# Patient Record
Sex: Female | Born: 1950 | Race: White | Hispanic: No | Marital: Married | Smoking: Never smoker
Health system: Southern US, Community
[De-identification: ages and names within clinical notes are randomized; demographics above are authoritative.]

## PROBLEM LIST (undated history)

## (undated) DIAGNOSIS — E785 Hyperlipidemia, unspecified: Secondary | ICD-10-CM

## (undated) DIAGNOSIS — E119 Type 2 diabetes mellitus without complications: Secondary | ICD-10-CM

## (undated) DIAGNOSIS — K635 Polyp of colon: Secondary | ICD-10-CM

## (undated) DIAGNOSIS — K219 Gastro-esophageal reflux disease without esophagitis: Secondary | ICD-10-CM

## (undated) DIAGNOSIS — L719 Rosacea, unspecified: Secondary | ICD-10-CM

## (undated) DIAGNOSIS — F329 Major depressive disorder, single episode, unspecified: Secondary | ICD-10-CM

## (undated) DIAGNOSIS — F419 Anxiety disorder, unspecified: Secondary | ICD-10-CM

## (undated) DIAGNOSIS — M719 Bursopathy, unspecified: Secondary | ICD-10-CM

## (undated) DIAGNOSIS — M1990 Unspecified osteoarthritis, unspecified site: Secondary | ICD-10-CM

## (undated) DIAGNOSIS — F32A Depression, unspecified: Secondary | ICD-10-CM

## (undated) HISTORY — PX: COLONOSCOPY: SHX174

## (undated) HISTORY — DX: Bursopathy, unspecified: M71.9

## (undated) HISTORY — DX: Type 2 diabetes mellitus without complications: E11.9

## (undated) HISTORY — DX: Anxiety disorder, unspecified: F41.9

## (undated) HISTORY — PX: ABDOMINAL HYSTERECTOMY: SHX81

## (undated) HISTORY — DX: Depression, unspecified: F32.A

## (undated) HISTORY — DX: Gastro-esophageal reflux disease without esophagitis: K21.9

## (undated) HISTORY — PX: BLADDER REPAIR: SHX76

## (undated) HISTORY — DX: Unspecified osteoarthritis, unspecified site: M19.90

## (undated) HISTORY — DX: Hyperlipidemia, unspecified: E78.5

## (undated) HISTORY — PX: BUNIONECTOMY: SHX129

## (undated) HISTORY — PX: BREAST BIOPSY: SHX20

## (undated) HISTORY — DX: Rosacea, unspecified: L71.9

## (undated) HISTORY — PX: DILATION AND CURETTAGE OF UTERUS: SHX78

---
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
## 2005-01-30 HISTORY — PX: COLON RESECTION: SHX5231

## 2016-01-31 DIAGNOSIS — K635 Polyp of colon: Secondary | ICD-10-CM

## 2016-01-31 HISTORY — DX: Polyp of colon: K63.5

## 2017-07-05 LAB — HEMOGLOBIN A1C: Hemoglobin A1C: 5.9

## 2017-07-05 LAB — HEPATIC FUNCTION PANEL
ALT: 26 (ref 7–35)
AST: 18 (ref 13–35)
Alkaline Phosphatase: 80 (ref 25–125)
Bilirubin, Total: 0.4

## 2017-07-05 LAB — LIPID PANEL
Cholesterol: 148 (ref 0–200)
HDL: 70 (ref 35–70)
LDL Cholesterol: 65
Triglycerides: 65 (ref 40–160)

## 2017-07-05 LAB — BASIC METABOLIC PANEL
BUN: 19 (ref 4–21)
Creatinine: 1 (ref <=1.1)
Glucose: 99
Potassium: 4.3 (ref 3.4–5.3)
Sodium: 142 (ref 137–147)

## 2017-07-05 LAB — CBC AND DIFFERENTIAL
HCT: 45 (ref 36–46)
Hemoglobin: 14.6 (ref 12.0–16.0)
Neutrophils Absolute: 4
Platelets: 239 (ref 150–399)
WBC: 6.6

## 2017-07-05 LAB — TSH: TSH: 2.56 (ref <=5.90)

## 2017-07-05 LAB — VITAMIN D 25 HYDROXY (VIT D DEFICIENCY, FRACTURES): Vit D, 25-Hydroxy: 30.7

## 2018-05-14 LAB — BASIC METABOLIC PANEL
BUN: 17 (ref 4–21)
Creatinine: 0.8 (ref <=1.1)
Glucose: 99
Potassium: 4.2 (ref 3.4–5.3)
Sodium: 141 (ref 137–147)

## 2018-05-14 LAB — CBC AND DIFFERENTIAL
HCT: 46 (ref 36–46)
Hemoglobin: 15.2 (ref 12.0–16.0)
Platelets: 219 (ref 150–399)
WBC: 5.4

## 2018-05-14 LAB — LIPID PANEL
Cholesterol: 143 (ref 0–200)
HDL: 78 — AB (ref 35–70)
LDL Cholesterol: 53
Triglycerides: 62 (ref 40–160)

## 2018-05-14 LAB — TSH: TSH: 2.13 (ref <=5.90)

## 2018-05-14 LAB — HEMOGLOBIN A1C: Hemoglobin A1C: 5.8

## 2018-05-14 LAB — HEPATIC FUNCTION PANEL
ALT: 17 (ref 7–35)
AST: 13 (ref 13–35)
Alkaline Phosphatase: 78 (ref 25–125)
Bilirubin, Total: 0.4

## 2018-05-14 LAB — VITAMIN D 25 HYDROXY (VIT D DEFICIENCY, FRACTURES): Vit D, 25-Hydroxy: 45.6

## 2018-06-10 ENCOUNTER — Encounter: Payer: Self-pay | Admitting: Family Medicine

## 2018-06-10 LAB — HM MAMMOGRAPHY

## 2018-08-28 ENCOUNTER — Other Ambulatory Visit: Payer: Self-pay

## 2018-08-28 ENCOUNTER — Encounter: Payer: Self-pay | Admitting: Family Medicine

## 2018-08-28 ENCOUNTER — Ambulatory Visit (INDEPENDENT_AMBULATORY_CARE_PROVIDER_SITE_OTHER): Payer: Medicare Other | Admitting: Family Medicine

## 2018-08-28 DIAGNOSIS — F419 Anxiety disorder, unspecified: Secondary | ICD-10-CM

## 2018-08-28 DIAGNOSIS — Z85828 Personal history of other malignant neoplasm of skin: Secondary | ICD-10-CM | POA: Diagnosis not present

## 2018-08-28 DIAGNOSIS — E118 Type 2 diabetes mellitus with unspecified complications: Secondary | ICD-10-CM

## 2018-08-28 DIAGNOSIS — M2550 Pain in unspecified joint: Secondary | ICD-10-CM

## 2018-08-28 DIAGNOSIS — K219 Gastro-esophageal reflux disease without esophagitis: Secondary | ICD-10-CM

## 2018-08-28 DIAGNOSIS — G4700 Insomnia, unspecified: Secondary | ICD-10-CM

## 2018-08-28 DIAGNOSIS — E785 Hyperlipidemia, unspecified: Secondary | ICD-10-CM

## 2018-08-28 DIAGNOSIS — E1169 Type 2 diabetes mellitus with other specified complication: Secondary | ICD-10-CM

## 2018-08-28 NOTE — Progress Notes (Signed)
Virtual Visit via Video Note  I connected with Renee Bauer   on 08/28/18 at  1:30 PM EDT by a video enabled telemedicine application and verified that I am speaking with the correct person using two identifiers.  Location patient: home Location provider:work office Persons participating in the virtual visit: patient, provider  I discussed the limitations of evaluation and management by telemedicine and the availability of in person appointments. The patient expressed understanding and agreed to proceed.  Video feed not working; visit completed by telephone  Torianne Bauer DOB: Jan 10, 1951 Encounter date: 08/28/2018  This is a 68 y.o. female who presents to establish care. Chief Complaint  Patient presents with  . Establish Care    History of present illness: From [place]. Just moved down here a month ago. Living with daughter (house build is behind schedule due to COVID). Dropped off record release today.   Dealing with a lot of anxiety over current move. Had lived up [place] "forever", lived close to sister (next door), left mom in nursing home, also just retired. Has dealt with depression/anxiety all of life. Has been on things on and off all life, but no meds in last few years. For years husband and her have wanted to move down by daughter to spend time with her and watch grandchild (who is autistic) grow up. Tired of snow.   Didn't even get to say goodbye to mother due to [place]. Now here, no friends.   Not supposed to move in to end of August. Still a lot of anxiety dealing with finalizing house building.   Only way she can sleep at night is if she takes alprazolam. Never been good sleeper. Lexapro, wellbutrin, prozac. Had really bad dry mouth on lexapro. Usually will sleep if she takes one of these. Feels guilty taking something for sleep or mood because she is christian and feels she should be able to work through things with prayer. Really did have issues with a lot of the  medications that she tried.   Last week got to point where she was just worn down with everything. Feels heaviness over her. Felt like she didn't want to live anymore. Tried to commit suicide at age 82. After that everything was strict. Then saw some psychologists which helped, but medications were difficult to tolerate.   Stopped medications when she had surgery a few years ago. About a year ago was going to [employer] which was helpful.   Some nights can sleep through with ambien. Feels like she is lucky if she gets 6 hours sleep.   Other thing she is anxious about is needing to get other medications refilled.   Also needs podiatrist. Goes to them to get toenails clipped; needs new orthotics.   Sees ortho - left knee injections q 3 months.   Follows with derm yearly for skin cancer checks. Has had 2 cancers on face. Not sure what type.   Also follows with rheumatologist. Has osteoarthritis. Takes meloxicam for pain below hip. Started seeing rheumatology due to dry mouth. Thought maybe lupus; but continued seeing rheumatology after this. Was getting injections that they told her she needed yearly. Not sure what this injection was.   DMII: has lost 30lbs in last couple of years. On steglatro which is newer med for her. Last bloodwork was done in April. A1C in April she is not sure about but was told pretty good.   HL: crestor 10mg . Does well with this.   Just had  mammogram.  Had DEXA not too long ago.  (above should be with records once we get these; she has signed release today)    Past Medical History:  Diagnosis Date  . Anxiety   . Benign colon polyp 2018  . Depression   . Diabetes mellitus without complication ([place])   . GERD (gastroesophageal reflux disease)   . Hyperlipidemia   . Rosacea    Past Surgical History:  Procedure Laterality Date  . ABDOMINAL HYSTERECTOMY     menorrhagia  . BLADDER REPAIR    . BUNIONECTOMY Right   . COLON RESECTION  2007   Not on  File Current Meds  Medication Sig  . CALCIUM CITRATE PO Take 600 mg by mouth daily.  . Cholecalciferol (VITAMIN D3 PO) Take 2,000 Units by mouth daily.  . Glucosamine-Chondroitin (GLUCOSAMINE CHONDR COMPLEX PO) Take by mouth.  . TURMERIC CURCUMIN PO Take 500 mg by mouth daily.   Social History   Tobacco Use  . Smoking status: Never Smoker  . Smokeless tobacco: Never Used  Substance Use Topics  . Alcohol use: Yes    Comment: occasional wine   Family History  Problem Relation Age of Onset  . Diabetes Mother   . Arrhythmia Mother   . High blood pressure Mother   . Mesothelioma Father        asbestos exposure  . Healthy Sister   . Brain cancer Maternal Aunt   . Post-traumatic stress disorder Son   . Scoliosis Son      Review of Systems  Constitutional: Negative for chills, fatigue and fever.  Respiratory: Negative for cough, chest tightness, shortness of breath and wheezing.   Cardiovascular: Negative for chest pain, palpitations and leg swelling.  Psychiatric/Behavioral: Positive for decreased concentration and sleep disturbance. Negative for suicidal ideas. The patient is nervous/anxious.     Objective:  There were no vitals taken for this visit.      BP Readings from Last 3 Encounters:  No data found for BP   Wt Readings from Last 3 Encounters:  No data found for Wt    EXAM:  GENERAL: alert, oriented, sounds well and in no acute distress  Lungs: no difficulty with breathing noted during conversation.  PSYCH/NEURO: pleasant and cooperative. She is occasionally tearful on the phone when discussing not being able to say by to her mom, in addition to other stressors going on.  Would not hurt self, but feels tired of all the stress/anxiety.   Assessment/Plan  1. Arthralgia, unspecified joint She has been following with orthopedics and rheumatology in [place].  She is not certain what the diagnosis is that she is following with rheumatology for, but she has  signed record release today so we will try to obtain previous records and can forward these to specialists once received. - Ambulatory referral to Orthopedics - Ambulatory referral to Rheumatology - meloxicam (MOBIC) 7.5 MG tablet; Take 1 tablet (7.5 mg total) by mouth daily as needed for pain.  Dispense: 90 tablet; Refill: 1  2. Controlled type 2 diabetes mellitus with complication, without long-term current use of insulin ([place]) States she has done well on Steglatro.  We will see how recent blood work looks once we receive records. - Ambulatory referral to Podiatry - STEGLATRO 15 MG TABS; Take 15 mg by mouth daily.  Dispense: 90 tablet; Refill: 1  3. History of skin cancer - Ambulatory referral to Dermatology  4. Hyperlipidemia associated with type 2 diabetes mellitus ([place]) - rosuvastatin (CRESTOR)  10 MG tablet; Take 1 tablet (10 mg total) by mouth at bedtime.  Dispense: 90 tablet; Refill: 1  5. Gastroesophageal reflux disease, esophagitis presence not specified - esomeprazole (NEXIUM) 40 MG capsule; Take 1 capsule (40 mg total) by mouth daily.  Dispense: 90 capsule; Refill: 3  6. Anxiety Current anxiety and stress levels are quite high.  She has had to make a lot of adjustments in the last couple months with retiring, moving, living with her daughter, and saying goodbye to mom after having to put her in a nursing home.  She is interested in doing therapy right now, which I think is a good start since she has had some difficulty tolerating medications in the past for mood.  We will start there, but I encouraged her to let me know if any worsening of mood.  We will plan a short-term follow-up to touch base.  She does limit alprazolam use.  She uses only intermittently at bedtime.  We discussed risks of this medication.  I do not wish to prescribe this long-term, but given her current status I am okay with a refill until we touch base. - ALPRAZolam (XANAX) 1 MG tablet; Take 0.5-1 tablets (0.5-1  mg total) by mouth at bedtime as needed for anxiety.  Dispense: 30 tablet; Refill: 0  7. Insomnia, unspecified type Discussed risk of tiredness Ambien that she was taking.  We will send in lower dose for safety reasons.  Cautioned that any dose of Ambien does come with risks.  Since she is used to this medication and does get some sleep on it we will keep it on board for now, but we discussed considering alternatives in the future especially as she ages that would be safer. - zolpidem (AMBIEN CR) 6.25 MG CR tablet; Take 1 tablet (6.25 mg total) by mouth at bedtime as needed for sleep.  Dispense: 90 tablet; Refill: 0    Return in about 2 months (around 10/29/2018) for Chronic condition visit. I will call her once I get previous records.   I discussed the assessment and treatment plan with the patient. The patient was provided an opportunity to ask questions and all were answered. The patient agreed with the plan and demonstrated an understanding of the instructions.   The patient was advised to call back or seek an in-person evaluation if the symptoms worsen or if the condition fails to improve as anticipated.  I provided 35 minutes of non-face-to-face time during this encounter.   Micheline Rough, MD

## 2018-08-29 ENCOUNTER — Telehealth: Payer: Self-pay | Admitting: *Deleted

## 2018-08-29 MED ORDER — ALPRAZOLAM 1 MG PO TABS: 0.5000 mg | ORAL_TABLET | Freq: Every evening | ORAL | 0 refills | Status: DC | PRN

## 2018-08-29 MED ORDER — STEGLATRO 15 MG PO TABS: 15.0000 mg | ORAL_TABLET | Freq: Every day | ORAL | 1 refills | Status: DC

## 2018-08-29 MED ORDER — ROSUVASTATIN CALCIUM 10 MG PO TABS: 10.0000 mg | ORAL_TABLET | Freq: Every day | ORAL | 1 refills | Status: DC

## 2018-08-29 MED ORDER — MELOXICAM 7.5 MG PO TABS: 7.5000 mg | ORAL_TABLET | Freq: Every day | ORAL | 1 refills | Status: DC | PRN

## 2018-08-29 MED ORDER — ESOMEPRAZOLE MAGNESIUM 40 MG PO CPDR: 40.0000 mg | DELAYED_RELEASE_CAPSULE | Freq: Every day | ORAL | 3 refills | Status: DC

## 2018-08-29 MED ORDER — ZOLPIDEM TARTRATE ER 6.25 MG PO TBCR: 6.2500 mg | EXTENDED_RELEASE_TABLET | Freq: Every evening | ORAL | 0 refills | Status: DC | PRN

## 2018-08-29 NOTE — Telephone Encounter (Signed)
I called the pt and offered to give the phone number for [facility].  Patient stated she is at a pool and cannot write the number down, I advised she google the office and she agreed.  Patient stated she will call back for a follow up visit.  PHQ9 and GAD7 entered into the chart.  Message sent to Dr Ethlyn Gallery.

## 2018-08-29 NOTE — Telephone Encounter (Signed)
-----   Message from Caren Macadam, MD sent at 08/29/2018  7:36 AM EDT ----- Please give her therapy numbers we have: she has [facility] medicare but also supplement if that makes difference. I also told her she could call card.#I think 2 month [facility]CV follow up is reasonable to touch base with concerns discussed today#if you have time and could do PHQ9/gad7 send results back to me. If not then we can accomplish this at her follow up visit.

## 2018-08-30 NOTE — Telephone Encounter (Signed)
Noted  

## 2018-09-09 NOTE — Telephone Encounter (Signed)
Copied from [place] [id number]. Topic: Referral - Request for Referral >> Sep 09, 2018 10:26 AM Parke Poisson wrote: Has patient seen PCP for this complaint Yes Referral for which specialty:Ortho Preferred provider/office: No preferance Reason for referral: left knee

## 2018-09-12 ENCOUNTER — Encounter: Payer: Self-pay | Admitting: Rheumatology

## 2018-09-23 ENCOUNTER — Encounter: Payer: Self-pay | Admitting: Family Medicine

## 2018-09-25 ENCOUNTER — Encounter: Payer: Self-pay | Admitting: Family Medicine

## 2018-09-25 NOTE — Telephone Encounter (Signed)
Easiest option:  meloxicam is not expensive and so she could fill locally not on insurance- I can print rx- - runs about $2.50-5 depending on pharmacy ($3 at [facility] for 30 days or $6 for 90 day). So if she is wanting larger supply, this might be easier way to do it.   Doesn't make sense to me to complete prior auth paperwork with above cost of medication being available.

## 2018-09-27 ENCOUNTER — Other Ambulatory Visit: Payer: Self-pay | Admitting: Family Medicine

## 2018-09-27 DIAGNOSIS — M2550 Pain in unspecified joint: Secondary | ICD-10-CM

## 2018-09-27 MED ORDER — MELOXICAM 7.5 MG PO TABS: 7.5000 mg | ORAL_TABLET | Freq: Two times a day (BID) | ORAL | 1 refills | Status: DC | PRN

## 2018-09-30 ENCOUNTER — Encounter: Payer: Self-pay | Admitting: Family Medicine

## 2018-09-30 DIAGNOSIS — M8580 Other specified disorders of bone density and structure, unspecified site: Secondary | ICD-10-CM | POA: Insufficient documentation

## 2018-10-08 ENCOUNTER — Other Ambulatory Visit: Payer: Self-pay

## 2018-10-08 ENCOUNTER — Ambulatory Visit (INDEPENDENT_AMBULATORY_CARE_PROVIDER_SITE_OTHER): Payer: Medicare Other | Admitting: Orthopaedic Surgery

## 2018-10-08 ENCOUNTER — Telehealth: Payer: Self-pay | Admitting: Orthopaedic Surgery

## 2018-10-08 ENCOUNTER — Ambulatory Visit: Payer: Self-pay

## 2018-10-08 ENCOUNTER — Encounter: Payer: Self-pay | Admitting: Orthopaedic Surgery

## 2018-10-08 VITALS — BP 128/87 | HR 74 | Ht 61.0 in | Wt 155.0 lb

## 2018-10-08 DIAGNOSIS — M1712 Unilateral primary osteoarthritis, left knee: Secondary | ICD-10-CM | POA: Diagnosis not present

## 2018-10-08 MED ORDER — BUPIVACAINE HCL 0.25 % IJ SOLN
2.0000 mL | INTRAMUSCULAR | Status: AC | PRN
  Administered 2018-10-08: 2 mL via INTRA_ARTICULAR

## 2018-10-08 MED ORDER — LIDOCAINE HCL 1 % IJ SOLN
2.0000 mL | INTRAMUSCULAR | Status: AC | PRN
  Administered 2018-10-08: 15:00:00 2 mL

## 2018-10-08 MED ORDER — METHYLPREDNISOLONE ACETATE 40 MG/ML IJ SUSP
80.0000 mg | INTRAMUSCULAR | Status: AC | PRN
  Administered 2018-10-08: 80 mg via INTRA_ARTICULAR

## 2018-10-08 NOTE — Telephone Encounter (Signed)
Please precert for left knee visco injections. This is Dr.Whitfield's patient.

## 2018-10-08 NOTE — Progress Notes (Signed)
Office Visit Note   Patient: Renee Bauer           Date of Birth: 04-Nov-1950           MRN: [id number] Visit Date: 10/08/2018              Requested by: Caren Macadam, MD [place],  [place] [id number] PCP: Caren Macadam, MD   Assessment & Plan: Visit Diagnoses:  1. Unilateral primary osteoarthritis, left knee     Plan:  #1: Corticosteroid injection was given without difficulty.  Tolerated procedure well. #2: Instructed her about the use of Voltaren gel. #3: We will have her precertified for Visco supplementation.   #4: Follow back up after Visco precertification.  Follow-Up Instructions: Return if symptoms worsen or fail to improve.   Orders:  Orders Placed This Encounter  Procedures  . XR KNEE 3 VIEW LEFT   No orders of the defined types were placed in this encounter.     Procedures: Large Joint Inj: L knee on 10/08/2018 2:38 PM Indications: pain and diagnostic evaluation Details: 25 G 1.5 in needle, anteromedial approach  Arthrogram: No  Medications: 2 mL lidocaine 1 %; 80 mg methylPREDNISolone acetate 40 MG/ML; 2 mL bupivacaine 0.25 % Outcome: tolerated well, no immediate complications Procedure, treatment alternatives, risks and benefits explained, specific risks discussed. Consent was given by the patient. Immediately prior to procedure a time out was called to verify the correct patient, procedure, equipment, support staff and site/side marked as required. Patient was prepped and draped in the usual sterile fashion.       Clinical Data: No additional findings.   Subjective: Chief Complaint  Patient presents with  . Left Knee - Pain   HPI  Patient presents today for chronic left knee pain. She just moved to the area from [place] and usually gets cortisone injections  every 3 months. She said that her knee hurts all throughout. Her pain is constant, but gets worse with certain movements. She takes Meloxicam for arthritis  pain. Her last cortisone injection was June 2nd of this year.    Review of Systems  Constitutional: Negative for fatigue.  HENT: Negative for ear pain.   Respiratory: Negative for shortness of breath.   Cardiovascular: Negative for leg swelling.  Gastrointestinal: Negative for constipation and diarrhea.  Endocrine: Negative for cold intolerance and heat intolerance.  Genitourinary: Negative for difficulty urinating.  Musculoskeletal: Negative for joint swelling.  Skin: Negative for rash.  Allergic/Immunologic: Negative for food allergies.  Neurological: Negative for weakness.  Hematological: Does not bruise/bleed easily.  Psychiatric/Behavioral: Positive for sleep disturbance.     Objective: Vital Signs: BP 128/87   Pulse 74   Ht 5\' 1"  (1.549 m)   Wt 155 lb (70.3 kg)   BMI 29.29 kg/m   Physical Exam Constitutional:      Appearance: She is well-developed.  Eyes:     Pupils: Pupils are equal, round, and reactive to light.  Pulmonary:     Effort: Pulmonary effort is normal.  Skin:    General: Skin is warm and dry.  Neurological:     Mental Status: She is alert and oriented to person, place, and time.  Psychiatric:        Behavior: Behavior normal.     Ortho Exam  Ortho exam today reveals minimal effusion.  No warmth or erythema.  Patellofemoral crepitance with range of motion.  Some pseudolaxity with valgus stressing but has a good endpoint.  Range of motion from near full extension to 100 degrees of flexion.    Specialty Comments:  No specialty comments available.  Imaging: Xr Knee 3 View Left  Result Date: 10/08/2018 Three-view x-ray of the left knee reveals marked joint space narrowing of the medial compartment with sclerosing of both the tibia and femur.  She also has significant patellofemoral degenerative changes with joint space narrowing also.  Slight translation of the distal femur on the tibia.    PMFS History: Current Outpatient Medications   Medication Sig Dispense Refill  . ALPRAZolam (XANAX) 1 MG tablet Take 0.5-1 tablets (0.5-1 mg total) by mouth at bedtime as needed for anxiety. 30 tablet 0  . CALCIUM CITRATE PO Take 600 mg by mouth daily.    . Cholecalciferol (VITAMIN D3 PO) Take 2,000 Units by mouth daily.    Marland Kitchen esomeprazole (NEXIUM) 40 MG capsule Take 1 capsule (40 mg total) by mouth daily. 90 capsule 3  . fluticasone (FLONASE) 50 MCG/ACT nasal spray     . Glucosamine-Chondroitin (GLUCOSAMINE CHONDR COMPLEX PO) Take by mouth.    . lidocaine-prilocaine (EMLA) cream     . meloxicam (MOBIC) 7.5 MG tablet Take 1 tablet (7.5 mg total) by mouth 2 (two) times daily as needed for pain. 180 tablet 1  . METRONIDAZOLE, TOPICAL, 0.75 % LOTN     . rosuvastatin (CRESTOR) 10 MG tablet Take 1 tablet (10 mg total) by mouth at bedtime. 90 tablet 1  . STEGLATRO 15 MG TABS Take 15 mg by mouth daily. 90 tablet 1  . TURMERIC CURCUMIN PO Take 500 mg by mouth daily.    Marland Kitchen zolpidem (AMBIEN CR) 6.25 MG CR tablet Take 1 tablet (6.25 mg total) by mouth at bedtime as needed for sleep. 90 tablet 0   No current facility-administered medications for this visit.     Patient Active Problem List   Diagnosis Date Noted  . Osteopenia 09/30/2018   Past Medical History:  Diagnosis Date  . Anxiety   . Benign colon polyp 2018  . Depression   . Diabetes mellitus without complication ([place])   . GERD (gastroesophageal reflux disease)   . Hyperlipidemia   . Rosacea     Family History  Problem Relation Age of Onset  . Diabetes Mother   . Arrhythmia Mother   . High blood pressure Mother   . Mesothelioma Father        asbestos exposure  . Healthy Sister   . Brain cancer Maternal Aunt   . Post-traumatic stress disorder Son   . Scoliosis Son     Past Surgical History:  Procedure Laterality Date  . ABDOMINAL HYSTERECTOMY     menorrhagia  . BLADDER REPAIR    . BUNIONECTOMY Right   . COLON RESECTION  2007  . DILATION AND CURETTAGE OF UTERUS      Social History   Occupational History  . Not on file  Tobacco Use  . Smoking status: Never Smoker  . Smokeless tobacco: Never Used  Substance and Sexual Activity  . Alcohol use: Yes    Comment: occasional wine  . Drug use: Never  . Sexual activity: Not on file    Dorsal displacement of the dorsal fragment

## 2018-10-11 NOTE — Telephone Encounter (Signed)
Noted  

## 2018-10-15 ENCOUNTER — Encounter: Payer: Self-pay | Admitting: Family Medicine

## 2018-10-15 ENCOUNTER — Telehealth: Payer: Self-pay

## 2018-10-15 NOTE — Telephone Encounter (Signed)
Submitted VOB for Synvisc series, left knee. 

## 2018-10-22 ENCOUNTER — Other Ambulatory Visit: Payer: Self-pay

## 2018-10-22 ENCOUNTER — Encounter: Payer: Self-pay | Admitting: Podiatry

## 2018-10-22 ENCOUNTER — Other Ambulatory Visit: Payer: Self-pay | Admitting: Podiatry

## 2018-10-22 ENCOUNTER — Ambulatory Visit (INDEPENDENT_AMBULATORY_CARE_PROVIDER_SITE_OTHER): Payer: Medicare Other | Admitting: Podiatry

## 2018-10-22 ENCOUNTER — Ambulatory Visit (INDEPENDENT_AMBULATORY_CARE_PROVIDER_SITE_OTHER): Payer: Medicare Other

## 2018-10-22 VITALS — BP 123/87 | HR 81

## 2018-10-22 DIAGNOSIS — M79672 Pain in left foot: Secondary | ICD-10-CM

## 2018-10-22 DIAGNOSIS — M19071 Primary osteoarthritis, right ankle and foot: Secondary | ICD-10-CM

## 2018-10-22 DIAGNOSIS — R200 Anesthesia of skin: Secondary | ICD-10-CM

## 2018-10-22 DIAGNOSIS — G5752 Tarsal tunnel syndrome, left lower limb: Secondary | ICD-10-CM | POA: Diagnosis not present

## 2018-10-22 DIAGNOSIS — M7752 Other enthesopathy of left foot: Secondary | ICD-10-CM | POA: Diagnosis not present

## 2018-10-22 DIAGNOSIS — M79671 Pain in right foot: Secondary | ICD-10-CM

## 2018-10-22 DIAGNOSIS — M7750 Other enthesopathy of unspecified foot: Secondary | ICD-10-CM

## 2018-10-22 MED ORDER — GABAPENTIN 100 MG PO CAPS: 100.0000 mg | ORAL_CAPSULE | Freq: Three times a day (TID) | ORAL | 1 refills | Status: DC

## 2018-10-23 NOTE — Progress Notes (Signed)
Subjective:  Patient ID: Renee Bauer, female    DOB: 1950-02-29,  MRN: [id number]  Chief Complaint  Patient presents with  . Foot Problem    Right digits 1-3 going numb, 3 months duration. Pt states may be due to injury on right 2nd toe after she fell and bent a pin she had from a surgery.  . Foot Problem    Left medial foot constant tingling, worse when flexed lateral    68 y.o. female presents with the above complaint. Patient with last A1c of 5.8 presents with left tingling/burning sensation to the left foot and numbness to the right digits 1-3 doing numb. She also has secondary complaints of elongated toenails. Denies any history of trauma to both feet. She does have history of surgery to the right first and second digit (likely bunion/hammertoe)   Review of Systems: Negative except as noted in the HPI. Denies N/V/F/Ch.  Past Medical History:  Diagnosis Date  . Anxiety   . Benign colon polyp 2018  . Depression   . Diabetes mellitus without complication ([place])   . GERD (gastroesophageal reflux disease)   . Hyperlipidemia   . Rosacea     Current Outpatient Medications:  .  ALPRAZolam (XANAX) 1 MG tablet, Take 0.5-1 tablets (0.5-1 mg total) by mouth at bedtime as needed for anxiety., Disp: 30 tablet, Rfl: 0 .  CALCIUM CITRATE PO, Take 600 mg by mouth daily., Disp: , Rfl:  .  Cholecalciferol (VITAMIN D3 PO), Take 2,000 Units by mouth daily., Disp: , Rfl:  .  esomeprazole (NEXIUM) 40 MG capsule, Take 1 capsule (40 mg total) by mouth daily., Disp: 90 capsule, Rfl: 3 .  fluticasone (FLONASE) 50 MCG/ACT nasal spray, , Disp: , Rfl:  .  Glucosamine-Chondroitin (GLUCOSAMINE CHONDR COMPLEX PO), Take by mouth., Disp: , Rfl:  .  lidocaine-prilocaine (EMLA) cream, , Disp: , Rfl:  .  meloxicam (MOBIC) 7.5 MG tablet, Take 1 tablet (7.5 mg total) by mouth 2 (two) times daily as needed for pain., Disp: 180 tablet, Rfl: 1 .  METRONIDAZOLE, TOPICAL, 0.75 % LOTN, , Disp: , Rfl:  .  rosuvastatin  (CRESTOR) 10 MG tablet, Take 1 tablet (10 mg total) by mouth at bedtime., Disp: 90 tablet, Rfl: 1 .  STEGLATRO 15 MG TABS, Take 15 mg by mouth daily., Disp: 90 tablet, Rfl: 1 .  TURMERIC CURCUMIN PO, Take 500 mg by mouth daily., Disp: , Rfl:  .  zolpidem (AMBIEN CR) 6.25 MG CR tablet, Take 1 tablet (6.25 mg total) by mouth at bedtime as needed for sleep., Disp: 90 tablet, Rfl: 0 .  gabapentin (NEURONTIN) 100 MG capsule, Take 1 capsule (100 mg total) by mouth 3 (three) times daily., Disp: 90 capsule, Rfl: 1  Social History   Tobacco Use  Smoking Status Never Smoker  Smokeless Tobacco Never Used    Allergies  Allergen Reactions  . Clindamycin Hcl Rash   Objective:   Vitals:   10/22/18 1637  BP: 123/87  Pulse: 81   There is no height or weight on file to calculate BMI. Constitutional Well developed. Well nourished.  Vascular Dorsalis pedis pulses palpable bilaterally. Posterior tibial pulses palpable bilaterally. Capillary refill normal to all digits.  No cyanosis or clubbing noted. Pedal hair growth normal.  Neurologic Normal speech. Oriented to person, place, and time. Right numbness noted to the plantar aspect of the first and second digit. Negative Tinel signs B/L feet   Dermatologic Nails elongated and slightly thickened x 10 No open  wounds. No skin lesions.  Orthopedic: Pain on palpation to the tarsal tunnel. Negative Tinel signs. No pain along the PT tendon or with flexion of the FDL/FHL.    Radiographs: There is no definitive evidence of acute fracture or stress fracture identified at this time. Joint space is maintained. Bone stock within normal limits on the left foot.  Right foot shows previous correction of bunion deformity and hammertoe with hardware intact. 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  Assessment:   1. Pain in left foot   2. Pain in right foot   3. Tarsal tunnel syndrome, left   4. Numbness in feet    Plan:  Patient was evaluated  and treated and all questions answered.   -Radiographs were reviewed. B/L Xray.  See above. -Bilateral toenail trims were performed x 10.  -After obtaining consent, injection of 1.5 cc of 0.5% marcaine and 1.5 cc 1% lidocaine given by Felipa Furnace to the tarsal tunnel of the left foot.  -Pt was instructed to follow up in 3 weeks and to report if the injection gave any relief. If so, will consider getting MRI on the next visit of the left foot. -Ankle brace (Tri-lok) was dispensed to the left foot for tarsal tunnel and ankle stabilization gabapentin 100mg  was prescribed to help alleviate the neuropathic pain.   Return in about 3 weeks (around 11/12/2018).

## 2018-10-24 ENCOUNTER — Encounter: Payer: Self-pay | Admitting: Orthopaedic Surgery

## 2018-10-24 ENCOUNTER — Telehealth: Payer: Self-pay

## 2018-10-24 NOTE — Telephone Encounter (Signed)
Please schedule patient an appointment with Dr. Durward Fortes for gel injection.  Thank you.  Approved for Synvisc series, left knee. Buy & Bill Covered at 100% of the allowed amount No Co-pay No PA required

## 2018-10-29 NOTE — Telephone Encounter (Signed)
LMOM for patient to call office to schedule appt 

## 2018-10-30 ENCOUNTER — Other Ambulatory Visit: Payer: Self-pay

## 2018-10-30 ENCOUNTER — Telehealth (INDEPENDENT_AMBULATORY_CARE_PROVIDER_SITE_OTHER): Payer: Medicare Other | Admitting: Family Medicine

## 2018-10-30 DIAGNOSIS — E785 Hyperlipidemia, unspecified: Secondary | ICD-10-CM

## 2018-10-30 DIAGNOSIS — E1142 Type 2 diabetes mellitus with diabetic polyneuropathy: Secondary | ICD-10-CM | POA: Diagnosis not present

## 2018-10-30 DIAGNOSIS — E1169 Type 2 diabetes mellitus with other specified complication: Secondary | ICD-10-CM

## 2018-10-30 DIAGNOSIS — M1990 Unspecified osteoarthritis, unspecified site: Secondary | ICD-10-CM

## 2018-10-30 DIAGNOSIS — M8580 Other specified disorders of bone density and structure, unspecified site: Secondary | ICD-10-CM

## 2018-10-30 DIAGNOSIS — Z85828 Personal history of other malignant neoplasm of skin: Secondary | ICD-10-CM | POA: Insufficient documentation

## 2018-10-30 DIAGNOSIS — R350 Frequency of micturition: Secondary | ICD-10-CM

## 2018-10-30 DIAGNOSIS — G5793 Unspecified mononeuropathy of bilateral lower limbs: Secondary | ICD-10-CM

## 2018-10-30 DIAGNOSIS — G4700 Insomnia, unspecified: Secondary | ICD-10-CM

## 2018-10-30 DIAGNOSIS — F419 Anxiety disorder, unspecified: Secondary | ICD-10-CM

## 2018-10-30 DIAGNOSIS — E1149 Type 2 diabetes mellitus with other diabetic neurological complication: Secondary | ICD-10-CM | POA: Insufficient documentation

## 2018-10-30 DIAGNOSIS — E538 Deficiency of other specified B group vitamins: Secondary | ICD-10-CM

## 2018-10-30 DIAGNOSIS — Z1159 Encounter for screening for other viral diseases: Secondary | ICD-10-CM

## 2018-10-30 DIAGNOSIS — E559 Vitamin D deficiency, unspecified: Secondary | ICD-10-CM

## 2018-10-30 MED ORDER — ZOLPIDEM TARTRATE ER 6.25 MG PO TBCR: 6.2500 mg | EXTENDED_RELEASE_TABLET | Freq: Every evening | ORAL | 1 refills | Status: DC | PRN

## 2018-10-30 NOTE — Progress Notes (Deleted)
Virtual Visit via Telephone Note  I connected with@ on 10/30/18 at 10:00 AM EDT by telephone and verified that I am speaking with the correct person using two identifiers.   I discussed the limitations, risks, security and privacy concerns of performing an evaluation and management service by telephone and the availability of in person appointments. I also discussed with the patient that there may be a patient responsible charge related to this service. The patient expressed understanding and agreed to proceed.  Location patient: home Location provider: work office Participants present for the call: patient, provider Patient did not have a visit in the prior 7 days to address this/these issue(s).  Scheduled concerns:  Hepatitis C Screening Urine Microalbumin Dexa Scan Pna Vac Low Risk Adult Influenza Vaccine  History of Present Illness: Last visit was virtual establish care 7/29. At that time discussed increased stress with recent move from [place].   We referred to orthopedics as well as rheumatology since she had been following with them regularly for various joint pains.  She has seen Ortho and has had a left knee steroid injection.  She is getting recertified for Visco supplementation.  We referred to podiatry due to diabetes and needing help with nail trimming.  She has seen Dr. Posey Pronto.  She was started on gabapentin to help with neuropathic pain.   We referred to dermatology for history of skin cancer.  We discussed considering therapy to help with ongoing anxiety.  We discussed using lower dose of Ambien to help with sleep since this would be safer due to age.  I did refill her Xanax as well, since she uses this for sleep when anxiety is significant. Observations/Objective: Patient sounds cheerful and well on the phone. I do not appreciate any SOB. Speech and thought processing are grossly intact. Patient reported vitals:  Assessment and Plan:   Follow Up  Instructions:  @   [id number] 5-10 [id number] 11-20 [id number] 21-30 I did not refer this patient for an OV in the next 24 hours for this/these issue(s).  I discussed the assessment and treatment plan with the patient. The patient was provided an opportunity to ask questions and all were answered. The patient agreed with the plan and demonstrated an understanding of the instructions.   The patient was advised to call back or seek an in-person evaluation if the symptoms worsen or if the condition fails to improve as anticipated.  I provided *** minutes of non-face-to-face time during this encounter.   Micheline Rough, MD

## 2018-10-31 ENCOUNTER — Encounter: Payer: Self-pay | Admitting: Orthopaedic Surgery

## 2018-10-31 ENCOUNTER — Ambulatory Visit (INDEPENDENT_AMBULATORY_CARE_PROVIDER_SITE_OTHER): Payer: Medicare Other | Admitting: Orthopaedic Surgery

## 2018-10-31 ENCOUNTER — Other Ambulatory Visit: Payer: Self-pay

## 2018-10-31 ENCOUNTER — Telehealth: Payer: Self-pay | Admitting: *Deleted

## 2018-10-31 DIAGNOSIS — M1712 Unilateral primary osteoarthritis, left knee: Secondary | ICD-10-CM

## 2018-10-31 DIAGNOSIS — M8580 Other specified disorders of bone density and structure, unspecified site: Secondary | ICD-10-CM

## 2018-10-31 MED ORDER — HYLAN G-F 20 16 MG/2ML IX SOSY
16.0000 mg | PREFILLED_SYRINGE | INTRA_ARTICULAR | Status: AC | PRN
  Administered 2018-10-31: 16 mg via INTRA_ARTICULAR

## 2018-10-31 NOTE — Telephone Encounter (Signed)
-----   Message from Caren Macadam, MD sent at 10/30/2018  5:14 PM EDT ----- Please schedule pneumococcal 23 along with upcoming flu shot and bloodwork. She states she had tetanus vaccine (not sure type) in June 2018 if you can put in HM. Please ask her if she would like to change specialty referral for bone density. Rheum here doesn't treat bone density? Can switch to endocrinology if desired? Didn't look like rheum did other things for her besides this.

## 2018-10-31 NOTE — Progress Notes (Signed)
Office Visit Note   Patient: Renee Bauer           Date of Birth: September 30, 1950           MRN: [id number] Visit Date: 10/31/2018              Requested by: Caren Macadam, MD [place],  [place] [id number] PCP: Caren Macadam, MD   Assessment & Plan: Visit Diagnoses:  1. Unilateral primary osteoarthritis, left knee     Plan: First Synvisc injection left knee.  Return weekly for the next 2 weeks to complete the series  Follow-Up Instructions: Return in about 1 week (around 11/07/2018).   Orders:  Orders Placed This Encounter  Procedures  . Large Joint Inj: L knee   No orders of the defined types were placed in this encounter.     Procedures: Large Joint Inj: L knee on 10/31/2018 3:37 PM Indications: pain and joint swelling Details: 25 G 1.5 in needle, anteromedial approach  Arthrogram: No  Medications: 16 mg Hylan 16 MG/2ML Outcome: tolerated well, no immediate complications Procedure, treatment alternatives, risks and benefits explained, specific risks discussed. Consent was given by the patient. Immediately prior to procedure a time out was called to verify the correct patient, procedure, equipment, support staff and site/side marked as required. Patient was prepped and draped in the usual sterile fashion.       Clinical Data: No additional findings.   Subjective: Chief Complaint  Patient presents with  . Left Knee - Follow-up    Synvisc started 10/31/2018  Patient presents today for the first Synvisc injection in her left knee.  No change in symptoms.  HPI  Review of Systems   Objective: Vital Signs: BP 117/82   Pulse 74   Ht 5\' 1"  (1.549 m)   Wt 155 lb (70.3 kg)   BMI 29.29 kg/m   Physical Exam  Ortho Exam left knee was not hot, warm or red.  No effusion.  Predominately medial joint pain.  Prior films demonstrated marked medial joint space narrowing as well as degenerative changes at the patellofemoral joint and the  lateral compartment  Specialty Comments:  No specialty comments available.  Imaging: No results found.   PMFS History: Patient Active Problem List   Diagnosis Date Noted  . Unilateral primary osteoarthritis, left knee 10/31/2018  . Arthritis 10/30/2018  . DM (diabetes mellitus) type II controlled, neurological manifestation ([place]) 10/30/2018  . Neuropathy of both feet 10/30/2018  . Anxiety 10/30/2018  . Insomnia 10/30/2018  . History of skin cancer 10/30/2018  . Osteopenia 09/30/2018   Past Medical History:  Diagnosis Date  . Anxiety   . Benign colon polyp 2018  . Depression   . Diabetes mellitus without complication ([place])   . GERD (gastroesophageal reflux disease)   . Hyperlipidemia   . Rosacea     Family History  Problem Relation Age of Onset  . Diabetes Mother   . Arrhythmia Mother   . High blood pressure Mother   . Mesothelioma Father        asbestos exposure  . Healthy Sister   . Brain cancer Maternal Aunt   . Post-traumatic stress disorder Son   . Scoliosis Son     Past Surgical History:  Procedure Laterality Date  . ABDOMINAL HYSTERECTOMY     menorrhagia  . BLADDER REPAIR    . BUNIONECTOMY Right   . COLON RESECTION  2007  . DILATION AND CURETTAGE OF UTERUS  Social History   Occupational History  . Not on file  Tobacco Use  . Smoking status: Never Smoker  . Smokeless tobacco: Never Used  Substance and Sexual Activity  . Alcohol use: Yes    Comment: occasional wine  . Drug use: Never  . Sexual activity: Not on file

## 2018-10-31 NOTE — Telephone Encounter (Signed)
I called the pt and she stated she already spoke with someone and scheduled the appts.  She stated the tetanus was done on 6/202018, not sure of specific type and this was entered under Td in her chart.  She stated her last bone density was in 2019 and she would like to see an endocrinologist and the message was forwarded to Dr Ethlyn Gallery.

## 2018-10-31 NOTE — Progress Notes (Signed)
Virtual Visit via Telephone Note  I connected with Renee Bauer  on 10/30/18 at 10:00 AM EDT by telephone and verified that I am speaking with the correct person using two identifiers.   I discussed the limitations, risks, security and privacy concerns of performing an evaluation and management service by telephone and the availability of in person appointments. I also discussed with the patient that there may be a patient responsible charge related to this service. The patient expressed understanding and agreed to proceed.  Location patient: home Location provider: work office Participants present for the call: patient, provider Patient did not have a visit in the prior 7 days to address this/these issue(s).  Scheduled concerns:  Hepatitis C Screening Urine Microalbumin Dexa Scan Pna Vac Low Risk Adult Influenza Vaccine  History of Present Illness:  Last visit was virtual establish care 7/29. At that time discussed increased stress with recent move from [place]. Still not in new house. Hoping to get in there in October. Does feel like she will feel better once she is in her own space and is done with decisions and stress of building.   We referred to orthopedics as well as rheumatology since she had been following with them regularly for various joint pains and then seeing rheumatology for reclast injections/bone density monitoring (getting reclast every 2 years per last note). .  She has seen Ortho and has had a left knee steroid injection.  She is getting recertified for Visco supplementation. Knee has been doing pretty well since shot. Got call yesterday that insurance approved gel shot.   We referred to podiatry due to diabetes and needing help with nail trimming.  She has seen Dr. Posey Pronto.  She was started on gabapentin to help with neuropathic pain. States that a few weeks ago was having a lot of foot pain. Right foot going numb. Left foot below ankle tingling. A lot of tingling all  the time. Tries to go to sleep and having numbness/pain. Read side effect of steglatro and worried about amputation, so stopped taking this. Not been checking blood sugars. Was told in past she was  More borderline. Just started the gabapentin.    We referred to dermatology for history of skin cancer. Has appointment in October.   We discussed considering therapy to help with ongoing anxiety.  We discussed using lower dose of Ambien to help with sleep since this would be safer due to age.  I did refill her Xanax as well, since she uses this for sleep when anxiety is significant, but she stopped taking this after our last discussion. Was difficult for her to stop, but sleeping 6 hours usually. Has decreased ambien to just once a week.   Energy level has been good. Joined a gym and working out 3 days/week. Trying to walk an hour/day.   Last colonoscopy 05/2016: found a few diverticula and had polyp biopsy. Was supposed to repeat in 5 years. Couple of weeks ago was constipated, then had bad episode diarrhea. Then happened again.   Observations/Objective: Patient sounds cheerful and well on the phone. I do not appreciate any SOB. Speech and thought processing are grossly intact. Patient reported vitals:  Assessment and Plan:  1. Arthritis Following with ortho; has upcoming appointment with rheumatology as well.   2. Controlled type 2 diabetes mellitus with diabetic polyneuropathy, without long-term current use of insulin ([place]) Will start with bloodwork and then determine if she needs to restart any medications.  - Hemoglobin A1c; Future -  Comprehensive metabolic panel; Future - Microalbumin / creatinine urine ratio; Future  3. Neuropathy of both feet Taking gabapentin currently - CBC with Differential/Platelet; Future - Magnesium, RBC; Future  4. Anxiety Stable; has taken self off xanax which is great.  5. Insomnia, unspecified type Sleeping better. Intermittent ambien use still, but  much les frequent.  6. History of skin cancer Seeing dermatology.  7. Urinary frequency Will check urine with upcoming bloodwork. - Urinalysis; Future  8. Hyperlipidemia associated with type 2 diabetes mellitus ([place]) - Lipid panel; Future - TSH; Future  9. B12 deficiency - Vitamin B12; Future  10. Vitamin D deficiency - VITAMIN D 25 Hydroxy (Vit-D Deficiency, Fractures); Future  11. Encounter for hepatitis C screening test for low risk patient - Hep C Antibody; Future  12. Osteopenia: has been getting reclast injections every other year. Followed by rheumatology in [place]. We will see if she would like to change specialists to be seeing someone specifically for bone density. Last DEXA was 02/17/17: L1-L4 T score -0.6; L fem neck -1.3, R fem neck -1.3. hx of taking boniva. We will see if she wants to see another specialist for monitoring since after calling our rheumatologists here do not regularly treat bone density issues.   Follow Up Instructions: Return for pending bloodwork results.  I did not refer this patient for an OV in the next 24 hours for this/these issue(s).  I discussed the assessment and treatment plan with the patient. The patient was provided an opportunity to ask questions and all were answered. The patient agreed with the plan and demonstrated an understanding of the instructions.   The patient was advised to call back or seek an in-person evaluation if the symptoms worsen or if the condition fails to improve as anticipated.  I provided 35 minutes of non-face-to-face time during this encounter.   Micheline Rough, MD

## 2018-11-01 NOTE — Telephone Encounter (Signed)
Referral placed.

## 2018-11-01 NOTE — Telephone Encounter (Signed)
Please put in endo referral. thanks

## 2018-11-04 ENCOUNTER — Other Ambulatory Visit (INDEPENDENT_AMBULATORY_CARE_PROVIDER_SITE_OTHER): Payer: Medicare Other

## 2018-11-04 ENCOUNTER — Other Ambulatory Visit: Payer: Self-pay

## 2018-11-04 DIAGNOSIS — E559 Vitamin D deficiency, unspecified: Secondary | ICD-10-CM | POA: Diagnosis not present

## 2018-11-04 DIAGNOSIS — E1169 Type 2 diabetes mellitus with other specified complication: Secondary | ICD-10-CM | POA: Diagnosis not present

## 2018-11-04 DIAGNOSIS — R350 Frequency of micturition: Secondary | ICD-10-CM

## 2018-11-04 DIAGNOSIS — E785 Hyperlipidemia, unspecified: Secondary | ICD-10-CM

## 2018-11-04 DIAGNOSIS — E538 Deficiency of other specified B group vitamins: Secondary | ICD-10-CM | POA: Diagnosis not present

## 2018-11-04 DIAGNOSIS — E1142 Type 2 diabetes mellitus with diabetic polyneuropathy: Secondary | ICD-10-CM

## 2018-11-04 DIAGNOSIS — G5793 Unspecified mononeuropathy of bilateral lower limbs: Secondary | ICD-10-CM | POA: Diagnosis not present

## 2018-11-04 DIAGNOSIS — Z1159 Encounter for screening for other viral diseases: Secondary | ICD-10-CM

## 2018-11-04 DIAGNOSIS — Z23 Encounter for immunization: Secondary | ICD-10-CM

## 2018-11-04 LAB — URINALYSIS
Bilirubin Urine: NEGATIVE
Hgb urine dipstick: NEGATIVE
Ketones, ur: NEGATIVE
Leukocytes,Ua: NEGATIVE
Nitrite: NEGATIVE
Specific Gravity, Urine: 1.02 (ref 1.000–1.030)
Total Protein, Urine: NEGATIVE
Urine Glucose: NEGATIVE
Urobilinogen, UA: 0.2 (ref 0.0–1.0)
pH: 6 (ref 5.0–8.0)

## 2018-11-04 LAB — COMPREHENSIVE METABOLIC PANEL
ALT: 9 U/L (ref 0–35)
AST: 12 U/L (ref 0–37)
Albumin: 4.2 g/dL (ref 3.5–5.2)
Alkaline Phosphatase: 67 U/L (ref 39–117)
BUN: 12 mg/dL (ref 6–23)
CO2: 30 mEq/L (ref 19–32)
Calcium: 9.5 mg/dL (ref 8.4–10.5)
Chloride: 104 mEq/L (ref 96–112)
Creatinine, Ser: 0.84 mg/dL (ref 0.40–1.20)
GFR: 67.8 mL/min (ref >60.00)
Glucose, Bld: 86 mg/dL (ref 70–99)
Potassium: 3.8 mEq/L (ref 3.5–5.1)
Sodium: 140 mEq/L (ref 135–145)
Total Bilirubin: 0.6 mg/dL (ref 0.2–1.2)
Total Protein: 6.4 g/dL (ref 6.0–8.3)

## 2018-11-04 LAB — MICROALBUMIN / CREATININE URINE RATIO
Creatinine,U: 203.8 mg/dL
Microalb Creat Ratio: 0.8 mg/g (ref 0.0–30.0)
Microalb, Ur: 1.5 mg/dL (ref 0.0–1.9)

## 2018-11-04 LAB — VITAMIN B12: Vitamin B-12: 143 pg/mL — ABNORMAL LOW (ref 211–911)

## 2018-11-04 LAB — CBC WITH DIFFERENTIAL/PLATELET
Basophils Absolute: 0 10*3/uL (ref 0.0–0.1)
Basophils Relative: 0.8 % (ref 0.0–3.0)
Eosinophils Absolute: 0.1 10*3/uL (ref 0.0–0.7)
Eosinophils Relative: 2.4 % (ref 0.0–5.0)
HCT: 43.8 % (ref 36.0–46.0)
Hemoglobin: 14.5 g/dL (ref 12.0–15.0)
Lymphocytes Relative: 27.4 % (ref 12.0–46.0)
Lymphs Abs: 1.6 10*3/uL (ref 0.7–4.0)
MCHC: 33.1 g/dL (ref 30.0–36.0)
MCV: 89.5 fl (ref 78.0–100.0)
Monocytes Absolute: 0.4 10*3/uL (ref 0.1–1.0)
Monocytes Relative: 6.3 % (ref 3.0–12.0)
Neutro Abs: 3.6 10*3/uL (ref 1.4–7.7)
Neutrophils Relative %: 63.1 % (ref 43.0–77.0)
Platelets: 206 10*3/uL (ref 150.0–400.0)
RBC: 4.89 Mil/uL (ref 3.87–5.11)
RDW: 13.2 % (ref 11.5–15.5)
WBC: 5.8 10*3/uL (ref 4.0–10.5)

## 2018-11-04 LAB — LIPID PANEL
Cholesterol: 139 mg/dL (ref 0–200)
HDL: 62.8 mg/dL (ref >39.00)
LDL Cholesterol: 62 mg/dL (ref 0–99)
NonHDL: 75.92
Total CHOL/HDL Ratio: 2
Triglycerides: 68 mg/dL (ref 0.0–149.0)
VLDL: 13.6 mg/dL (ref 0.0–40.0)

## 2018-11-04 LAB — TSH: TSH: 1.84 u[IU]/mL (ref 0.35–4.50)

## 2018-11-04 LAB — HEMOGLOBIN A1C: Hgb A1c MFr Bld: 6.1 % (ref 4.6–6.5)

## 2018-11-04 LAB — VITAMIN D 25 HYDROXY (VIT D DEFICIENCY, FRACTURES): VITD: 65.81 ng/mL (ref 30.00–100.00)

## 2018-11-05 ENCOUNTER — Encounter: Payer: Self-pay | Admitting: Family Medicine

## 2018-11-05 NOTE — Addendum Note (Signed)
Addended by: Agnes Lawrence on: 11/05/2018 03:39 PM   Modules accepted: Orders

## 2018-11-07 ENCOUNTER — Other Ambulatory Visit: Payer: Self-pay

## 2018-11-07 ENCOUNTER — Encounter: Payer: Self-pay | Admitting: Family Medicine

## 2018-11-07 ENCOUNTER — Ambulatory Visit (INDEPENDENT_AMBULATORY_CARE_PROVIDER_SITE_OTHER): Payer: Medicare Other | Admitting: Orthopaedic Surgery

## 2018-11-07 ENCOUNTER — Encounter: Payer: Self-pay | Admitting: Orthopaedic Surgery

## 2018-11-07 ENCOUNTER — Ambulatory Visit: Payer: Medicare Other | Admitting: Orthopaedic Surgery

## 2018-11-07 VITALS — BP 134/86 | HR 64 | Ht 61.0 in | Wt 155.0 lb

## 2018-11-07 DIAGNOSIS — M1712 Unilateral primary osteoarthritis, left knee: Secondary | ICD-10-CM

## 2018-11-07 LAB — HEPATITIS C ANTIBODY
Hepatitis C Ab: NONREACTIVE
SIGNAL TO CUT-OFF: 0.01 (ref <1.00)

## 2018-11-07 LAB — MAGNESIUM, RBC: Magnesium RBC: 5 mg/dL (ref 4.0–6.4)

## 2018-11-07 MED ORDER — HYLAN G-F 20 16 MG/2ML IX SOSY
16.0000 mg | PREFILLED_SYRINGE | INTRA_ARTICULAR | Status: AC | PRN
  Administered 2018-11-07: 16 mg via INTRA_ARTICULAR

## 2018-11-07 NOTE — Progress Notes (Signed)
Office Visit Note   Patient: Renee Bauer           Date of Birth: 1950/09/28           MRN: [id number] Visit Date: 11/07/2018              Requested by: Caren Macadam, MD [place],  [place] [id number] PCP: Caren Macadam, MD   Assessment & Plan: Visit Diagnoses:  1. Unilateral primary osteoarthritis, left knee     Plan: Second Synvisc injection left knee.  No problems with the first.  Return in 1 week to complete the series of 3  Follow-Up Instructions: Return in about 1 week (around 11/14/2018).   Orders:  Orders Placed This Encounter  Procedures  . Large Joint Inj: L knee   No orders of the defined types were placed in this encounter.     Procedures: Large Joint Inj: L knee on 11/07/2018 10:07 AM Indications: pain and joint swelling Details: 25 G 1.5 in needle, anteromedial approach  Arthrogram: No  Medications: 16 mg Hylan 16 MG/2ML Outcome: tolerated well, no immediate complications Procedure, treatment alternatives, risks and benefits explained, specific risks discussed. Consent was given by the patient. Immediately prior to procedure a time out was called to verify the correct patient, procedure, equipment, support staff and site/side marked as required. Patient was prepped and draped in the usual sterile fashion.       Clinical Data: No additional findings.   Subjective: Chief Complaint  Patient presents with  . Left Knee - Follow-up    Synvisc started 10/31/2018  Patient presents today for the second synvisc injection in the left knee. She started the injections on 10/31/2018. Patient states that she is doing well.   HPI  Review of Systems  Constitutional: Negative for fatigue.  HENT: Negative for ear pain.   Eyes: Negative for pain.  Respiratory: Negative for shortness of breath.   Cardiovascular: Negative for leg swelling.  Gastrointestinal: Negative for constipation and diarrhea.  Endocrine: Negative for cold  intolerance and heat intolerance.  Genitourinary: Negative for difficulty urinating.  Musculoskeletal: Negative for joint swelling.  Skin: Negative for rash.  Allergic/Immunologic: Negative for food allergies.  Neurological: Negative for weakness.  Hematological: Does not bruise/bleed easily.  Psychiatric/Behavioral: Negative for sleep disturbance.     Objective: Vital Signs: BP 134/86   Pulse 64   Ht 5\' 1"  (1.549 m)   Wt 155 lb (70.3 kg)   BMI 29.29 kg/m   Physical Exam  Ortho Exam left knee was not hot red warm or swollen.  Full range of motion.  Walks without a limp  Specialty Comments:  No specialty comments available.  Imaging: No results found.   PMFS History: Patient Active Problem List   Diagnosis Date Noted  . Unilateral primary osteoarthritis, left knee 10/31/2018  . Arthritis 10/30/2018  . DM (diabetes mellitus) type II controlled, neurological manifestation ([place]) 10/30/2018  . Neuropathy of both feet 10/30/2018  . Anxiety 10/30/2018  . Insomnia 10/30/2018  . History of skin cancer 10/30/2018  . Osteopenia 09/30/2018   Past Medical History:  Diagnosis Date  . Anxiety   . Benign colon polyp 2018  . Depression   . Diabetes mellitus without complication ([place])   . GERD (gastroesophageal reflux disease)   . Hyperlipidemia   . Rosacea     Family History  Problem Relation Age of Onset  . Diabetes Mother   . Arrhythmia Mother   . High blood  pressure Mother   . Mesothelioma Father        asbestos exposure  . Healthy Sister   . Brain cancer Maternal Aunt   . Post-traumatic stress disorder Son   . Scoliosis Son     Past Surgical History:  Procedure Laterality Date  . ABDOMINAL HYSTERECTOMY     menorrhagia  . BLADDER REPAIR    . BUNIONECTOMY Right   . COLON RESECTION  2007  . DILATION AND CURETTAGE OF UTERUS     Social History   Occupational History  . Not on file  Tobacco Use  . Smoking status: Never Smoker  . Smokeless tobacco: Never  Used  Substance and Sexual Activity  . Alcohol use: Yes    Comment: occasional wine  . Drug use: Never  . Sexual activity: Not on file

## 2018-11-12 ENCOUNTER — Other Ambulatory Visit: Payer: Self-pay

## 2018-11-12 ENCOUNTER — Ambulatory Visit (INDEPENDENT_AMBULATORY_CARE_PROVIDER_SITE_OTHER): Payer: Medicare Other | Admitting: Podiatry

## 2018-11-12 DIAGNOSIS — G5752 Tarsal tunnel syndrome, left lower limb: Secondary | ICD-10-CM | POA: Diagnosis not present

## 2018-11-12 DIAGNOSIS — R200 Anesthesia of skin: Secondary | ICD-10-CM | POA: Diagnosis not present

## 2018-11-12 DIAGNOSIS — M79672 Pain in left foot: Secondary | ICD-10-CM | POA: Diagnosis not present

## 2018-11-13 ENCOUNTER — Encounter: Payer: Self-pay | Admitting: Podiatry

## 2018-11-13 ENCOUNTER — Telehealth: Payer: Self-pay | Admitting: *Deleted

## 2018-11-13 DIAGNOSIS — G5752 Tarsal tunnel syndrome, left lower limb: Secondary | ICD-10-CM

## 2018-11-13 DIAGNOSIS — M79672 Pain in left foot: Secondary | ICD-10-CM

## 2018-11-13 DIAGNOSIS — R200 Anesthesia of skin: Secondary | ICD-10-CM

## 2018-11-13 NOTE — Progress Notes (Signed)
Subjective:  Patient ID: Renee Bauer, female    DOB: [birth year],  MRN: [id number]  Chief Complaint  Patient presents with  . Foot Pain    pt is here for a f/u of bil foot pain, pt states that she is doing a lot better, with minimum tingling, which is mostly present at night time    68 y.o. female presents with the above complaint.  Patient states that her right foot pain has resolved.  However her left foot pain with the injection given to her last time has helped considerably in terms of decreasing the pain.  I am still concerned that she has tarsal tunnel syndrome and given that the injection actually helped considerably is likely her diagnosis.  She states that the ankle brace has been helping.  And the gabapentin has also been helping her considerably.   Review of Systems: Negative except as noted in the HPI. Denies N/V/F/Ch.  Past Medical History:  Diagnosis Date  . Anxiety   . Benign colon polyp 2018  . Depression   . Diabetes mellitus without complication ([place])   . GERD (gastroesophageal reflux disease)   . Hyperlipidemia   . Rosacea     Current Outpatient Medications:  .  CALCIUM CITRATE PO, Take 600 mg by mouth daily., Disp: , Rfl:  .  Cholecalciferol (VITAMIN D3 PO), Take 2,000 Units by mouth daily., Disp: , Rfl:  .  esomeprazole (NEXIUM) 40 MG capsule, Take 1 capsule (40 mg total) by mouth daily., Disp: 90 capsule, Rfl: 3 .  fluticasone (FLONASE) 50 MCG/ACT nasal spray, , Disp: , Rfl:  .  gabapentin (NEURONTIN) 100 MG capsule, Take 1 capsule (100 mg total) by mouth 3 (three) times daily., Disp: 90 capsule, Rfl: 1 .  Glucosamine-Chondroitin (GLUCOSAMINE CHONDR COMPLEX PO), Take by mouth., Disp: , Rfl:  .  lidocaine-prilocaine (EMLA) cream, , Disp: , Rfl:  .  meloxicam (MOBIC) 7.5 MG tablet, Take 1 tablet (7.5 mg total) by mouth 2 (two) times daily as needed for pain., Disp: 180 tablet, Rfl: 1 .  METRONIDAZOLE, TOPICAL, 0.75 % LOTN, , Disp: , Rfl:  .  rosuvastatin  (CRESTOR) 10 MG tablet, Take 1 tablet (10 mg total) by mouth at bedtime., Disp: 90 tablet, Rfl: 1 .  TURMERIC CURCUMIN PO, Take 500 mg by mouth daily., Disp: , Rfl:  .  zolpidem (AMBIEN CR) 6.25 MG CR tablet, Take 1 tablet (6.25 mg total) by mouth at bedtime as needed for sleep., Disp: 90 tablet, Rfl: 1  Social History   Tobacco Use  Smoking Status Never Smoker  Smokeless Tobacco Never Used    Allergies  Allergen Reactions  . Clindamycin Hcl Rash   Objective:  There were no vitals filed for this visit. There is no height or weight on file to calculate BMI. Constitutional Well developed. Well nourished.  Vascular Dorsalis pedis pulses palpable bilaterally. Posterior tibial pulses palpable bilaterally. Capillary refill normal to all digits.  No cyanosis or clubbing noted. Pedal hair growth normal.  Neurologic Normal speech. Oriented to person, place, and time. Positive Tinel's sign with shooting pain down to her digits.  Increased pain in the tarsal tunnel region on forced eversion of her foot.  Dermatologic Nails well groomed and normal in appearance. No open wounds. No skin lesions.  Orthopedic:  Pain on palpation to the left tarsal tunnel region.   Radiographs: None Assessment:  No diagnosis found. Plan:  Patient was evaluated and treated and all questions answered.  Left foot tarsal tunnel  syndrome -I explained and educated the patient on the etiology of the tarsal tunnel syndrome and all the signs that are associated with it.  Given that the injection helped mitigate her symptoms her diagnosis is likely in the impingement in the tarsal tunnel region. -At this point I recommended that she obtain a left foot ankle MRI to look for impingement or any other soft tissue growth that is compressing on the nerve. -She will continue to wear ankle braces. Mateo Flow reach out to the patient to schedule a left foot MRI.  No follow-ups on file.

## 2018-11-13 NOTE — Telephone Encounter (Signed)
-----   Message from Felipa Furnace, DPM sent at 11/12/2018  3:37 PM EDT ----- Reina Fuse,   Can you please order a left foot/ankle MRI to evaluate and rule out tarsal tunnel syndrome or impingement  Thanks Lennette Bihari

## 2018-11-13 NOTE — Telephone Encounter (Signed)
Orders faxed to [facility]. 

## 2018-11-14 ENCOUNTER — Ambulatory Visit (INDEPENDENT_AMBULATORY_CARE_PROVIDER_SITE_OTHER): Payer: Medicare Other | Admitting: Orthopaedic Surgery

## 2018-11-14 ENCOUNTER — Ambulatory Visit: Payer: Medicare Other

## 2018-11-14 ENCOUNTER — Other Ambulatory Visit: Payer: Self-pay

## 2018-11-14 ENCOUNTER — Encounter: Payer: Self-pay | Admitting: Orthopaedic Surgery

## 2018-11-14 VITALS — BP 110/69 | HR 67 | Ht 61.0 in | Wt 155.0 lb

## 2018-11-14 DIAGNOSIS — M1712 Unilateral primary osteoarthritis, left knee: Secondary | ICD-10-CM | POA: Diagnosis not present

## 2018-11-14 MED ORDER — HYLAN G-F 20 16 MG/2ML IX SOSY
16.0000 mg | PREFILLED_SYRINGE | INTRA_ARTICULAR | Status: AC | PRN
  Administered 2018-11-14: 16 mg via INTRA_ARTICULAR

## 2018-11-14 NOTE — Progress Notes (Signed)
Office Visit Note   Patient: Renee Bauer           Date of Birth: 06-23-1950           MRN: [id number] Visit Date: 11/14/2018              Requested by: Caren Macadam, MD [place],  [place] [id number] PCP: Caren Macadam, MD   Assessment & Plan: Visit Diagnoses:  1. Unilateral primary osteoarthritis, left knee     Plan: Third Synvisc injection left knee.  Doing well  Follow-Up Instructions: Return if symptoms worsen or fail to improve.   Orders:  Orders Placed This Encounter  Procedures  . Large Joint Inj: L knee   No orders of the defined types were placed in this encounter.     Procedures: Large Joint Inj: L knee on 11/14/2018 1:46 PM Indications: pain and joint swelling Details: 25 G 1.5 in needle, anteromedial approach  Arthrogram: No  Medications: 16 mg Hylan 16 MG/2ML Outcome: tolerated well, no immediate complications Procedure, treatment alternatives, risks and benefits explained, specific risks discussed. Consent was given by the patient. Immediately prior to procedure a time out was called to verify the correct patient, procedure, equipment, support staff and site/side marked as required. Patient was prepped and draped in the usual sterile fashion.       Clinical Data: No additional findings.   Subjective: Chief Complaint  Patient presents with  . Left Knee - Follow-up    Synvisc started 10/31/2018  Patient presents today for the third synvisc injection in her left knee. She started the series on 10/31/2018. Patient states that she has already noticed improvement in her left knee.   HPI  Review of Systems  Constitutional: Negative for fatigue.  HENT: Negative for ear pain.   Eyes: Negative for pain.  Respiratory: Negative for shortness of breath.   Cardiovascular: Negative for leg swelling.  Gastrointestinal: Negative for constipation and diarrhea.  Endocrine: Negative for cold intolerance and heat intolerance.   Genitourinary: Negative for difficulty urinating.  Musculoskeletal: Negative for joint swelling.  Skin: Negative for rash.  Allergic/Immunologic: Negative for food allergies.  Neurological: Negative for weakness.  Hematological: Does not bruise/bleed easily.  Psychiatric/Behavioral: Positive for sleep disturbance.     Objective: Vital Signs: BP 110/69   Pulse 67   Ht 5\' 1"  (1.549 m)   Wt 155 lb (70.3 kg)   BMI 29.29 kg/m   Physical Exam  Ortho Exam left knee was not hot red warm or swollen.  Walks without a limp  Specialty Comments:  No specialty comments available.  Imaging: No results found.   PMFS History: Patient Active Problem List   Diagnosis Date Noted  . Unilateral primary osteoarthritis, left knee 10/31/2018  . Arthritis 10/30/2018  . DM (diabetes mellitus) type II controlled, neurological manifestation ([place]) 10/30/2018  . Neuropathy of both feet 10/30/2018  . Anxiety 10/30/2018  . Insomnia 10/30/2018  . History of skin cancer 10/30/2018  . Osteopenia 09/30/2018   Past Medical History:  Diagnosis Date  . Anxiety   . Benign colon polyp 2018  . Depression   . Diabetes mellitus without complication ([place])   . GERD (gastroesophageal reflux disease)   . Hyperlipidemia   . Rosacea     Family History  Problem Relation Age of Onset  . Diabetes Mother   . Arrhythmia Mother   . High blood pressure Mother   . Mesothelioma Father  asbestos exposure  . Healthy Sister   . Brain cancer Maternal Aunt   . Post-traumatic stress disorder Son   . Scoliosis Son     Past Surgical History:  Procedure Laterality Date  . ABDOMINAL HYSTERECTOMY     menorrhagia  . BLADDER REPAIR    . BUNIONECTOMY Right   . COLON RESECTION  2007  . DILATION AND CURETTAGE OF UTERUS     Social History   Occupational History  . Not on file  Tobacco Use  . Smoking status: Never Smoker  . Smokeless tobacco: Never Used  Substance and Sexual Activity  . Alcohol use: Yes     Comment: occasional wine  . Drug use: Never  . Sexual activity: Not on file

## 2018-11-29 ENCOUNTER — Encounter: Payer: Self-pay | Admitting: Podiatry

## 2018-12-03 ENCOUNTER — Other Ambulatory Visit: Payer: Self-pay | Admitting: Podiatry

## 2018-12-10 ENCOUNTER — Other Ambulatory Visit: Payer: Self-pay

## 2018-12-10 ENCOUNTER — Ambulatory Visit
Admission: RE | Admit: 2018-12-10 | Discharge: 2018-12-10 | Disposition: A | Discharge status: Home / Self Care | Payer: Medicare Other | Source: Ambulatory Visit | Attending: Podiatry | Admitting: Podiatry

## 2018-12-11 ENCOUNTER — Encounter: Payer: Self-pay | Admitting: Podiatry

## 2018-12-11 ENCOUNTER — Encounter: Payer: Self-pay | Admitting: Family Medicine

## 2018-12-11 ENCOUNTER — Ambulatory Visit (INDEPENDENT_AMBULATORY_CARE_PROVIDER_SITE_OTHER): Payer: Medicare Other | Admitting: Podiatry

## 2018-12-11 ENCOUNTER — Other Ambulatory Visit: Payer: Self-pay

## 2018-12-11 DIAGNOSIS — M79672 Pain in left foot: Secondary | ICD-10-CM

## 2018-12-11 DIAGNOSIS — R200 Anesthesia of skin: Secondary | ICD-10-CM

## 2018-12-11 DIAGNOSIS — G5752 Tarsal tunnel syndrome, left lower limb: Secondary | ICD-10-CM

## 2018-12-11 MED ORDER — GABAPENTIN 100 MG PO CAPS: 100.0000 mg | ORAL_CAPSULE | Freq: Three times a day (TID) | ORAL | 11 refills | Status: DC

## 2018-12-11 NOTE — Progress Notes (Signed)
Subjective:  Patient ID: Renee Bauer, female    DOB: Mar 27, 1950,  MRN: [id number]  Chief Complaint  Patient presents with  . Foot Problem    pt is here for a f/u of foot pain left, pt states that she is doing alot better since the last time she was here, pt also got an MRI since the last visit    68 y.o. female presents with the above complaint.  Patient is here for follow-up after 4 weeks for left tarsal tunnel syndrome.  Patient states the injection and the gabapentin has helped considerably.  She states that she is also had an MRI done of the left foot and for which she is here to get a reviewed.  She also states that the ankle brace has also been helping.  She also has custom-made orthotics from her previous podiatrist which are in good shape and form.  I explained to her that if she needs to get a refurbished or redone she can come back and see me in the near future.  She denies any other acute complaints.   Review of Systems: Negative except as noted in the HPI. Denies N/V/F/Ch.  Past Medical History:  Diagnosis Date  . Anxiety   . Benign colon polyp 2018  . Depression   . Diabetes mellitus without complication ([place])   . GERD (gastroesophageal reflux disease)   . Hyperlipidemia   . Rosacea     Current Outpatient Medications:  .  CALCIUM CITRATE PO, Take 600 mg by mouth daily., Disp: , Rfl:  .  Cholecalciferol (VITAMIN D3 PO), Take 2,000 Units by mouth daily., Disp: , Rfl:  .  esomeprazole (NEXIUM) 40 MG capsule, Take 1 capsule (40 mg total) by mouth daily., Disp: 90 capsule, Rfl: 3 .  fluticasone (FLONASE) 50 MCG/ACT nasal spray, , Disp: , Rfl:  .  gabapentin (NEURONTIN) 100 MG capsule, Take 1 capsule (100 mg total) by mouth 3 (three) times daily., Disp: 90 capsule, Rfl: 11 .  Glucosamine-Chondroitin (GLUCOSAMINE CHONDR COMPLEX PO), Take by mouth., Disp: , Rfl:  .  lidocaine-prilocaine (EMLA) cream, , Disp: , Rfl:  .  meloxicam (MOBIC) 7.5 MG tablet, Take 1 tablet (7.5 mg  total) by mouth 2 (two) times daily as needed for pain., Disp: 180 tablet, Rfl: 1 .  METRONIDAZOLE, TOPICAL, 0.75 % LOTN, , Disp: , Rfl:  .  rosuvastatin (CRESTOR) 10 MG tablet, Take 1 tablet (10 mg total) by mouth at bedtime., Disp: 90 tablet, Rfl: 1 .  TURMERIC CURCUMIN PO, Take 500 mg by mouth daily., Disp: , Rfl:  .  zolpidem (AMBIEN CR) 6.25 MG CR tablet, Take 1 tablet (6.25 mg total) by mouth at bedtime as needed for sleep., Disp: 90 tablet, Rfl: 1  Social History   Tobacco Use  Smoking Status Never Smoker  Smokeless Tobacco Never Used    Allergies  Allergen Reactions  . Clindamycin Hcl Rash   Objective:  There were no vitals filed for this visit. There is no height or weight on file to calculate BMI. Constitutional Well developed. Well nourished.  Vascular Dorsalis pedis pulses palpable bilaterally. Posterior tibial pulses palpable bilaterally. Capillary refill normal to all digits.  No cyanosis or clubbing noted. Pedal hair growth normal.  Neurologic Normal speech. Oriented to person, place, and time. Epicritic sensation to light touch grossly present bilaterally. Negative Tinel's sign availing sign.  No pain on palpation to the left lateral tarsal tunnel region.  No heel pain no Achilles tendinitis ATFL or posterior  tibial pain.  Dermatologic Nails well groomed and normal in appearance. No open wounds. No skin lesions.  Orthopedic:  No pain on palpation to anywhere on the left foot.  Manual muscle testing 5 out of 5.  Mild hammertoe contractures noted bilaterally 2 through 5   Radiographs: None Assessment:   1. Tarsal tunnel syndrome, left   2. Pain in left foot   3. Numbness in feet    Plan:  Patient was evaluated and treated and all questions answered.  Left foot tarsal tunnel syndrome -Resolved after undergoing injection and gabapentin therapy.  I instructed her that she can start gradually weaning herself off of gabapentin. -I also educated her on the  management of orthotics to address her pes planus deformity.  She currently has orthotics which are in good shape and form and I instructed her that if she needs a new pair she can come see me in the near future. -I have instructed her that she can continue taking gabapentin as needed and start weaning herself off of it. -MRI was reviewed with the patient and the findings were discussed.  At this point the findings of fibroma is not correlating with the pain that she is experiencing and therefore I discussed with the patient that this may be an incidental finding and we can hold off of any surgical intervention as the risk outweighs the benefit of undergoing a surgical procedure.  -I will continue seeing the patient for routine 56-month follow-up for diabetic foot care.  No follow-ups on file.

## 2018-12-13 NOTE — Telephone Encounter (Signed)
(  Her last bone density test was 01/2017; but I only have written results embedded in office notes, I don't have that study result. It also was documented that she was getting reclast from specialist as more of preventative in the last note?)  Can you check with office and see if they need a new DEXA since last was 01/2017? I typically don't repeat these before 2 years, but if they need a baseline I am fine with ordering this.   If they do not require a new one, we might need to send scanned media notes from specialist that was previously giving reclast.   Let Mishel know above please. I wasn't aware they needed study before seeing her.

## 2018-12-19 ENCOUNTER — Other Ambulatory Visit: Payer: Self-pay

## 2018-12-20 ENCOUNTER — Encounter: Payer: Self-pay | Admitting: Family Medicine

## 2018-12-20 ENCOUNTER — Ambulatory Visit (INDEPENDENT_AMBULATORY_CARE_PROVIDER_SITE_OTHER): Payer: Medicare Other | Admitting: Family Medicine

## 2018-12-20 VITALS — BP 108/78 | HR 89 | Temp 97.2°F | Ht 62.0 in | Wt 155.6 lb

## 2018-12-20 DIAGNOSIS — E1142 Type 2 diabetes mellitus with diabetic polyneuropathy: Secondary | ICD-10-CM

## 2018-12-20 DIAGNOSIS — Z0000 Encounter for general adult medical examination without abnormal findings: Secondary | ICD-10-CM | POA: Diagnosis not present

## 2018-12-20 DIAGNOSIS — M8580 Other specified disorders of bone density and structure, unspecified site: Secondary | ICD-10-CM

## 2018-12-20 DIAGNOSIS — E538 Deficiency of other specified B group vitamins: Secondary | ICD-10-CM | POA: Diagnosis not present

## 2018-12-20 NOTE — Patient Instructions (Signed)
Zoster Vaccine, Recombinant injection What is this medicine? ZOSTER VACCINE (ZOS ter vak SEEN) is used to prevent shingles in adults 66 years old and over. This vaccine is not used to treat shingles or nerve pain from shingles. This medicine may be used for other purposes; ask your health care provider or pharmacist if you have questions. COMMON BRAND NAME(S): SHINGRIX What should I tell my health care provider before I take this medicine? They need to know if you have any of these conditions:  blood disorders or disease  cancer like leukemia or lymphoma  immune system problems or therapy  an unusual or allergic reaction to vaccines, other medications, foods, dyes, or preservatives  pregnant or trying to get pregnant  breast-feeding How should I use this medicine? This vaccine is for injection in a muscle. It is given by a health care professional. Talk to your pediatrician regarding the use of this medicine in children. This medicine is not approved for use in children. Overdosage: If you think you have taken too much of this medicine contact a poison control center or emergency room at once. NOTE: This medicine is only for you. Do not share this medicine with others. What if I miss a dose? Keep appointments for follow-up (booster) doses as directed. It is important not to miss your dose. Call your doctor or health care professional if you are unable to keep an appointment. What may interact with this medicine?  medicines that suppress your immune system  medicines to treat cancer  steroid medicines like prednisone or cortisone This list may not describe all possible interactions. Give your health care provider a list of all the medicines, herbs, non-prescription drugs, or dietary supplements you use. Also tell them if you smoke, drink alcohol, or use illegal drugs. Some items may interact with your medicine. What should I watch for while using this medicine? Visit your doctor for  regular check ups. This vaccine, like all vaccines, may not fully protect everyone. What side effects may I notice from receiving this medicine? Side effects that you should report to your doctor or health care professional as soon as possible:  allergic reactions like skin rash, itching or hives, swelling of the face, lips, or tongue  breathing problems Side effects that usually do not require medical attention (report these to your doctor or health care professional if they continue or are bothersome):  chills  headache  fever  nausea, vomiting  redness, warmth, pain, swelling or itching at site where injected  tiredness This list may not describe all possible side effects. Call your doctor for medical advice about side effects. You may report side effects to FDA at 1-800-FDA-1088. Where should I keep my medicine? This vaccine is only given in a clinic, pharmacy, doctor's office, or other health care setting and will not be stored at home. NOTE: This sheet is a summary. It may not cover all possible information. If you have questions about this medicine, talk to your doctor, pharmacist, or health care provider.  2020 Elsevier/Gold Standard (2016-08-28 13:20:30)  

## 2018-12-20 NOTE — Progress Notes (Signed)
Renee Bauer DOB: [birth year] Encounter date: 12/20/2018  This is a 68 y.o. female who presents for complete physical   History of present illness/Additional concerns: Still not in house. This is frustration for her. Still a couple of things she is waiting for.   Mom is 48 in nursing home in [place] state. She just got dx with COVID. Very frustrating, can't even see her. Difficult to talk with her.   Son with PTSD in [place]. Grandson senior - can't get wrestling scholarship. 42 yr old grandson hard time here with virtual learning.   Husband prepping for double knee surgery which she is worried about.   Going to gym three times/week. Walking daily if not raining.   Not sleeping well. Goes to bed 10-11pm; falls asleep 1-2am. Will take xanax or zolpidem if hasn't slept well in a few nights.   Gets such bad dry mouth with anti-depressant. Last one was wellbutrin. Has done prozac, zoloft. All dry her out. Feels better now that she is off the stiglata (stopped this 10/1)  Has been taking the B12 without difficulty. Was deficient on last check.   Not checking blood sugars at all at home. Was told borderline by previous provider.   Had mammogram done 06/10/18 at [facility] care [facility] in [place]. Cysts, but benign.   Would like ears checked; tends to get wax build up. Uses drops 4 nights/week and flushes.    Past Medical History:  Diagnosis Date  . Anxiety   . Benign colon polyp 2018  . Depression   . Diabetes mellitus without complication ([place])   . GERD (gastroesophageal reflux disease)   . Hyperlipidemia   . Rosacea    Past Surgical History:  Procedure Laterality Date  . ABDOMINAL HYSTERECTOMY     menorrhagia  . BLADDER REPAIR    . BUNIONECTOMY Right   . COLON RESECTION  2007  . DILATION AND CURETTAGE OF UTERUS     Allergies  Allergen Reactions  . Clindamycin Hcl Rash   Current Meds  Medication Sig  . ALPRAZolam (XANAX) 1 MG tablet Take 1 mg by mouth as needed for anxiety.  Marland Kitchen CALCIUM  CITRATE PO Take 600 mg by mouth daily.  . Cholecalciferol (VITAMIN D3 PO) Take 2,000 Units by mouth daily.  Marland Kitchen esomeprazole (NEXIUM) 40 MG capsule Take 1 capsule (40 mg total) by mouth daily.  . fluticasone (FLONASE) 50 MCG/ACT nasal spray   . gabapentin (NEURONTIN) 100 MG capsule Take 1 capsule (100 mg total) by mouth 3 (three) times daily. (Patient taking differently: Take 100 mg by mouth 2 (two) times daily. )  . Glucosamine-Chondroitin (GLUCOSAMINE CHONDR COMPLEX PO) Take by mouth.  . lidocaine-prilocaine (EMLA) cream   . meloxicam (MOBIC) 7.5 MG tablet Take 1 tablet (7.5 mg total) by mouth 2 (two) times daily as needed for pain.  Marland Kitchen METRONIDAZOLE, TOPICAL, 0.75 % LOTN   . rosuvastatin (CRESTOR) 10 MG tablet Take 1 tablet (10 mg total) by mouth at bedtime.  . TURMERIC CURCUMIN PO Take 500 mg by mouth daily.  Marland Kitchen zolpidem (AMBIEN CR) 6.25 MG CR tablet Take 1 tablet (6.25 mg total) by mouth at bedtime as needed for sleep.   Social History   Tobacco Use  . Smoking status: Never Smoker  . Smokeless tobacco: Never Used  Substance Use Topics  . Alcohol use: Yes    Comment: occasional wine   Family History  Problem Relation Age of Onset  . Diabetes Mother   . Arrhythmia Mother   .  High blood pressure Mother   . Mesothelioma Father        asbestos exposure  . Healthy Sister   . Brain cancer Maternal Aunt   . Post-traumatic stress disorder Son   . Scoliosis Son      Review of Systems  Constitutional: Negative for activity change, appetite change, chills, fatigue, fever and unexpected weight change.  HENT: Negative for congestion, ear pain, hearing loss, sinus pressure, sinus pain, sore throat and trouble swallowing.   Eyes: Negative for pain and visual disturbance.  Respiratory: Negative for cough, chest tightness, shortness of breath and wheezing.   Cardiovascular: Negative for chest pain, palpitations and leg swelling.  Gastrointestinal: Negative for abdominal pain, blood in stool,  constipation, diarrhea, nausea and vomiting.  Genitourinary: Negative for difficulty urinating and menstrual problem.  Musculoskeletal: Negative for arthralgias and back pain.  Skin: Negative for rash.  Neurological: Negative for dizziness, weakness, numbness and headaches.  Hematological: Negative for adenopathy. Does not bruise/bleed easily.  Psychiatric/Behavioral: Negative for sleep disturbance and suicidal ideas. The patient is not nervous/anxious.     CBC:  Lab Results  Component Value Date   WBC 5.8 11/04/2018   HGB 14.5 11/04/2018   HCT 43.8 11/04/2018   MCHC 33.1 11/04/2018   RDW 13.2 11/04/2018   PLT 206.0 11/04/2018   CMP: Lab Results  Component Value Date   NA 140 11/04/2018   NA 141 05/14/2018   K 3.8 11/04/2018   CL 104 11/04/2018   CO2 30 11/04/2018   GLUCOSE 86 11/04/2018   BUN 12 11/04/2018   BUN 17 05/14/2018   CREATININE 0.84 11/04/2018   CALCIUM 9.5 11/04/2018   PROT 6.4 11/04/2018   BILITOT 0.6 11/04/2018   ALKPHOS 67 11/04/2018   ALT 9 11/04/2018   AST 12 11/04/2018   LIPID: Lab Results  Component Value Date   CHOL 139 11/04/2018   TRIG 68.0 11/04/2018   HDL 62.80 11/04/2018   LDLCALC 62 11/04/2018    Objective:  BP 108/78 (BP Location: Left Arm, Patient Position: Sitting, Cuff Size: Normal)   Pulse 89   Temp (!) 97.2 F (36.2 C) (Temporal)   Ht 5\' 2"  (1.575 m)   Wt 155 lb 9.6 oz (70.6 kg)   SpO2 98%   BMI 28.46 kg/m   Weight: 155 lb 9.6 oz (70.6 kg)   BP Readings from Last 3 Encounters:  12/20/18 108/78  11/14/18 110/69  11/07/18 134/86   Wt Readings from Last 3 Encounters:  12/20/18 155 lb 9.6 oz (70.6 kg)  11/14/18 155 lb (70.3 kg)  11/07/18 155 lb (70.3 kg)    Physical Exam Constitutional:      General: She is not in acute distress.    Appearance: She is well-developed.  HENT:     Head: Normocephalic and atraumatic.     Right Ear: External ear normal.     Left Ear: External ear normal.     Mouth/Throat:      Pharynx: No oropharyngeal exudate.  Eyes:     Conjunctiva/sclera: Conjunctivae normal.     Pupils: Pupils are equal, round, and reactive to light.  Neck:     Musculoskeletal: Normal range of motion and neck supple.     Thyroid: No thyromegaly.  Cardiovascular:     Rate and Rhythm: Normal rate and regular rhythm.     Heart sounds: Normal heart sounds. No murmur. No friction rub. No gallop.   Pulmonary:     Effort: Pulmonary effort is normal.  Breath sounds: Normal breath sounds.  Abdominal:     General: Bowel sounds are normal. There is no distension.     Palpations: Abdomen is soft. There is no mass.     Tenderness: There is no abdominal tenderness. There is no guarding.     Hernia: No hernia is present.  Musculoskeletal: Normal range of motion.        General: No tenderness or deformity.  Lymphadenopathy:     Cervical: No cervical adenopathy.  Skin:    General: Skin is warm and dry.     Findings: No rash.  Neurological:     Mental Status: She is alert and oriented to person, place, and time.     Deep Tendon Reflexes: Reflexes normal.     Reflex Scores:      Tricep reflexes are 2+ on the right side and 2+ on the left side.      Bicep reflexes are 2+ on the right side and 2+ on the left side.      Brachioradialis reflexes are 2+ on the right side and 2+ on the left side.      Patellar reflexes are 2+ on the right side and 2+ on the left side. Psychiatric:        Speech: Speech normal.        Behavior: Behavior normal.        Thought Content: Thought content normal.     Assessment/Plan: Health Maintenance Due  Topic Date Due  . FOOT EXAM  09/08/1962  . DEXA SCAN  09/07/2017   Health Maintenance reviewed.  1. Preventative health care After repeat review of records, I do not see where she had a shingles injection in the past, but she did have one documented in her own notes from 2018.  We will have her sign a release to see if we can get more records from her primary  care including previous mammogram results.  2. Controlled type 2 diabetes mellitus with diabetic polyneuropathy, without long-term current use of insulin (HCC) Previous A1c's have actually not been in the diabetic range, but she was previously on medication.  Will recheck A1c now that she is not medication in January and see how her numbers look. - Hemoglobin A1c; Future  3. B12 deficiency - Vitamin B12; Future - Folate; Future  4. Osteopenia, unspecified location Previous Reclast treatment; she will sign release to see if a full bone density report since this can be scanned into the system.  After review, it seems that rheumatology will see her for follow-up on this.  Return for bloodwork in January.  Micheline Rough, MD

## 2019-02-04 ENCOUNTER — Ambulatory Visit: Payer: Medicare Other | Admitting: Rheumatology

## 2019-02-11 ENCOUNTER — Other Ambulatory Visit: Payer: Self-pay | Admitting: *Deleted

## 2019-02-11 DIAGNOSIS — M8580 Other specified disorders of bone density and structure, unspecified site: Secondary | ICD-10-CM

## 2019-02-26 ENCOUNTER — Encounter: Payer: Self-pay | Admitting: Family Medicine

## 2019-02-26 LAB — HM DIABETES EYE EXAM

## 2019-02-27 ENCOUNTER — Encounter: Payer: Self-pay | Admitting: Family Medicine

## 2019-03-04 ENCOUNTER — Ambulatory Visit: Payer: Medicare Other | Admitting: Rheumatology

## 2019-03-11 ENCOUNTER — Telehealth: Payer: Self-pay | Admitting: Orthopaedic Surgery

## 2019-03-11 NOTE — Telephone Encounter (Signed)
Called patient left message on voicemail to return call to schedule an appointment with Dr Durward Fortes for cortisone injection in left knee

## 2019-03-12 ENCOUNTER — Ambulatory Visit: Payer: Medicare Other | Admitting: Podiatry

## 2019-03-17 ENCOUNTER — Other Ambulatory Visit: Payer: Self-pay

## 2019-03-17 ENCOUNTER — Ambulatory Visit (INDEPENDENT_AMBULATORY_CARE_PROVIDER_SITE_OTHER): Payer: Medicare Other | Admitting: Podiatry

## 2019-03-17 DIAGNOSIS — G5752 Tarsal tunnel syndrome, left lower limb: Secondary | ICD-10-CM

## 2019-03-17 DIAGNOSIS — M79675 Pain in left toe(s): Secondary | ICD-10-CM

## 2019-03-17 DIAGNOSIS — B351 Tinea unguium: Secondary | ICD-10-CM | POA: Diagnosis not present

## 2019-03-17 DIAGNOSIS — E1142 Type 2 diabetes mellitus with diabetic polyneuropathy: Secondary | ICD-10-CM

## 2019-03-17 DIAGNOSIS — M79674 Pain in right toe(s): Secondary | ICD-10-CM | POA: Diagnosis not present

## 2019-03-17 MED ORDER — GABAPENTIN 100 MG PO CAPS: 100.0000 mg | ORAL_CAPSULE | Freq: Three times a day (TID) | ORAL | 3 refills | Status: DC

## 2019-03-18 ENCOUNTER — Encounter: Payer: Self-pay | Admitting: Orthopaedic Surgery

## 2019-03-18 ENCOUNTER — Ambulatory Visit (INDEPENDENT_AMBULATORY_CARE_PROVIDER_SITE_OTHER): Payer: Medicare Other | Admitting: Orthopaedic Surgery

## 2019-03-18 ENCOUNTER — Encounter: Payer: Self-pay | Admitting: Podiatry

## 2019-03-18 ENCOUNTER — Encounter: Payer: Self-pay | Admitting: Family Medicine

## 2019-03-18 VITALS — Ht 62.0 in | Wt 155.0 lb

## 2019-03-18 DIAGNOSIS — M1712 Unilateral primary osteoarthritis, left knee: Secondary | ICD-10-CM | POA: Diagnosis not present

## 2019-03-18 LAB — HEPATIC FUNCTION PANEL
AG Ratio: 1.7 (calc) (ref 1.0–2.5)
ALT: 12 U/L (ref 6–29)
AST: 14 U/L (ref 10–35)
Albumin: 4.3 g/dL (ref 3.6–5.1)
Alkaline phosphatase (APISO): 75 U/L (ref 37–153)
Bilirubin, Direct: 0.1 mg/dL (ref 0.0–0.2)
Globulin: 2.5 g/dL (calc) (ref 1.9–3.7)
Indirect Bilirubin: 0.3 mg/dL (calc) (ref 0.2–1.2)
Total Bilirubin: 0.4 mg/dL (ref 0.2–1.2)
Total Protein: 6.8 g/dL (ref 6.1–8.1)

## 2019-03-18 MED ORDER — METHYLPREDNISOLONE ACETATE 40 MG/ML IJ SUSP
80.0000 mg | INTRAMUSCULAR | Status: AC | PRN
  Administered 2019-03-18: 13:00:00 80 mg via INTRA_ARTICULAR

## 2019-03-18 MED ORDER — TERBINAFINE HCL 250 MG PO TABS: 250.0000 mg | ORAL_TABLET | Freq: Every day | ORAL | 0 refills | Status: DC

## 2019-03-18 MED ORDER — LIDOCAINE HCL 1 % IJ SOLN
2.0000 mL | INTRAMUSCULAR | Status: AC | PRN
  Administered 2019-03-18: 2 mL

## 2019-03-18 MED ORDER — BUPIVACAINE HCL 0.5 % IJ SOLN
2.0000 mL | INTRAMUSCULAR | Status: AC | PRN
  Administered 2019-03-18: 2 mL via INTRA_ARTICULAR

## 2019-03-18 NOTE — Progress Notes (Signed)
Office Visit Note   Patient: Renee Bauer           Date of Birth: [birth year]           MRN: [id number] Visit Date: 03/18/2019              Requested by: Caren Macadam, MD [place],  [place] [id number] PCP: Caren Macadam, MD   Assessment & Plan: Visit Diagnoses:  1. Unilateral primary osteoarthritis, left knee     Plan: Finished a course of Synvisc in October and having recurrent symptoms of osteoarthritis in the left knee predominately medially.  Has to wait several months to repeat the Synvisc which she like to do.  We will need to wait before we precertified.  Will inject her knee with cortisone today.  She is aware that it may elevate her blood sugars  Follow-Up Instructions: Return if symptoms worsen or fail to improve.   Orders:  Orders Placed This Encounter  Procedures  . Large Joint Inj: L knee   No orders of the defined types were placed in this encounter.     Procedures: Large Joint Inj: L knee on 03/18/2019 1:08 PM Indications: pain and diagnostic evaluation Details: 25 G 1.5 in needle, anteromedial approach  Arthrogram: No  Medications: 2 mL lidocaine 1 %; 2 mL bupivacaine 0.5 %; 80 mg methylPREDNISolone acetate 40 MG/ML Procedure, treatment alternatives, risks and benefits explained, specific risks discussed. Consent was given by the patient. Patient was prepped and draped in the usual sterile fashion.       Clinical Data: No additional findings.   Subjective: Chief Complaint  Patient presents with  . Left Knee - Pain  Patient presents today for recurrent left knee pain. She was last here on 11/14/2018 and was finishing up her series of Synvisc on her left knee. Patient states that knee just started hurting again in the last month. She takes Meloxicam for her arthritis. She is wanting to get a cortisone injection today.   HPI  Review of Systems   Objective: Vital Signs: Ht 5\' 2"  (1.575 m)   Wt 155 lb (70.3 kg)    BMI 28.35 kg/m   Physical Exam Constitutional:      Appearance: She is well-developed.  Eyes:     Pupils: Pupils are equal, round, and reactive to light.  Pulmonary:     Effort: Pulmonary effort is normal.  Skin:    General: Skin is warm and dry.  Neurological:     Mental Status: She is alert and oriented to person, place, and time.  Psychiatric:        Behavior: Behavior normal.     Ortho Exam awake alert and oriented x3.  Comfortable sitting.  Left knee was not hot red warm or swollen.  No effusion.  Does have some mild medial joint pain.  Mild patellar crepitation.  No instability.  No calf pain.  No popliteal mass or pain.  Straight leg raise negative.  No pain with range of motion of either hip  Specialty Comments:  No specialty comments available.  Imaging: No results found.   PMFS History: Patient Active Problem List   Diagnosis Date Noted  . Unilateral primary osteoarthritis, left knee 10/31/2018  . Arthritis 10/30/2018  . DM (diabetes mellitus) type II controlled, neurological manifestation ([facility] [place]) 10/30/2018  . Neuropathy of both feet 10/30/2018  . Anxiety 10/30/2018  . Insomnia 10/30/2018  . History of skin cancer 10/30/2018  . Osteopenia 09/30/2018  Past Medical History:  Diagnosis Date  . Anxiety   . Benign colon polyp 2018  . Depression   . Diabetes mellitus without complication ([place])   . GERD (gastroesophageal reflux disease)   . Hyperlipidemia   . Rosacea     Family History  Problem Relation Age of Onset  . Diabetes Mother   . Arrhythmia Mother   . High blood pressure Mother   . Mesothelioma Father        asbestos exposure  . Healthy Sister   . Brain cancer Maternal Aunt   . Post-traumatic stress disorder Son   . Scoliosis Son     Past Surgical History:  Procedure Laterality Date  . ABDOMINAL HYSTERECTOMY     menorrhagia  . BLADDER REPAIR    . BUNIONECTOMY Right   . COLON RESECTION  2007  . DILATION AND CURETTAGE OF UTERUS      Social History   Occupational History  . Not on file  Tobacco Use  . Smoking status: Never Smoker  . Smokeless tobacco: Never Used  Substance and Sexual Activity  . Alcohol use: Yes    Comment: occasional wine  . Drug use: Never  . Sexual activity: Not on file

## 2019-03-18 NOTE — Progress Notes (Signed)
Subjective:  Patient ID: Renee Bauer, female    DOB: 30-May-1950,  MRN: [id number]  Chief Complaint  Patient presents with  . Nail Problem    pt is here for routine foot care, pt also states that she is looking to get back on gabapentin, due to her feeling numbness and tingling, especially at night time.    69 y.o. female presents with the above complaint.  Patient presents with a follow-up of evaluation and management of type 2 diabetes with thickened elongated discolored toenails.  Patient is looking to get gabapentin as well as that has been helping manage her diabetic neuropathy related pains worked very well.  She states that she has numbness and tingling in both feet especially at nighttime he tends to get worse.  She denies any other acute complaints.  She would also like to treat onychomycosis of his digits with medications and would like to discuss further about that.  She denies any other acute complaints   Review of Systems: Negative except as noted in the HPI. Denies N/V/F/Ch.  Past Medical History:  Diagnosis Date  . Anxiety   . Benign colon polyp 2018  . Depression   . Diabetes mellitus without complication ([place])   . GERD (gastroesophageal reflux disease)   . Hyperlipidemia   . Rosacea     Current Outpatient Medications:  .  ALPRAZolam (XANAX) 1 MG tablet, Take 1 mg by mouth as needed for anxiety., Disp: , Rfl:  .  CALCIUM CITRATE PO, Take 600 mg by mouth daily., Disp: , Rfl:  .  Cholecalciferol (VITAMIN D3 PO), Take 2,000 Units by mouth daily., Disp: , Rfl:  .  esomeprazole (NEXIUM) 40 MG capsule, Take 1 capsule (40 mg total) by mouth daily., Disp: 90 capsule, Rfl: 3 .  fluticasone (FLONASE) 50 MCG/ACT nasal spray, , Disp: , Rfl:  .  gabapentin (NEURONTIN) 100 MG capsule, Take 1 capsule (100 mg total) by mouth 3 (three) times daily. (Patient taking differently: Take 100 mg by mouth 2 (two) times daily. ), Disp: 90 capsule, Rfl: 11 .  Glucosamine-Chondroitin  (GLUCOSAMINE CHONDR COMPLEX PO), Take by mouth., Disp: , Rfl:  .  lidocaine-prilocaine (EMLA) cream, , Disp: , Rfl:  .  meloxicam (MOBIC) 7.5 MG tablet, Take 1 tablet (7.5 mg total) by mouth 2 (two) times daily as needed for pain., Disp: 180 tablet, Rfl: 1 .  METRONIDAZOLE, TOPICAL, 0.75 % LOTN, , Disp: , Rfl:  .  rosuvastatin (CRESTOR) 10 MG tablet, Take 1 tablet (10 mg total) by mouth at bedtime., Disp: 90 tablet, Rfl: 1 .  TURMERIC CURCUMIN PO, Take 500 mg by mouth daily., Disp: , Rfl:  .  zolpidem (AMBIEN CR) 6.25 MG CR tablet, Take 1 tablet (6.25 mg total) by mouth at bedtime as needed for sleep., Disp: 90 tablet, Rfl: 1 .  gabapentin (NEURONTIN) 100 MG capsule, Take 1 capsule (100 mg total) by mouth 3 (three) times daily., Disp: 90 capsule, Rfl: 3 .  terbinafine (LAMISIL) 250 MG tablet, Take 1 tablet (250 mg total) by mouth daily., Disp: 90 tablet, Rfl: 0  Social History   Tobacco Use  Smoking Status Never Smoker  Smokeless Tobacco Never Used    Allergies  Allergen Reactions  . Clindamycin Hcl Rash   Objective:  There were no vitals filed for this visit. There is no height or weight on file to calculate BMI. Constitutional Well developed. Well nourished.  Vascular Dorsalis pedis pulses palpable bilaterally. Posterior tibial pulses palpable bilaterally. Capillary refill  normal to all digits.  No cyanosis or clubbing noted. Pedal hair growth normal.  Neurologic Normal speech. Oriented to person, place, and time. Epicritic sensation to light touch grossly present bilaterally. Negative Tinel's sign availing sign.  No pain on palpation to the left lateral tarsal tunnel region.  No heel pain no Achilles tendinitis ATFL or posterior tibial pain. Nail Exam: Pt has thick disfigured discolored nails with subungual debris noted bilateral entire nail hallux through fifth toenails  Dermatologic Nails well groomed and normal in appearance. No open wounds. No skin lesions.  Orthopedic:   No pain on palpation to anywhere on the left foot.  Manual muscle testing 5 out of 5.  Mild hammertoe contractures noted bilaterally 2 through 5   Radiographs: None Assessment:   1. Tarsal tunnel syndrome, left   2. Pain due to onychomycosis of toenails of both feet   3. Onychomycosis   4. Controlled type 2 diabetes mellitus with diabetic polyneuropathy, without long-term current use of insulin ([place])    Plan:  Patient was evaluated and treated and all questions answered.  Left foot tarsal tunnel syndrome -Resolved after undergoing gabapentin management and injection.  Onychomycosis with pain with left second digit being worse -Nails palliatively debrided as below. -Educated on self-care -Educated the patient on the etiology of onychomycosis and various treatment options associated with improving the fungal load.  I explained to the patient that there is 3 treatment options available to treat the onychomycosis including topical, p.o., laser treatment.  Patient elected to undergo p.o. options with Lamisil/terbinafine therapy.  In order for me to start the medication therapy, I explained to the patient the importance of evaluating the liver and obtaining the liver function test.  Once the liver function test comes back normal I will start him on 12-month course of Lamisil therapy.  Patient understood all risk and would like to proceed with Lamisil therapy.  I have asked the patient to immediately stop the Lamisil therapy if she has any reactions to it and call the office or go to the emergency room right away.  Patient states understanding   Procedure: Nail Debridement Rationale: pain  Type of Debridement: manual, sharp debridement. Instrumentation: Nail nipper, rotary burr. Number of Nails: 10  Procedures and Treatment: Consent by patient was obtained for treatment procedures. The patient understood the discussion of treatment and procedures well. All questions were answered thoroughly  reviewed. Debridement of mycotic and hypertrophic toenails, 1 through 5 bilateral and clearing of subungual debris. No ulceration, no infection noted.  Return Visit-Office Procedure: Patient instructed to return to the office for a follow up visit 3 months for continued evaluation and treatment.  Boneta Lucks, DPM    No follow-ups on file.

## 2019-03-19 NOTE — Telephone Encounter (Signed)
Labs were already ordered by me so she can set up lab visit to have these rechecked. I will call/set up followup after I see results.   I do recommend the COVID vaccine.

## 2019-03-21 ENCOUNTER — Other Ambulatory Visit: Payer: Self-pay

## 2019-03-24 ENCOUNTER — Other Ambulatory Visit: Payer: Self-pay

## 2019-03-24 ENCOUNTER — Other Ambulatory Visit (INDEPENDENT_AMBULATORY_CARE_PROVIDER_SITE_OTHER): Payer: Medicare Other

## 2019-03-24 DIAGNOSIS — E538 Deficiency of other specified B group vitamins: Secondary | ICD-10-CM | POA: Diagnosis not present

## 2019-03-24 DIAGNOSIS — E1142 Type 2 diabetes mellitus with diabetic polyneuropathy: Secondary | ICD-10-CM | POA: Diagnosis not present

## 2019-03-24 LAB — FOLATE: Folate: 14 ng/mL (ref >5.9)

## 2019-03-24 LAB — HEMOGLOBIN A1C: Hgb A1c MFr Bld: 6 % (ref 4.6–6.5)

## 2019-03-24 LAB — VITAMIN B12: Vitamin B-12: 298 pg/mL (ref 211–911)

## 2019-03-27 NOTE — Progress Notes (Signed)
Office Visit Note  Patient: Renee Bauer             Date of Birth: Jul 16, 1950           MRN: [id number]             PCP: Caren Macadam, MD Referring: Caren Macadam, MD Visit Date: 04/01/2019 Occupation: @GUAROCC @  Subjective:  Pain in multiple joints..   History of Present Illness: Alexandera Bauer is a 69 y.o. female seen in consultation per request of her PCP.  According to patient she moved here last year from [place] state.  She states about 10 years ago she developed dry mouth symptoms for which she was referred to a rheumatologist while she was living in [place].  She was told that the dry mouth was related to her antidepressants and the symptoms improved after the medications were discontinued.  She also has been having some joint pain for the last few years.  And has been seeing rheumatologist on yearly basis.  She states she has arthritis in her left hand and carpal tunnel syndrome symptoms.  She has been dealing with bilateral trochanteric bursitis and both knees have been painful.  She has had several cortisone injections in her left knee joint almost every 3 months and the last injection was last week.  She also has discomfort in her bilateral ankles and feet.  She had right bunionectomy and continues to have discomfort in her right foot.  She denies any joint swelling.  She states she has been getting bone densities there as well.  She recalls having 1 Reclast infusion probably 2 years ago.  Activities of Daily Living:  Patient reports morning stiffness for 15 minutes.   Patient Reports nocturnal pain.  Difficulty dressing/grooming: Denies Difficulty climbing stairs: Denies Difficulty getting out of chair: Denies Difficulty using hands for taps, buttons, cutlery, and/or writing: Reports  Review of Systems  Constitutional: Negative for fatigue, night sweats, weight gain and weight loss.  HENT: Negative for mouth sores, trouble swallowing, trouble swallowing, mouth  dryness and nose dryness.   Eyes: Negative for pain, redness, visual disturbance and dryness.  Respiratory: Negative for cough, shortness of breath and difficulty breathing.   Cardiovascular: Negative for chest pain, palpitations, hypertension, irregular heartbeat and swelling in legs/feet.  Gastrointestinal: Negative for blood in stool, constipation and diarrhea.  Endocrine: Positive for heat intolerance. Negative for increased urination.  Genitourinary: Negative for difficulty urinating and vaginal dryness.  Musculoskeletal: Positive for arthralgias, joint pain and morning stiffness. Negative for joint swelling, myalgias, muscle weakness, muscle tenderness and myalgias.  Skin: Negative for color change, rash, hair loss, redness, skin tightness, ulcers and sensitivity to sunlight.  Allergic/Immunologic: Negative for susceptible to infections.  Neurological: Negative for dizziness, numbness, memory loss, night sweats and weakness.  Hematological: Negative for bruising/bleeding tendency and swollen glands.  Psychiatric/Behavioral: Positive for depressed mood and sleep disturbance. The patient is nervous/anxious.     PMFS History:  Patient Active Problem List   Diagnosis Date Noted  . Unilateral primary osteoarthritis, left knee 10/31/2018  . Arthritis 10/30/2018  . DM (diabetes mellitus) type II controlled, neurological manifestation ([place]) 10/30/2018  . Neuropathy of both feet 10/30/2018  . Anxiety 10/30/2018  . Insomnia 10/30/2018  . History of skin cancer 10/30/2018  . Osteopenia 09/30/2018    Past Medical History:  Diagnosis Date  . Anxiety   . Arthritis   . Benign colon polyp 2018  . Bursitis   . Depression   .  Diabetes mellitus without complication ([place])   . GERD (gastroesophageal reflux disease)   . Hyperlipidemia   . Rosacea     Family History  Problem Relation Age of Onset  . Diabetes Mother   . Arrhythmia Mother   . High blood pressure Mother   . Mesothelioma  Father        asbestos exposure  . Healthy Sister   . Brain cancer Maternal Aunt   . Post-traumatic stress disorder Son   . Scoliosis Son    Past Surgical History:  Procedure Laterality Date  . ABDOMINAL HYSTERECTOMY     menorrhagia  . BLADDER REPAIR    . BUNIONECTOMY Right   . COLON RESECTION  2007  . DILATION AND CURETTAGE OF UTERUS     Social History   Social History Narrative  . Not on file   Immunization History  Administered Date(s) Administered  . Fluad Quad(high Dose 65+) 11/04/2018  . Pneumococcal Conjugate-13 11/04/2018  . Td 07/19/2016     Objective: Vital Signs: BP 127/81 (BP Location: Right Arm, Patient Position: Sitting, Cuff Size: Normal)   Pulse 71   Resp 16   Ht 5' 1.5" (1.562 m)   Wt 158 lb 3.2 oz (71.8 kg)   BMI 29.41 kg/m    Physical Exam Vitals and nursing note reviewed.  Constitutional:      Appearance: She is well-developed.  HENT:     Head: Normocephalic and atraumatic.  Eyes:     Conjunctiva/sclera: Conjunctivae normal.  Cardiovascular:     Rate and Rhythm: Normal rate and regular rhythm.     Heart sounds: Normal heart sounds.  Pulmonary:     Effort: Pulmonary effort is normal.     Breath sounds: Normal breath sounds.  Abdominal:     General: Bowel sounds are normal.     Palpations: Abdomen is soft.  Musculoskeletal:     Cervical back: Normal range of motion.  Lymphadenopathy:     Cervical: No cervical adenopathy.  Skin:    General: Skin is warm and dry.     Capillary Refill: Capillary refill takes less than 2 seconds.  Neurological:     Mental Status: She is alert and oriented to person, place, and time.  Psychiatric:        Behavior: Behavior normal.      Musculoskeletal Exam: C-spine, thoracic and lumbar spine were in good range of motion. She had no SI joint tenderness. Shoulder joints, elbow joints, wrist joints with good range of motion. She has bilateral CMC PIP and DIP thickening but no synovitis. She had tenderness  over bilateral trochanteric bursa. She has discomfort with range of motion of bilateral knee joints without any warmth swelling or effusion. She had post bunionectomy change in her right foot. She has PIP and DIP thickening in her feet consistent with osteoarthritis. No synovitis was noted.  CDAI Exam: CDAI Score: -- Patient Global: --; Provider Global: -- Swollen: --; Tender: -- Joint Exam 04/01/2019   No joint exam has been documented for this visit   There is currently no information documented on the homunculus. Go to the Rheumatology activity and complete the homunculus joint exam.  Investigation: No additional findings.  Imaging: No results found.  Recent Labs: Lab Results  Component Value Date   WBC 5.8 11/04/2018   HGB 14.5 11/04/2018   PLT 206.0 11/04/2018   NA 140 11/04/2018   K 3.8 11/04/2018   CL 104 11/04/2018   CO2 30 11/04/2018   GLUCOSE  86 11/04/2018   BUN 12 11/04/2018   CREATININE 0.84 11/04/2018   BILITOT 0.4 03/17/2019   ALKPHOS 67 11/04/2018   AST 14 03/17/2019   ALT 12 03/17/2019   PROT 6.8 03/17/2019   ALBUMIN 4.2 11/04/2018   CALCIUM 9.5 11/04/2018    Speciality Comments: No specialty comments available.  Procedures:  No procedures performed Allergies: Clindamycin hcl   Assessment / Plan:     Visit Diagnoses: Pain in both hands -patient has been complaining about pain in bilateral hands.  No synovitis was noted.  DIP and PIP thickening was noted.  Plan: XR Hand 2 View Right, XR Hand 2 View Left.  X-rays were consistent with osteoarthritis involving bilateral hands and CMC joints.  Trochanteric bursitis of both hips-she has been experiencing increased trochanteric bursa discomfort.  She has had recurrent trochanteric bursitis for many years.  A handout on IT band exercises was given.  Chronic pain of both knees -planes of pain and discomfort in her bilateral knee joints for few years.  She has had cortisone injections in her left knee joint  every 3 months.  Plan: XR KNEE 3 VIEW RIGHT, XR KNEE 3 VIEW LEFT.  The x-ray showed right knee joint moderate osteoarthritis and moderate chondromalacia patella in the left knee joint severe osteoarthritis and moderate chondromalacia patella.  Patient states that she has been seeing Dr. Durward Fortes now.  He has been doing cortisone injections and Visco supplement injections.  Pain in both feet -she complains of pain and discomfort in her bilateral feet.  Plan: XR Foot 2 Views Right, XR Foot 2 Views Left.  The right foot showed post bunionectomy changes.  Bilateral feet show osteoarthritic changes.  Neuropathy of both feet-she complains of neuropathy in her feet for many years.  Osteopenia, unspecified location-patient states that she had Reclast infusion in [place] either 1 or 2 years ago.  She had recent bone density ordered by her PCP.  I have advised her to bring the results at next visit.  Can plan to schedule Reclast infusions based on the bone density results.  Controlled type 2 diabetes mellitus with diabetic polyneuropathy, without long-term current use of insulin ([facility])  History of skin cancer  Anxiety and depression  Other insomnia  History of hyperlipidemia  History of gastroesophageal reflux (GERD)  Hx of colonic polyps  Orders: Orders Placed This Encounter  Procedures  . XR Hand 2 View Right  . XR Hand 2 View Left  . XR KNEE 3 VIEW RIGHT  . XR KNEE 3 VIEW LEFT  . XR Foot 2 Views Right  . XR Foot 2 Views Left   No orders of the defined types were placed in this encounter.   Face-to-face time spent with patient was 50 minutes. Greater than 50% of time was spent in counseling and coordination of care.  Follow-Up Instructions: Return for Osteoarthritis.   Bo Merino, MD  Note - This record has been created using Editor, commissioning.  Chart creation errors have been sought, but may not always  have been located. Such creation errors do not reflect on  the standard  of medical care.

## 2019-04-01 ENCOUNTER — Ambulatory Visit: Payer: Self-pay

## 2019-04-01 ENCOUNTER — Ambulatory Visit (INDEPENDENT_AMBULATORY_CARE_PROVIDER_SITE_OTHER): Payer: Medicare Other | Admitting: Rheumatology

## 2019-04-01 ENCOUNTER — Other Ambulatory Visit: Payer: Self-pay

## 2019-04-01 ENCOUNTER — Encounter: Payer: Self-pay | Admitting: Rheumatology

## 2019-04-01 VITALS — BP 127/81 | HR 71 | Resp 16 | Ht 61.5 in | Wt 158.2 lb

## 2019-04-01 DIAGNOSIS — M79641 Pain in right hand: Secondary | ICD-10-CM | POA: Diagnosis not present

## 2019-04-01 DIAGNOSIS — M79672 Pain in left foot: Secondary | ICD-10-CM

## 2019-04-01 DIAGNOSIS — Z860100 Personal history of colon polyps, unspecified: Secondary | ICD-10-CM

## 2019-04-01 DIAGNOSIS — F329 Major depressive disorder, single episode, unspecified: Secondary | ICD-10-CM

## 2019-04-01 DIAGNOSIS — G8929 Other chronic pain: Secondary | ICD-10-CM

## 2019-04-01 DIAGNOSIS — M79642 Pain in left hand: Secondary | ICD-10-CM | POA: Diagnosis not present

## 2019-04-01 DIAGNOSIS — M7062 Trochanteric bursitis, left hip: Secondary | ICD-10-CM

## 2019-04-01 DIAGNOSIS — Z85828 Personal history of other malignant neoplasm of skin: Secondary | ICD-10-CM

## 2019-04-01 DIAGNOSIS — Z8719 Personal history of other diseases of the digestive system: Secondary | ICD-10-CM

## 2019-04-01 DIAGNOSIS — F32A Depression, unspecified: Secondary | ICD-10-CM

## 2019-04-01 DIAGNOSIS — M79671 Pain in right foot: Secondary | ICD-10-CM

## 2019-04-01 DIAGNOSIS — M25561 Pain in right knee: Secondary | ICD-10-CM | POA: Diagnosis not present

## 2019-04-01 DIAGNOSIS — Z8639 Personal history of other endocrine, nutritional and metabolic disease: Secondary | ICD-10-CM

## 2019-04-01 DIAGNOSIS — M7061 Trochanteric bursitis, right hip: Secondary | ICD-10-CM | POA: Diagnosis not present

## 2019-04-01 DIAGNOSIS — M25562 Pain in left knee: Secondary | ICD-10-CM

## 2019-04-01 DIAGNOSIS — G5793 Unspecified mononeuropathy of bilateral lower limbs: Secondary | ICD-10-CM

## 2019-04-01 DIAGNOSIS — G4709 Other insomnia: Secondary | ICD-10-CM

## 2019-04-01 DIAGNOSIS — E1142 Type 2 diabetes mellitus with diabetic polyneuropathy: Secondary | ICD-10-CM

## 2019-04-01 DIAGNOSIS — F419 Anxiety disorder, unspecified: Secondary | ICD-10-CM

## 2019-04-01 DIAGNOSIS — M8580 Other specified disorders of bone density and structure, unspecified site: Secondary | ICD-10-CM

## 2019-04-01 NOTE — Patient Instructions (Signed)
Journal for Nurse Practitioners, 15(4), 263-267. Retrieved November 05, 2017 from [email or web address].[email or web address]">  Knee Exercises Ask your health care provider which exercises are safe for you. Do exercises exactly as told by your health care provider and adjust them as directed. It is normal to feel mild stretching, pulling, tightness, or discomfort as you do these exercises. Stop right away if you feel sudden pain or your pain gets worse. Do not begin these exercises until told by your health care provider. Stretching and range-of-motion exercises These exercises warm up your muscles and joints and improve the movement and flexibility of your knee. These exercises also help to relieve pain and swelling. Knee extension, prone 1. Lie on your abdomen (prone position) on a bed. 2. Place your left / right knee just beyond the edge of the surface so your knee is not on the bed. You can put a towel under your left / right thigh just above your kneecap for comfort. 3. Relax your leg muscles and allow gravity to straighten your knee (extension). You should feel a stretch behind your left / right knee. 4. Hold this position for __________ seconds. 5. Scoot up so your knee is supported between repetitions. Repeat __________ times. Complete this exercise __________ times a day. Knee flexion, active  1. Lie on your back with both legs straight. If this causes back discomfort, bend your left / right knee so your foot is flat on the floor. 2. Slowly slide your left / right heel back toward your buttocks. Stop when you feel a gentle stretch in the front of your knee or thigh (flexion). 3. Hold this position for __________ seconds. 4. Slowly slide your left / right heel back to the starting position. Repeat __________ times. Complete this exercise __________ times a day. Quadriceps stretch, prone  1. Lie on your abdomen on a firm surface, such as a bed or padded floor. 2. Bend your left / right knee and hold  your ankle. If you cannot reach your ankle or pant leg, loop a belt around your foot and grab the belt instead. 3. Gently pull your heel toward your buttocks. Your knee should not slide out to the side. You should feel a stretch in the front of your thigh and knee (quadriceps). 4. Hold this position for __________ seconds. Repeat __________ times. Complete this exercise __________ times a day. Hamstring, supine 1. Lie on your back (supine position). 2. Loop a belt or towel over the ball of your left / right foot. The ball of your foot is on the walking surface, right under your toes. 3. Straighten your left / right knee and slowly pull on the belt to raise your leg until you feel a gentle stretch behind your knee (hamstring). ? Do not let your knee bend while you do this. ? Keep your other leg flat on the floor. 4. Hold this position for __________ seconds. Repeat __________ times. Complete this exercise __________ times a day. Strengthening exercises These exercises build strength and endurance in your knee. Endurance is the ability to use your muscles for a long time, even after they get tired. Quadriceps, isometric This exercise stretches the muscles in front of your thigh (quadriceps) without moving your knee joint (isometric). 1. Lie on your back with your left / right leg extended and your other knee bent. Put a rolled towel or small pillow under your knee if told by your health care provider. 2. Slowly tense the muscles in the front of your left /   right thigh. You should see your kneecap slide up toward your hip or see increased dimpling just above the knee. This motion will push the back of the knee toward the floor. 3. For __________ seconds, hold the muscle as tight as you can without increasing your pain. 4. Relax the muscles slowly and completely. Repeat __________ times. Complete this exercise __________ times a day. Straight leg raises This exercise stretches the muscles in front  of your thigh (quadriceps) and the muscles that move your hips (hip flexors). 1. Lie on your back with your left / right leg extended and your other knee bent. 2. Tense the muscles in the front of your left / right thigh. You should see your kneecap slide up or see increased dimpling just above the knee. Your thigh may even shake a bit. 3. Keep these muscles tight as you raise your leg 4-6 inches (10-15 cm) off the floor. Do not let your knee bend. 4. Hold this position for __________ seconds. 5. Keep these muscles tense as you lower your leg. 6. Relax your muscles slowly and completely after each repetition. Repeat __________ times. Complete this exercise __________ times a day. Hamstring, isometric 1. Lie on your back on a firm surface. 2. Bend your left / right knee about __________ degrees. 3. Dig your left / right heel into the surface as if you are trying to pull it toward your buttocks. Tighten the muscles in the back of your thighs (hamstring) to "dig" as hard as you can without increasing any pain. 4. Hold this position for __________ seconds. 5. Release the tension gradually and allow your muscles to relax completely for __________ seconds after each repetition. Repeat __________ times. Complete this exercise __________ times a day. Hamstring curls If told by your health care provider, do this exercise while wearing ankle weights. Begin with __________ lb weights. Then increase the weight by 1 lb (0.5 kg) increments. Do not wear ankle weights that are more than __________ lb. 1. Lie on your abdomen with your legs straight. 2. Bend your left / right knee as far as you can without feeling pain. Keep your hips flat against the floor. 3. Hold this position for __________ seconds. 4. Slowly lower your leg to the starting position. Repeat __________ times. Complete this exercise __________ times a day. Squats This exercise strengthens the muscles in front of your thigh and knee  (quadriceps). 1. Stand in front of a table, with your feet and knees pointing straight ahead. You may rest your hands on the table for balance but not for support. 2. Slowly bend your knees and lower your hips like you are going to sit in a chair. ? Keep your weight over your heels, not over your toes. ? Keep your lower legs upright so they are parallel with the table legs. ? Do not let your hips go lower than your knees. ? Do not bend lower than told by your health care provider. ? If your knee pain increases, do not bend as low. 3. Hold the squat position for __________ seconds. 4. Slowly push with your legs to return to standing. Do not use your hands to pull yourself to standing. Repeat __________ times. Complete this exercise __________ times a day. Wall slides This exercise strengthens the muscles in front of your thigh and knee (quadriceps). 1. Lean your back against a smooth wall or door, and walk your feet out 18-24 inches (46-61 cm) from it. 2. Place your feet hip-width apart. 3.   Slowly slide down the wall or door until your knees bend __________ degrees. Keep your knees over your heels, not over your toes. Keep your knees in line with your hips. 4. Hold this position for __________ seconds. Repeat __________ times. Complete this exercise __________ times a day. Straight leg raises This exercise strengthens the muscles that rotate the leg at the hip and move it away from your body (hip abductors). 1. Lie on your side with your left / right leg in the top position. Lie so your head, shoulder, knee, and hip line up. You may bend your bottom knee to help you keep your balance. 2. Roll your hips slightly forward so your hips are stacked directly over each other and your left / right knee is facing forward. 3. Leading with your heel, lift your top leg 4-6 inches (10-15 cm). You should feel the muscles in your outer hip lifting. ? Do not let your foot drift forward. ? Do not let your knee  roll toward the ceiling. 4. Hold this position for __________ seconds. 5. Slowly return your leg to the starting position. 6. Let your muscles relax completely after each repetition. Repeat __________ times. Complete this exercise __________ times a day. Straight leg raises This exercise stretches the muscles that move your hips away from the front of the pelvis (hip extensors). 1. Lie on your abdomen on a firm surface. You can put a pillow under your hips if that is more comfortable. 2. Tense the muscles in your buttocks and lift your left / right leg about 4-6 inches (10-15 cm). Keep your knee straight as you lift your leg. 3. Hold this position for __________ seconds. 4. Slowly lower your leg to the starting position. 5. Let your leg relax completely after each repetition. Repeat __________ times. Complete this exercise __________ times a day. This information is not intended to replace advice given to you by your health care provider. Make sure you discuss any questions you have with your health care provider. Document Revised: 11/06/2017 Document Reviewed: 11/06/2017 Elsevier Patient Education  [place] [facility] Ask your health care provider which exercises are safe for you. Do exercises exactly as told by your health care provider and adjust them as directed. It is normal to feel mild stretching, pulling, tightness, or discomfort as you do these exercises. Stop right away if you feel sudden pain or your pain gets significantly worse. Do not begin these exercises until told by your health care provider. Stretching and range-of-motion exercises These exercises warm up your muscles and joints and improve the movement and flexibility of your hip and pelvis. Quadriceps stretch, prone  6. Lie on your abdomen on a firm surface, such as a bed or padded floor (prone position). 7. Bend your left / right knee and reach back to hold your ankle or pant leg. If you  cannot reach your ankle or pant leg, loop a belt around your foot and grab the belt instead. 8. Gently pull your heel toward your buttocks. Your knee should not slide out to the side. You should feel a stretch in the front of your thigh and knee (quadriceps). 9. Hold this position for __________ seconds. Repeat __________ times. Complete this exercise __________ times a day. Iliotibial band stretch An iliotibial band is a strong band of muscle tissue that runs from the outer side of your hip to the outer side of your thigh and knee. 5. Lie on your side with your left /  right leg in the top position. 6. Bend both of your knees and grab your left / right ankle. Stretch out your bottom arm to help you balance. 7. Slowly bring your top knee back so your thigh goes behind your trunk. 8. Slowly lower your top leg toward the floor until you feel a gentle stretch on the outside of your left / right hip and thigh. If you do not feel a stretch and your knee will not fall farther, place the heel of your other foot on top of your knee and pull your knee down toward the floor with your foot. 9. Hold this position for __________ seconds. Repeat __________ times. Complete this exercise __________ times a day. Strengthening exercises These exercises build strength and endurance in your hip and pelvis. Endurance is the ability to use your muscles for a long time, even after they get tired. Straight leg raises, side-lying This exercise strengthens the muscles that rotate the leg at the hip and move it away from your body (hip abductors). 5. Lie on your side with your left / right leg in the top position. Lie so your head, shoulder, hip, and knee line up. You may bend your bottom knee to help you balance. 6. Roll your hips slightly forward so your hips are stacked directly over each other and your left / right knee is facing forward. 7. Tense the muscles in your outer thigh and lift your top leg 4-6 inches (10-15  cm). 8. Hold this position for __________ seconds. 9. Slowly return to the starting position. Let your muscles relax completely before doing another repetition. Repeat __________ times. Complete this exercise __________ times a day. Leg raises, prone This exercise strengthens the muscles that move the hips (hip extensors). 5. Lie on your abdomen on your bed or a firm surface. You can put a pillow under your hips if that is more comfortable for your lower back. 6. Bend your left / right knee so your foot is straight up in the air. 7. Squeeze your buttocks muscles and lift your left / right thigh off the bed. Do not let your back arch. 8. Tense your thigh muscle as hard as you can without increasing any knee pain. 9. Hold this position for __________ seconds. 10. Slowly lower your leg to the starting position and allow it to relax completely. Repeat __________ times. Complete this exercise __________ times a day. Hip hike 5. Stand sideways on a bottom step. Stand on your left / right leg with your other foot unsupported next to the step. You can hold on to the railing or wall for balance if needed. 6. Keep your knees straight and your torso square. Then lift your left / right hip up toward the ceiling. 7. Slowly let your left / right hip lower toward the floor, past the starting position. Your foot should get closer to the floor. Do not lean or bend your knees. Repeat __________ times. Complete this exercise __________ times a day. This information is not intended to replace advice given to you by your health care provider. Make sure you discuss any questions you have with your health care provider. Document Revised: 05/09/2018 Document Reviewed: 11/07/2017 Elsevier Patient Education  [place].

## 2019-04-04 ENCOUNTER — Encounter: Payer: Self-pay | Admitting: Podiatry

## 2019-04-07 NOTE — Telephone Encounter (Signed)
Called and spoke with patient regarding rescheduling her appt to May from June. Pt was unable to r/s at the moment but stated she would call back later.

## 2019-04-28 ENCOUNTER — Other Ambulatory Visit: Payer: Self-pay | Admitting: Family Medicine

## 2019-04-28 ENCOUNTER — Encounter: Payer: Self-pay | Admitting: Family Medicine

## 2019-04-28 DIAGNOSIS — G4700 Insomnia, unspecified: Secondary | ICD-10-CM

## 2019-04-28 NOTE — Telephone Encounter (Signed)
Last Rx given on 9/30 for #90 with 1 ref

## 2019-04-29 ENCOUNTER — Ambulatory Visit: Payer: Medicare Other | Admitting: Rheumatology

## 2019-05-03 ENCOUNTER — Other Ambulatory Visit: Payer: Self-pay | Admitting: Family Medicine

## 2019-05-03 DIAGNOSIS — E785 Hyperlipidemia, unspecified: Secondary | ICD-10-CM

## 2019-05-03 DIAGNOSIS — E1169 Type 2 diabetes mellitus with other specified complication: Secondary | ICD-10-CM

## 2019-05-08 ENCOUNTER — Telehealth: Payer: Self-pay

## 2019-05-08 ENCOUNTER — Telehealth: Payer: Self-pay | Admitting: Orthopaedic Surgery

## 2019-05-08 NOTE — Telephone Encounter (Signed)
Last injection was 11/14/18. 6 months- 05/15/19.   Submitted for VOB for Synvisc-Left knee

## 2019-05-08 NOTE — Telephone Encounter (Signed)
Please get precert for left knee synvisc injections. This is Dr.Whitfield's patient.

## 2019-05-08 NOTE — Telephone Encounter (Signed)
Patient call she would like a gel injection in her knee. Her call back number is [phone]

## 2019-05-08 NOTE — Telephone Encounter (Signed)
Patient notified that we are sending off for precert of Synvisc for her left knee.

## 2019-05-08 NOTE — Telephone Encounter (Signed)
Yes if it has been 6 months

## 2019-05-08 NOTE — Telephone Encounter (Signed)
Are you okay with getting precert for Synvisc again?

## 2019-05-12 ENCOUNTER — Telehealth: Payer: Self-pay

## 2019-05-12 NOTE — Telephone Encounter (Signed)
Approved for Synvisc-Left knee Dr. Vicenta Dunning and Bill Covered @ 100% No prior auth required   Last injection was 11/14/18. 34months=05/15/19. Needs appointment after 05/15/19

## 2019-05-12 NOTE — Telephone Encounter (Signed)
Pt informed and stated understanding  She was driving and unable to schedule. She stated she will Call back. She was informed that she will need schedule appointment for after 05/15/19.

## 2019-05-20 ENCOUNTER — Encounter: Payer: Self-pay | Admitting: Family Medicine

## 2019-05-20 MED ORDER — ALPRAZOLAM 1 MG PO TABS: 1.0000 mg | ORAL_TABLET | Freq: Every day | ORAL | 1 refills | Status: DC | PRN

## 2019-05-21 ENCOUNTER — Encounter: Payer: Self-pay | Admitting: Family Medicine

## 2019-05-21 ENCOUNTER — Ambulatory Visit
Admission: RE | Admit: 2019-05-21 | Discharge: 2019-05-21 | Disposition: A | Discharge status: Home / Self Care | Payer: Medicare Other | Source: Ambulatory Visit | Attending: Family Medicine | Admitting: Family Medicine

## 2019-05-21 ENCOUNTER — Other Ambulatory Visit: Payer: Self-pay

## 2019-05-21 ENCOUNTER — Ambulatory Visit (INDEPENDENT_AMBULATORY_CARE_PROVIDER_SITE_OTHER): Payer: Medicare Other | Admitting: Family Medicine

## 2019-05-21 VITALS — BP 110/80 | HR 64 | Temp 97.9°F

## 2019-05-21 DIAGNOSIS — F322 Major depressive disorder, single episode, severe without psychotic features: Secondary | ICD-10-CM | POA: Diagnosis not present

## 2019-05-21 DIAGNOSIS — F419 Anxiety disorder, unspecified: Secondary | ICD-10-CM

## 2019-05-21 DIAGNOSIS — M8580 Other specified disorders of bone density and structure, unspecified site: Secondary | ICD-10-CM

## 2019-05-21 MED ORDER — ESCITALOPRAM OXALATE 10 MG PO TABS: 10.0000 mg | ORAL_TABLET | Freq: Every day | ORAL | 2 refills | Status: DC

## 2019-05-21 MED ORDER — BUPROPION HCL ER (XL) 150 MG PO TB24: 150.0000 mg | ORAL_TABLET | Freq: Every day | ORAL | 2 refills | Status: DC

## 2019-05-21 NOTE — Progress Notes (Signed)
[place] DOB: 1951-01-28 Encounter date: 05/21/2019  This is a 69 y.o. female who presents with Chief Complaint  Patient presents with  . Depression    History of present illness: She has been really with mood lately.  [place] very [place] from her as well as loss of her mother.  She misses living by her sister.  She misses having her friends nearby.  Most recently, her husband has had a double knee replacement, which did not go smoothly as they hoped to.Marland Kitchen  His recovery has been slowed by delay with physical therapy and pain.  She is having to do everything around the house and has been a lot of increased stress for her.  In past was on lexapro 40mg  in past. Dry mouth was biggest issue with medication.   Has tried wellbutrin, prozac. Has had dry mouth with pretty much all medications, but did get some relief with depression/anxiety with medications in the past.   Only way to sleep is with zolpidem or alprazolam but tries to limit these.  Mind is racing.   Is walking regularly and exercising at gym. She is praying and reading the bible. Hard to focus.    Allergies  Allergen Reactions  . Clindamycin Hcl Rash   Current Meds  Medication Sig  . ALPRAZolam (XANAX) 1 MG tablet Take 1 tablet (1 mg total) by mouth daily as needed for anxiety.  Marland Kitchen CALCIUM CITRATE PO Take 600 mg by mouth daily.  . Cholecalciferol (VITAMIN D3 PO) Take 2,000 Units by mouth daily.  Marland Kitchen esomeprazole (NEXIUM) 40 MG capsule Take 1 capsule (40 mg total) by mouth daily.  . fluticasone (FLONASE) 50 MCG/ACT nasal spray   . gabapentin (NEURONTIN) 100 MG capsule Take 1 capsule (100 mg total) by mouth 3 (three) times daily.  . Glucosamine-Chondroitin (GLUCOSAMINE CHONDR COMPLEX PO) Take by mouth.  . meloxicam (MOBIC) 7.5 MG tablet Take 1 tablet (7.5 mg total) by mouth 2 (two) times daily as needed for pain.  Marland Kitchen METRONIDAZOLE, TOPICAL, 0.75 % LOTN   . rosuvastatin (CRESTOR) 10 MG tablet  TAKE 1 TABLET AT BEDTIME  . TURMERIC CURCUMIN PO Take 500 mg by mouth daily.  Marland Kitchen zolpidem (AMBIEN CR) 6.25 MG CR tablet TAKE 1 TABLET AT BEDTIME AS NEEDED FOR SLEEP    Review of Systems  Constitutional: Negative for chills, fatigue and fever.  Respiratory: Negative for cough, chest tightness, shortness of breath and wheezing.   Cardiovascular: Negative for chest pain, palpitations and leg swelling.  Psychiatric/Behavioral: Positive for agitation, decreased concentration, sleep disturbance and suicidal ideas (has had these thoughts recently and in past. usually come at night when lying in bed. recurring thought was crashing [place] no specific current plan. would not want to cause family pain.). The patient is nervous/anxious.     Objective:  BP 110/80 (BP Location: Left Arm, Patient Position: Sitting, Cuff Size: Normal)   Pulse 64   Temp 97.9 F (36.6 C) (Temporal)       BP Readings from Last 3 Encounters:  05/21/19 110/80  04/01/19 127/81  12/20/18 108/78   Wt Readings from Last 3 Encounters:  04/01/19 158 lb 3.2 oz (71.8 kg)  03/18/19 155 lb (70.3 kg)  12/20/18 155 lb 9.6 oz (70.6 kg)    Physical Exam Constitutional:      General: She is not in acute distress.    Appearance: She is well-developed.  Cardiovascular:  Rate and Rhythm: Normal rate and regular rhythm.     Heart sounds: Normal heart sounds. No murmur. No friction rub.  Pulmonary:     Effort: Pulmonary effort is normal. No respiratory distress.     Breath sounds: Normal breath sounds. No wheezing or rales.  Musculoskeletal:     Right lower leg: No edema.     Left lower leg: No edema.  Neurological:     Mental Status: She is alert and oriented to person, place, and time.  Psychiatric:        Attention and Perception: Attention normal.        Mood and Affect: Mood is anxious and depressed.        Speech: Speech normal.        Behavior: Behavior normal.         Thought Content: Thought content normal. Thought content does not include suicidal ideation. Thought content does not include suicidal plan.        Judgment: Judgment normal.    Depression screen [facility] 2/9 05/21/2019 12/20/2018 08/29/2018  Decreased Interest 3 2 1   Down, Depressed, Hopeless 3 1 1   PHQ - 2 Score 6 3 2   Altered sleeping 3 3 2   Tired, decreased energy 3 2 0  Change in appetite 3 2 0  Feeling bad or failure about yourself  3 1 1   Trouble concentrating 3 2 0  Moving slowly or fidgety/restless 3 1 0  Suicidal thoughts 3 0 0  PHQ-9 Score 27 14 5   Difficult doing work/chores Extremely dIfficult Somewhat difficult -   GAD 7 : Generalized Anxiety Score 05/21/2019 08/29/2018  Nervous, Anxious, on Edge 3 1  Control/stop worrying 3 2  Worry too much - different things 3 2  Trouble relaxing 3 1  Restless 0 1  Easily annoyed or irritable 3 0  Afraid - awful might happen 3 1  Total GAD 7 Score 18 8  Anxiety Difficulty Very difficult -      Assessment/Plan  1. Anxiety She has done well in past with lexapro although she did have the effect of dry mouth.  She is willing to tolerate the side effect and to feel better.  She would consider therapy, but has a hard time with the concept of doing virtual therapy.  We are going to restart Lexapro and if tolerating she can add Wellbutrin to help manage depression focus.  Previously she was on 40 mg of Lexapro, which we discussed is above the typical therapeutic window.  She will update me next week with status and we will plan to do a 2-week follow-up.  She does verbally contract for safety feels comfortable reaching out if she has any worsening of mood. - escitalopram (LEXAPRO) 10 MG tablet; Take 1 tablet (10 mg total) by mouth daily.  Dispense: 30 tablet; Refill: 2 - buPROPion (WELLBUTRIN XL) 150 MG 24 hr tablet; Take 1 tablet (150 mg total) by mouth daily.  Dispense: 30 tablet; Refill: 2  2. Depression, major, single episode, severe  ([place]) See above. - escitalopram (LEXAPRO) 10 MG tablet; Take 1 tablet (10 mg total) by mouth daily.  Dispense: 30 tablet; Refill: 2 - buPROPion (WELLBUTRIN XL) 150 MG 24 hr tablet; Take 1 tablet (150 mg total) by mouth daily.  Dispense: 30 tablet; Refill: 2    Return in about 2 weeks (around 06/04/2019) for Chronic condition visit (ok virtual).     Micheline Rough, MD

## 2019-05-21 NOTE — Patient Instructions (Signed)
Please update me with any concerns with medication and also let me know how you are doing with new medications next week.   I would suggest starting with lexapro and if tolerating for the first few days; then add wellbutrin.

## 2019-05-22 NOTE — Progress Notes (Signed)
DXA is in osteopenia range.  We can continue to hold Reclast.  Plan to repeat DXA in 2 years. Thank you

## 2019-05-29 ENCOUNTER — Encounter: Payer: Self-pay | Admitting: Family Medicine

## 2019-06-04 ENCOUNTER — Ambulatory Visit: Payer: Medicare Other | Admitting: Podiatry

## 2019-06-04 ENCOUNTER — Encounter: Payer: Self-pay | Admitting: Family Medicine

## 2019-06-10 ENCOUNTER — Other Ambulatory Visit: Payer: Self-pay

## 2019-06-11 ENCOUNTER — Encounter: Payer: Self-pay | Admitting: Family Medicine

## 2019-06-11 ENCOUNTER — Ambulatory Visit (INDEPENDENT_AMBULATORY_CARE_PROVIDER_SITE_OTHER): Payer: Medicare Other | Admitting: Family Medicine

## 2019-06-11 DIAGNOSIS — F419 Anxiety disorder, unspecified: Secondary | ICD-10-CM | POA: Diagnosis not present

## 2019-06-11 DIAGNOSIS — F322 Major depressive disorder, single episode, severe without psychotic features: Secondary | ICD-10-CM | POA: Diagnosis not present

## 2019-06-11 MED ORDER — ESCITALOPRAM OXALATE 10 MG PO TABS: 10.0000 mg | ORAL_TABLET | Freq: Every day | ORAL | 3 refills | Status: DC

## 2019-06-11 NOTE — Progress Notes (Signed)
Friant DOB: 01-Apr-1950 Encounter date: 06/11/2019  This is a 69 y.o. female who presents with Chief Complaint  Patient presents with  . Anxiety    Anxiety f/u. Pt is not taking Wellbutrin but is currently taking Lexapro. Pt stated that she was sleepy when she first started Lexapro but is tolerated now.     History of present illness: At last visit we started lexapro to help with mood. Felt groggy/sleepy when she started the lexapro. Ended up starting the wellbutrin and took it for 3 days, but couldn't sleep for 3 nights and was getting muscle twitches with it.  Got first COVID shot yesterday.  Husband released for driving and doing more around house.   Feels better overall. Some improvement with concentration. Made self go to gym twice last week. Has been walking daily. Hard to get back in to making meals.    Allergies  Allergen Reactions  . Wellbutrin [Bupropion]     Muscle twitches  . Clindamycin Hcl Rash   Current Meds  Medication Sig  . ALPRAZolam (XANAX) 1 MG tablet Take 1 tablet (1 mg total) by mouth daily as needed for anxiety. (Patient taking differently: Take 1 mg by mouth as needed for anxiety. )  . CALCIUM CITRATE PO Take 600 mg by mouth daily.  . Cholecalciferol (VITAMIN D3 PO) Take 2,000 Units by mouth daily.  Marland Kitchen escitalopram (LEXAPRO) 10 MG tablet Take 1 tablet (10 mg total) by mouth daily.  Marland Kitchen esomeprazole (NEXIUM) 40 MG capsule Take 1 capsule (40 mg total) by mouth daily.  . fluticasone (FLONASE) 50 MCG/ACT nasal spray   . gabapentin (NEURONTIN) 100 MG capsule Take 1 capsule (100 mg total) by mouth 3 (three) times daily.  . Glucosamine-Chondroitin (GLUCOSAMINE CHONDR COMPLEX PO) Take by mouth.  . meloxicam (MOBIC) 7.5 MG tablet Take 1 tablet (7.5 mg total) by mouth 2 (two) times daily as needed for pain.  Marland Kitchen METRONIDAZOLE, TOPICAL, 0.75 % LOTN as needed.   . rosuvastatin (CRESTOR) 10 MG tablet TAKE 1 TABLET AT BEDTIME  . TURMERIC CURCUMIN PO Take 500  mg by mouth daily.  Marland Kitchen zolpidem (AMBIEN CR) 6.25 MG CR tablet TAKE 1 TABLET AT BEDTIME AS NEEDED FOR SLEEP (Patient taking differently: as needed. )  . [DISCONTINUED] escitalopram (LEXAPRO) 10 MG tablet Take 1 tablet (10 mg total) by mouth daily.    Review of Systems  Constitutional: Negative for chills, fatigue and fever.  Respiratory: Negative for cough, chest tightness, shortness of breath and wheezing.   Cardiovascular: Negative for chest pain, palpitations and leg swelling.  Psychiatric/Behavioral: Negative for decreased concentration (better). Nervous/anxious: improved.     Objective:  BP 120/82   Pulse 71   Temp 97.9 F (36.6 C) (Other (Comment))   Ht 5' 1.5" (1.562 m)   Wt 158 lb (71.7 kg)   SpO2 97%   BMI 29.37 kg/m   Weight: 158 lb (71.7 kg)   BP Readings from Last 3 Encounters:  06/11/19 120/82  05/21/19 110/80  04/01/19 127/81   Wt Readings from Last 3 Encounters:  06/11/19 158 lb (71.7 kg)  04/01/19 158 lb 3.2 oz (71.8 kg)  03/18/19 155 lb (70.3 kg)    Physical Exam Constitutional:      General: She is not in acute distress.    Appearance: She is well-developed.  Cardiovascular:     Rate and Rhythm: Normal rate and regular rhythm.     Heart sounds: Normal heart sounds. No murmur. No friction rub.  Pulmonary:     Effort: Pulmonary effort is normal. No respiratory distress.     Breath sounds: Normal breath sounds. No wheezing or rales.  Musculoskeletal:     Right lower leg: No edema.     Left lower leg: No edema.  Neurological:     Mental Status: She is alert and oriented to person, place, and time.  Psychiatric:        Behavior: Behavior normal.     Comments: improved     Assessment/Plan  1. Anxiety Improved with lexapro.  - escitalopram (LEXAPRO) 10 MG tablet; Take 1 tablet (10 mg total) by mouth daily.  Dispense: 90 tablet; Refill: 3  2. Depression, major, single episode, severe ([place]) Improved. - escitalopram (LEXAPRO) 10 MG tablet; Take 1  tablet (10 mg total) by mouth daily.  Dispense: 90 tablet; Refill: 3    Return in about 3 months (around 09/11/2019) for Chronic condition visit.     Micheline Rough, MD

## 2019-06-12 ENCOUNTER — Encounter: Payer: Self-pay | Admitting: Orthopaedic Surgery

## 2019-06-12 ENCOUNTER — Ambulatory Visit (INDEPENDENT_AMBULATORY_CARE_PROVIDER_SITE_OTHER): Payer: Medicare Other | Admitting: Orthopaedic Surgery

## 2019-06-12 ENCOUNTER — Other Ambulatory Visit: Payer: Self-pay

## 2019-06-12 DIAGNOSIS — M1712 Unilateral primary osteoarthritis, left knee: Secondary | ICD-10-CM

## 2019-06-12 NOTE — Progress Notes (Signed)
   Office Visit Note   Patient: Renee Bauer           Date of Birth: 26-Jul-1950           MRN: [id number] Visit Date: 06/12/2019              Requested by: Caren Macadam, MD [place],  [place] [id number] PCP: Caren Macadam, MD   Assessment & Plan: Visit Diagnoses:  1. Unilateral primary osteoarthritis, left knee     Plan: First Synvisc injection left knee. Return weekly for the next 2 weeks to complete the series  Follow-Up Instructions: Return in about 1 week (around 06/19/2019).   Orders:  No orders of the defined types were placed in this encounter.  No orders of the defined types were placed in this encounter.     Procedures: No procedures performed   Clinical Data: No additional findings.   Subjective: Chief Complaint  Patient presents with  . Left Knee - Pain  Renee Bauer is here today to start the Synvisc injections in her left knee  HPI  Review of Systems   Objective: Vital Signs: There were no vitals taken for this visit.  Physical Exam  Ortho Exam left knee was not hot warm red or swollen. No effusion. Mostly medial joint pain. Full extension of flexed over 100 without instability  Specialty Comments:  No specialty comments available.  Imaging: No results found.   PMFS History: Patient Active Problem List   Diagnosis Date Noted  . Unilateral primary osteoarthritis, left knee 10/31/2018  . Arthritis 10/30/2018  . DM (diabetes mellitus) type II controlled, neurological manifestation ([place]) 10/30/2018  . Neuropathy of both feet 10/30/2018  . Anxiety 10/30/2018  . Insomnia 10/30/2018  . History of skin cancer 10/30/2018  . Osteopenia 09/30/2018   Past Medical History:  Diagnosis Date  . Anxiety   . Arthritis   . Benign colon polyp 2018  . Bursitis   . Depression   . Diabetes mellitus without complication ([place])   . GERD (gastroesophageal reflux disease)   . Hyperlipidemia   . Rosacea     Family  History  Problem Relation Age of Onset  . Diabetes Mother   . Arrhythmia Mother   . High blood pressure Mother   . Mesothelioma Father        asbestos exposure  . Healthy Sister   . Brain cancer Maternal Aunt   . Post-traumatic stress disorder Son   . Scoliosis Son     Past Surgical History:  Procedure Laterality Date  . ABDOMINAL HYSTERECTOMY     menorrhagia  . BLADDER REPAIR    . BUNIONECTOMY Right   . COLON RESECTION  2007  . DILATION AND CURETTAGE OF UTERUS     Social History   Occupational History  . Not on file  Tobacco Use  . Smoking status: Never Smoker  . Smokeless tobacco: Never Used  Substance and Sexual Activity  . Alcohol use: Yes    Comment: occasional wine  . Drug use: Never  . Sexual activity: Not on file     Renee Balding, MD   Note - This record has been created using [employer].  Chart creation errors have been sought, but may not always  have been located. Such creation errors do not reflect on  the standard of medical care.

## 2019-06-19 ENCOUNTER — Encounter: Payer: Self-pay | Admitting: Orthopedic Surgery

## 2019-06-19 ENCOUNTER — Other Ambulatory Visit: Payer: Self-pay

## 2019-06-19 ENCOUNTER — Ambulatory Visit (INDEPENDENT_AMBULATORY_CARE_PROVIDER_SITE_OTHER): Payer: Medicare Other | Admitting: Orthopedic Surgery

## 2019-06-19 VITALS — Ht 61.5 in | Wt 158.0 lb

## 2019-06-19 DIAGNOSIS — M1712 Unilateral primary osteoarthritis, left knee: Secondary | ICD-10-CM

## 2019-06-19 MED ORDER — LIDOCAINE HCL 1 % IJ SOLN
2.0000 mL | INTRAMUSCULAR | Status: AC | PRN
  Administered 2019-06-19: 2 mL

## 2019-06-19 MED ORDER — HYLAN G-F 20 16 MG/2ML IX SOSY
16.0000 mg | PREFILLED_SYRINGE | INTRA_ARTICULAR | Status: AC | PRN
  Administered 2019-06-19: 16 mg via INTRA_ARTICULAR

## 2019-06-19 NOTE — Progress Notes (Signed)
Office Visit Note   Patient: Renee Bauer           Date of Birth: Jul 08, 1950           MRN: [id number] Visit Date: 06/19/2019              Requested by: Caren Macadam, MD [place],  [place] [id number] PCP: Caren Macadam, MD   Assessment & Plan: Visit Diagnoses:  1. Unilateral primary osteoarthritis, left knee     Plan:  #1: Second Synvisc injection was given without difficulty. #2: Follow back up 1 week for the third Synvisc injection.  Follow-Up Instructions: No follow-ups on file.   Orders:  No orders of the defined types were placed in this encounter.  No orders of the defined types were placed in this encounter.     Procedures: Large Joint Inj: L knee on 06/19/2019 12:50 PM Indications: pain and joint swelling Details: 25 G 1.5 in needle, anteromedial approach  Arthrogram: No  Medications: 2 mL lidocaine 1 %; 16 mg Hylan 16 MG/2ML Outcome: tolerated well, no immediate complications Procedure, treatment alternatives, risks and benefits explained, specific risks discussed. Consent was given by the patient. Immediately prior to procedure a time out was called to verify the correct patient, procedure, equipment, support staff and site/side marked as required. Patient was prepped and draped in the usual sterile fashion.       Clinical Data: No additional findings.   Subjective: Chief Complaint  Patient presents with  . Left Knee - Pain    Synvisc started 06/12/2019  Patient presents today for her second Synvisc injection in her left knee. She started the injections on 06/12/2019. She has not noticed any changes in her knee thus far.  HPI  Review of Systems   Objective: Vital Signs: Ht 5' 1.5" (1.562 m)   Wt 158 lb (71.7 kg)   BMI 29.37 kg/m   Physical Exam  Ortho Exam  Specialty Comments:  No specialty comments available.  Imaging: No results found.   PMFS History: Patient Active Problem List   Diagnosis  Date Noted  . Unilateral primary osteoarthritis, left knee 10/31/2018  . Arthritis 10/30/2018  . DM (diabetes mellitus) type II controlled, neurological manifestation ([place]) 10/30/2018  . Neuropathy of both feet 10/30/2018  . Anxiety 10/30/2018  . Insomnia 10/30/2018  . History of skin cancer 10/30/2018  . Osteopenia 09/30/2018   Past Medical History:  Diagnosis Date  . Anxiety   . Arthritis   . Benign colon polyp 2018  . Bursitis   . Depression   . Diabetes mellitus without complication ([place])   . GERD (gastroesophageal reflux disease)   . Hyperlipidemia   . Rosacea     Family History  Problem Relation Age of Onset  . Diabetes Mother   . Arrhythmia Mother   . High blood pressure Mother   . Mesothelioma Father        asbestos exposure  . Healthy Sister   . Brain cancer Maternal Aunt   . Post-traumatic stress disorder Son   . Scoliosis Son     Past Surgical History:  Procedure Laterality Date  . ABDOMINAL HYSTERECTOMY     menorrhagia  . BLADDER REPAIR    . BUNIONECTOMY Right   . COLON RESECTION  2007  . DILATION AND CURETTAGE OF UTERUS     Social History   Occupational History  . Not on file  Tobacco Use  . Smoking status: Never Smoker  .  Smokeless tobacco: Never Used  Substance and Sexual Activity  . Alcohol use: Yes    Comment: occasional wine  . Drug use: Never  . Sexual activity: Not on file

## 2019-06-22 ENCOUNTER — Encounter: Payer: Self-pay | Admitting: Family Medicine

## 2019-06-22 DIAGNOSIS — M2550 Pain in unspecified joint: Secondary | ICD-10-CM

## 2019-06-23 MED ORDER — MELOXICAM 7.5 MG PO TABS: 7.5000 mg | ORAL_TABLET | Freq: Two times a day (BID) | ORAL | 0 refills | Status: DC | PRN

## 2019-06-25 ENCOUNTER — Other Ambulatory Visit: Payer: Self-pay

## 2019-06-25 ENCOUNTER — Encounter: Payer: Self-pay | Admitting: Orthopaedic Surgery

## 2019-06-25 ENCOUNTER — Ambulatory Visit (INDEPENDENT_AMBULATORY_CARE_PROVIDER_SITE_OTHER): Payer: Medicare Other | Admitting: Orthopaedic Surgery

## 2019-06-25 VITALS — Ht 61.5 in | Wt 158.0 lb

## 2019-06-25 DIAGNOSIS — M1712 Unilateral primary osteoarthritis, left knee: Secondary | ICD-10-CM | POA: Diagnosis not present

## 2019-06-25 MED ORDER — HYLAN G-F 20 16 MG/2ML IX SOSY
16.0000 mg | PREFILLED_SYRINGE | INTRA_ARTICULAR | Status: AC | PRN
Start: 2019-06-25 — End: 2019-06-25
  Administered 2019-06-25: 16 mg via INTRA_ARTICULAR

## 2019-06-25 NOTE — Progress Notes (Signed)
Office Visit Note   Patient: Renee Bauer           Date of Birth: May 07, 1950           MRN: [id number] Visit Date: 06/25/2019              Requested by: Caren Macadam, MD [place],  [place] [id number] PCP: Caren Macadam, MD   Assessment & Plan: Visit Diagnoses:  1. Unilateral primary osteoarthritis, left knee     Plan: Third Synvisc injection left knee.  Mrs. Marinelli feels like the injections have helped.  We will see her back as needed  Follow-Up Instructions: Return if symptoms worsen or fail to improve.   Orders:  Orders Placed This Encounter  Procedures  . Large Joint Inj: L knee   No orders of the defined types were placed in this encounter.     Procedures: Large Joint Inj: L knee on 06/25/2019 1:44 PM Indications: pain and joint swelling Details: 25 G 1.5 in needle, anteromedial approach  Arthrogram: No  Medications: 16 mg Hylan 16 MG/2ML Outcome: tolerated well, no immediate complications Procedure, treatment alternatives, risks and benefits explained, specific risks discussed. Consent was given by the patient. Immediately prior to procedure a time out was called to verify the correct patient, procedure, equipment, support staff and site/side marked as required. Patient was prepped and draped in the usual sterile fashion.       Clinical Data: No additional findings.   Subjective: Chief Complaint  Patient presents with  . Left Knee - Follow-up    Synvisc series started 06/12/2019  Patient presents today for the third Synvisc injection in her left knee. She started the series on 06/12/2019. She does feel like she has noticed improvement with the first two injections.  HPI  Review of Systems   Objective: Vital Signs: Ht 5' 1.5" (1.562 m)   Wt 158 lb (71.7 kg)   BMI 29.37 kg/m   Physical Exam  Ortho Exam left knee was not hot red warm or swollen.  No effusion.  No significant medial or lateral joint pain.  Walks  without a limp  Specialty Comments:  No specialty comments available.  Imaging: No results found.   PMFS History: Patient Active Problem List   Diagnosis Date Noted  . Unilateral primary osteoarthritis, left knee 10/31/2018  . Arthritis 10/30/2018  . DM (diabetes mellitus) type II controlled, neurological manifestation ([place]) 10/30/2018  . Neuropathy of both feet 10/30/2018  . Anxiety 10/30/2018  . Insomnia 10/30/2018  . History of skin cancer 10/30/2018  . Osteopenia 09/30/2018   Past Medical History:  Diagnosis Date  . Anxiety   . Arthritis   . Benign colon polyp 2018  . Bursitis   . Depression   . Diabetes mellitus without complication ([place])   . GERD (gastroesophageal reflux disease)   . Hyperlipidemia   . Rosacea     Family History  Problem Relation Age of Onset  . Diabetes Mother   . Arrhythmia Mother   . High blood pressure Mother   . Mesothelioma Father        asbestos exposure  . Healthy Sister   . Brain cancer Maternal Aunt   . Post-traumatic stress disorder Son   . Scoliosis Son     Past Surgical History:  Procedure Laterality Date  . ABDOMINAL HYSTERECTOMY     menorrhagia  . BLADDER REPAIR    . BUNIONECTOMY Right   . COLON RESECTION  2007  . DILATION AND CURETTAGE OF UTERUS     Social History   Occupational History  . Not on file  Tobacco Use  . Smoking status: Never Smoker  . Smokeless tobacco: Never Used  Substance and Sexual Activity  . Alcohol use: Yes    Comment: occasional wine  . Drug use: Never  . Sexual activity: Not on file

## 2019-07-06 DIAGNOSIS — M19041 Primary osteoarthritis, right hand: Secondary | ICD-10-CM | POA: Insufficient documentation

## 2019-07-06 DIAGNOSIS — M170 Bilateral primary osteoarthritis of knee: Secondary | ICD-10-CM | POA: Insufficient documentation

## 2019-07-06 NOTE — Progress Notes (Signed)
Office Visit Note  Patient: Renee Bauer             Date of Birth: 03/19/1950           MRN: [id number]             PCP: Caren Macadam, MD Referring: Caren Macadam, MD Visit Date: 07/17/2019 Occupation: @GUAROCC @  Subjective:  Joint pain and stiffness   History of Present Illness: Renee Bauer is a 69 y.o. female with history of osteoarthritis.  She states she continues to have some discomfort in her hands.  She also has discomfort in her knee joints and her feet.  She denies any joint swelling.  She has been doing some IT band exercises which we demonstrated at the last visit which has been helpful.  She has been walking on a regular basis.  She also had recent bone density which she wants to discuss today.  Activities of Daily Living:  Patient reports morning stiffness for 3 minutes.   Patient Reports nocturnal pain.  Difficulty dressing/grooming: Denies Difficulty climbing stairs: Denies Difficulty getting out of chair: Denies Difficulty using hands for taps, buttons, cutlery, and/or writing: Reports  Review of Systems  Constitutional: Negative for fatigue, night sweats, weight gain and weight loss.  HENT: Negative for mouth sores, trouble swallowing, trouble swallowing, mouth dryness and nose dryness.   Eyes: Negative for pain, redness, visual disturbance and dryness.  Respiratory: Negative for cough, shortness of breath and difficulty breathing.   Cardiovascular: Negative for chest pain, palpitations, hypertension, irregular heartbeat and swelling in legs/feet.  Gastrointestinal: Negative for blood in stool, constipation and diarrhea.  Endocrine: Positive for cold intolerance and heat intolerance. Negative for increased urination.  Genitourinary: Negative for difficulty urinating and vaginal dryness.  Musculoskeletal: Positive for arthralgias, joint pain and morning stiffness. Negative for joint swelling, myalgias, muscle weakness, muscle tenderness and  myalgias.  Skin: Negative for color change, rash, hair loss, skin tightness, ulcers and sensitivity to sunlight.  Allergic/Immunologic: Negative for susceptible to infections.  Neurological: Positive for numbness. Negative for dizziness, memory loss, night sweats and weakness.  Hematological: Negative for bruising/bleeding tendency and swollen glands.  Psychiatric/Behavioral: Positive for sleep disturbance. Negative for depressed mood. The patient is not nervous/anxious.     PMFS History:  Patient Active Problem List   Diagnosis Date Noted  . Primary osteoarthritis of both hands 07/06/2019  . Primary osteoarthritis of both knees 07/06/2019  . Unilateral primary osteoarthritis, left knee 10/31/2018  . Arthritis 10/30/2018  . DM (diabetes mellitus) type II controlled, neurological manifestation ([place]) 10/30/2018  . Neuropathy of both feet 10/30/2018  . Anxiety 10/30/2018  . Insomnia 10/30/2018  . History of skin cancer 10/30/2018  . Osteopenia 09/30/2018    Past Medical History:  Diagnosis Date  . Anxiety   . Arthritis   . Benign colon polyp 2018  . Bursitis   . Depression   . Diabetes mellitus without complication ([place])   . GERD (gastroesophageal reflux disease)   . Hyperlipidemia   . Rosacea     Family History  Problem Relation Age of Onset  . Diabetes Mother   . Arrhythmia Mother   . High blood pressure Mother   . Mesothelioma Father        asbestos exposure  . Healthy Sister   . Brain cancer Maternal Aunt   . Post-traumatic stress disorder Son   . Scoliosis Son    Past Surgical History:  Procedure Laterality Date  . ABDOMINAL  HYSTERECTOMY     menorrhagia  . BLADDER REPAIR    . BUNIONECTOMY Right   . COLON RESECTION  2007  . DILATION AND CURETTAGE OF UTERUS     Social History   Social History Narrative  . Not on file   Immunization History  Administered Date(s) Administered  . Fluad Quad(high Dose 65+) 11/04/2018  . Moderna SARS-COVID-2 Vaccination  06/10/2019  . Pneumococcal Conjugate-13 11/04/2018  . Td 07/19/2016     Objective: Vital Signs: BP 128/79 (BP Location: Left Arm, Patient Position: Sitting, Cuff Size: Normal)   Pulse 79   Ht 5\' 1"  (1.549 m)   Wt 156 lb 9.6 oz (71 kg)   HC 16" (40.6 cm)   BMI 29.59 kg/m    Physical Exam Vitals and nursing note reviewed.  Constitutional:      Appearance: She is well-developed.  HENT:     Head: Normocephalic and atraumatic.  Eyes:     Conjunctiva/sclera: Conjunctivae normal.  Cardiovascular:     Rate and Rhythm: Normal rate and regular rhythm.     Heart sounds: Normal heart sounds.  Pulmonary:     Effort: Pulmonary effort is normal.     Breath sounds: Normal breath sounds.  Abdominal:     General: Bowel sounds are normal.     Palpations: Abdomen is soft.  Musculoskeletal:     Cervical back: Normal range of motion.  Lymphadenopathy:     Cervical: No cervical adenopathy.  Skin:    General: Skin is warm and dry.     Capillary Refill: Capillary refill takes less than 2 seconds.  Neurological:     Mental Status: She is alert and oriented to person, place, and time.  Psychiatric:        Behavior: Behavior normal.      Musculoskeletal Exam: C-spine was in good range of motion.  Shoulder joints, elbow joints, wrist joints with good range of motion.  She has bilateral DIP and PIP thickening.  Hip joints, knee joints, ankles were in good range of motion.  She has no tenderness over MTPs or PIPs.  She had tenderness on palpation of bilateral trochanteric bursa.  CDAI Exam: CDAI Score: -- Patient Global: --; Provider Global: -- Swollen: --; Tender: -- Joint Exam 07/17/2019   No joint exam has been documented for this visit   There is currently no information documented on the homunculus. Go to the Rheumatology activity and complete the homunculus joint exam.  Investigation: No additional findings.  Imaging: No results found.  Recent Labs: Lab Results  Component  Value Date   WBC 5.8 11/04/2018   HGB 14.5 11/04/2018   PLT 206.0 11/04/2018   NA 140 11/04/2018   K 3.8 11/04/2018   CL 104 11/04/2018   CO2 30 11/04/2018   GLUCOSE 86 11/04/2018   BUN 12 11/04/2018   CREATININE 0.84 11/04/2018   BILITOT 0.4 03/17/2019   ALKPHOS 67 11/04/2018   AST 14 03/17/2019   ALT 12 03/17/2019   PROT 6.8 03/17/2019   ALBUMIN 4.2 11/04/2018   CALCIUM 9.5 11/04/2018    Speciality Comments: No specialty comments available.  Procedures:  No procedures performed Allergies: Wellbutrin [bupropion] and Clindamycin hcl   Assessment / Plan:   Visit Diagnoses:   Primary osteoarthritis of both hands-joint protection muscle strengthening was discussed.  Exercises were demonstrated and a handout was given.  She had left wrist joint scaphoid and lunate narrowing.  She states she continues to have some discomfort in that area.  I will refer her to hand surgery.     Trochanteric bursitis of both hips-she has bilateral trochanteric bursitis.  IT band exercises were demonstrated in the office and a handout was given.  Primary osteoarthritis of both knees - Right knee moderate osteoarthritis, left knee severe osteoarthritis.  Bilateral moderate chondromalacia patella.  Followed by Dr. Durward Fortes.  A handout on knee joint exercises was given.  Natural anti-inflammatories were discussed.  Primary osteoarthritis of both feet-she has osteoarthritis in her feet which causes discomfort.  Exercises were demonstrated in the office.  Neuropathy of both feet-she has pain from neuropathy.  Osteopenia of multiple sites - Treated with Reclast in the past.  April 21, 2021DXA T score -1.3.  I detailed discussion regarding the DEXA scan.  At this time the treatment will be calcium, vitamin D and resistive exercises.  She will need repeat bone density in 2 years.  Controlled type 2 diabetes mellitus with diabetic polyneuropathy, without long-term current use of insulin ([facility])  History of  hyperlipidemia  History of gastroesophageal reflux (GERD)  Anxiety and depression  History of skin cancer  Other insomnia  Hx of colonic polyps  Orders: Orders Placed This Encounter  Procedures  . Ambulatory referral to Hand Surgery   No orders of the defined types were placed in this encounter.  .  Follow-Up Instructions: Return in about 1 year (around 07/16/2020) for [place].   Bo Merino, MD  Note - This record has been created using [employer].  Chart creation errors have been sought, but may not always  have been located. Such creation errors do not reflect on  the standard of medical care.

## 2019-07-09 ENCOUNTER — Ambulatory Visit (INDEPENDENT_AMBULATORY_CARE_PROVIDER_SITE_OTHER): Payer: Medicare Other | Admitting: Podiatry

## 2019-07-09 ENCOUNTER — Other Ambulatory Visit: Payer: Self-pay

## 2019-07-09 DIAGNOSIS — M79675 Pain in left toe(s): Secondary | ICD-10-CM | POA: Diagnosis not present

## 2019-07-09 DIAGNOSIS — E1142 Type 2 diabetes mellitus with diabetic polyneuropathy: Secondary | ICD-10-CM | POA: Diagnosis not present

## 2019-07-09 DIAGNOSIS — Q666 Other congenital valgus deformities of feet: Secondary | ICD-10-CM

## 2019-07-09 DIAGNOSIS — M79674 Pain in right toe(s): Secondary | ICD-10-CM | POA: Diagnosis not present

## 2019-07-09 DIAGNOSIS — B351 Tinea unguium: Secondary | ICD-10-CM

## 2019-07-10 ENCOUNTER — Encounter: Payer: Self-pay | Admitting: Podiatry

## 2019-07-10 ENCOUNTER — Ambulatory Visit (INDEPENDENT_AMBULATORY_CARE_PROVIDER_SITE_OTHER): Payer: Medicare Other | Admitting: Orthotics

## 2019-07-10 DIAGNOSIS — G5751 Tarsal tunnel syndrome, right lower limb: Secondary | ICD-10-CM | POA: Diagnosis not present

## 2019-07-10 DIAGNOSIS — G5752 Tarsal tunnel syndrome, left lower limb: Secondary | ICD-10-CM | POA: Diagnosis not present

## 2019-07-10 NOTE — Progress Notes (Signed)
Patient is here today to be evaluated and cast for CMFO.  Patient has hx of hallux rigidus R , and needs a supportive orthoses that will limit motion 1st mpj L.  Plan on mortons extension

## 2019-07-10 NOTE — Progress Notes (Signed)
Subjective:  Patient ID: [facility], female    DOB: October 09, 1950,  MRN: [id number]  Chief Complaint  Patient presents with  . Nail Problem    pt is here for routine foot care, pt is also here for a nail trim, as well as a possible f/u for lamisil therapy    69 y.o. female presents with the above complaint.  Patient presents with a follow-up of thickened elongated distal color dystrophic painful toenails x10.  She is unable to debride on herself.  She would like for [place] to have them debrided down.  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  She would like to know if she could obtain orthotics to help control that.  She denies any other acute complaints.  She has not seen anyone else prior to seeing me for the orthotics.   Review of Systems: Negative except as noted in the HPI. Denies N/V/F/Ch.  Past Medical History:  Diagnosis Date  . Anxiety   . Arthritis   . Benign colon polyp 2018  . Bursitis   . Depression   . Diabetes mellitus without complication ([place])   . GERD (gastroesophageal reflux disease)   . Hyperlipidemia   . Rosacea     Current Outpatient Medications:  .  ALPRAZolam (XANAX) 1 MG tablet, Take 1 tablet (1 mg total) by mouth daily as needed for anxiety. (Patient taking differently: Take 1 mg by mouth as needed for anxiety. ), Disp: 30 tablet, Rfl: 1 .  CALCIUM CITRATE PO, Take 600 mg by mouth daily., Disp: , Rfl:  .  Cholecalciferol (VITAMIN D3 PO), Take 2,000 Units by mouth daily., Disp: , Rfl:  .  escitalopram (LEXAPRO) 10 MG tablet, Take 1 tablet (10 mg total) by mouth daily., Disp: 90 tablet, Rfl: 3 .  esomeprazole (NEXIUM) 40 MG capsule, Take 1 capsule (40 mg total) by mouth daily., Disp: 90 capsule, Rfl: 3 .  fluticasone (FLONASE) 50 MCG/ACT nasal spray, , Disp: , Rfl:  .  gabapentin (NEURONTIN) 100 MG capsule, Take 1 capsule (100 mg total) by mouth 3 (three) times daily., Disp: 90 capsule, Rfl: 3 .   Glucosamine-Chondroitin (GLUCOSAMINE CHONDR COMPLEX PO), Take by mouth., Disp: , Rfl:  .  meloxicam (MOBIC) 7.5 MG tablet, Take 1 tablet (7.5 mg total) by mouth 2 (two) times daily as needed for pain., Disp: 180 tablet, Rfl: 0 .  METRONIDAZOLE, TOPICAL, 0.75 % LOTN, as needed. , Disp: , Rfl:  .  rosuvastatin (CRESTOR) 10 MG tablet, TAKE 1 TABLET AT BEDTIME, Disp: 90 tablet, Rfl: 1 .  TURMERIC CURCUMIN PO, Take 500 mg by mouth daily., Disp: , Rfl:  .  zolpidem (AMBIEN CR) 6.25 MG CR tablet, TAKE 1 TABLET AT BEDTIME AS NEEDED FOR SLEEP (Patient taking differently: as needed. ), Disp: 90 tablet, Rfl: 0  Social History   Tobacco Use  Smoking Status Never Smoker  Smokeless Tobacco Never Used    Allergies  Allergen Reactions  . Wellbutrin [Bupropion]     Muscle twitches  . Clindamycin Hcl Rash   Objective:  There were no vitals filed for this visit. There is no height or weight on file to calculate BMI. Constitutional Well developed. Well nourished.  Vascular Dorsalis pedis pulses palpable bilaterally. Posterior tibial pulses palpable bilaterally. Capillary refill normal to all digits.  No cyanosis or clubbing noted. Pedal hair growth normal.  Neurologic Normal speech. Oriented to person, place, and time. Epicritic sensation to light touch grossly present  bilaterally. Negative Tinel's sign availing sign.  No pain on palpation to the left lateral tarsal tunnel region.  No heel pain no Achilles tendinitis ATFL or posterior tibial pain. Nail Exam: Pt has thick disfigured discolored nails with subungual debris noted bilateral entire nail hallux through fifth toenails  Dermatologic Nails well groomed and normal in appearance. No open wounds. No skin lesions.  Orthopedic:  No pain on palpation to anywhere on the left foot.  Manual muscle testing 5 out of 5.  Mild hammertoe contractures noted bilaterally 2 through 5.  Hallux rigidus noted to bilateral first metatarsophalangeal joint with  right greater than left.    Radiographs: None Assessment:   No diagnosis found. Plan:  Patient was evaluated and treated and all questions answered.  Left foot tarsal tunnel syndrome -Resolved after undergoing gabapentin management and injection.  Onychomycosis with pain with left second digit being worse -Nails palliatively debrided as below. -Educated on self-care -She would like to hold off on Lamisil therapy for now.  She will may start doing Lamisil therapy during fall time however she will let me know prior to beginning the therapy.  She will hold on the medication until then.  Pes planovalgus with associated hallux rigidus -I explained the patient the etiology of hallux rigidus and various treatment options were extensively discussed.  Clinically patient has a little bit of intra-articular pain I believe she will benefit from custom-made orthotics to help address the hallux rigidus component with Morton's extension as well as pes planus deformity.  Patient states understanding would like to proceed with getting orthotics. -She will be scheduled to see [facility] for custom-made orthotics.  Procedure: Nail Debridement Rationale: pain  Type of Debridement: manual, sharp debridement. Instrumentation: Nail nipper, rotary burr. Number of Nails: 10  Procedures and Treatment: Consent by patient was obtained for treatment procedures. The patient understood the discussion of treatment and procedures well. All questions were answered thoroughly reviewed. Debridement of mycotic and hypertrophic toenails, 1 through 5 bilateral and clearing of subungual debris. No ulceration, no infection noted.  Return Visit-Office Procedure: Patient instructed to return to the office for a follow up visit 3 months for continued evaluation and treatment.  Boneta Lucks, DPM    Return in about 1 week (around 07/16/2019) for Sched with Liliane Channel for [employer].

## 2019-07-16 ENCOUNTER — Ambulatory Visit: Payer: Medicare Other | Admitting: Podiatry

## 2019-07-17 ENCOUNTER — Encounter: Payer: Self-pay | Admitting: Rheumatology

## 2019-07-17 ENCOUNTER — Other Ambulatory Visit: Payer: Self-pay

## 2019-07-17 ENCOUNTER — Ambulatory Visit (INDEPENDENT_AMBULATORY_CARE_PROVIDER_SITE_OTHER): Payer: Medicare Other | Admitting: Rheumatology

## 2019-07-17 VITALS — BP 128/79 | HR 79 | Ht 61.0 in | Wt 156.6 lb

## 2019-07-17 DIAGNOSIS — M19071 Primary osteoarthritis, right ankle and foot: Secondary | ICD-10-CM | POA: Diagnosis not present

## 2019-07-17 DIAGNOSIS — G4709 Other insomnia: Secondary | ICD-10-CM

## 2019-07-17 DIAGNOSIS — M19041 Primary osteoarthritis, right hand: Secondary | ICD-10-CM | POA: Diagnosis not present

## 2019-07-17 DIAGNOSIS — M7061 Trochanteric bursitis, right hip: Secondary | ICD-10-CM

## 2019-07-17 DIAGNOSIS — M19072 Primary osteoarthritis, left ankle and foot: Secondary | ICD-10-CM

## 2019-07-17 DIAGNOSIS — F329 Major depressive disorder, single episode, unspecified: Secondary | ICD-10-CM

## 2019-07-17 DIAGNOSIS — M19042 Primary osteoarthritis, left hand: Secondary | ICD-10-CM

## 2019-07-17 DIAGNOSIS — M170 Bilateral primary osteoarthritis of knee: Secondary | ICD-10-CM | POA: Diagnosis not present

## 2019-07-17 DIAGNOSIS — M8589 Other specified disorders of bone density and structure, multiple sites: Secondary | ICD-10-CM

## 2019-07-17 DIAGNOSIS — M7062 Trochanteric bursitis, left hip: Secondary | ICD-10-CM

## 2019-07-17 DIAGNOSIS — E1142 Type 2 diabetes mellitus with diabetic polyneuropathy: Secondary | ICD-10-CM

## 2019-07-17 DIAGNOSIS — Z8639 Personal history of other endocrine, nutritional and metabolic disease: Secondary | ICD-10-CM

## 2019-07-17 DIAGNOSIS — Z8719 Personal history of other diseases of the digestive system: Secondary | ICD-10-CM

## 2019-07-17 DIAGNOSIS — G5793 Unspecified mononeuropathy of bilateral lower limbs: Secondary | ICD-10-CM

## 2019-07-17 DIAGNOSIS — Z85828 Personal history of other malignant neoplasm of skin: Secondary | ICD-10-CM

## 2019-07-17 DIAGNOSIS — M25532 Pain in left wrist: Secondary | ICD-10-CM

## 2019-07-17 DIAGNOSIS — Z86010 Personal history of colonic polyps: Secondary | ICD-10-CM

## 2019-07-17 DIAGNOSIS — F419 Anxiety disorder, unspecified: Secondary | ICD-10-CM

## 2019-07-17 NOTE — Patient Instructions (Signed)
Journal for Nurse Practitioners, 15(4), 263-267. Retrieved November 05, 2017 from [email or web address].[email or web address]">  °Knee Exercises °Ask your health care provider which exercises are safe for you. Do exercises exactly as told by your health care provider and adjust them as directed. It is normal to feel mild stretching, pulling, tightness, or discomfort as you do these exercises. Stop right away if you feel sudden pain or your pain gets worse. Do not begin these exercises until told by your health care provider. °Stretching and range-of-motion exercises °These exercises warm up your muscles and joints and improve the movement and flexibility of your knee. These exercises also help to relieve pain and swelling. °Knee extension, prone °1. Lie on your abdomen (prone position) on a bed. °2. Place your left / right knee just beyond the edge of the surface so your knee is not on the bed. You can put a towel under your left / right thigh just above your kneecap for comfort. °3. Relax your leg muscles and allow gravity to straighten your knee (extension). You should feel a stretch behind your left / right knee. °4. Hold this position for __________ seconds. °5. Scoot up so your knee is supported between repetitions. °Repeat __________ times. Complete this exercise __________ times a day. °Knee flexion, active ° °1. Lie on your back with both legs straight. If this causes back discomfort, bend your left / right knee so your foot is flat on the floor. °2. Slowly slide your left / right heel back toward your buttocks. Stop when you feel a gentle stretch in the front of your knee or thigh (flexion). °3. Hold this position for __________ seconds. °4. Slowly slide your left / right heel back to the starting position. °Repeat __________ times. Complete this exercise __________ times a day. °Quadriceps stretch, prone ° °1. Lie on your abdomen on a firm surface, such as a bed or padded floor. °2. Bend your left / right knee and hold  your ankle. If you cannot reach your ankle or pant leg, loop a belt around your foot and grab the belt instead. °3. Gently pull your heel toward your buttocks. Your knee should not slide out to the side. You should feel a stretch in the front of your thigh and knee (quadriceps). °4. Hold this position for __________ seconds. °Repeat __________ times. Complete this exercise __________ times a day. °Hamstring, supine °1. Lie on your back (supine position). °2. Loop a belt or towel over the ball of your left / right foot. The ball of your foot is on the walking surface, right under your toes. °3. Straighten your left / right knee and slowly pull on the belt to raise your leg until you feel a gentle stretch behind your knee (hamstring). °? Do not let your knee bend while you do this. °? Keep your other leg flat on the floor. °4. Hold this position for __________ seconds. °Repeat __________ times. Complete this exercise __________ times a day. °Strengthening exercises °These exercises build strength and endurance in your knee. Endurance is the ability to use your muscles for a long time, even after they get tired. °Quadriceps, isometric °This exercise stretches the muscles in front of your thigh (quadriceps) without moving your knee joint (isometric). °1. Lie on your back with your left / right leg extended and your other knee bent. Put a rolled towel or small pillow under your knee if told by your health care provider. °2. Slowly tense the muscles in the front of your left /   right thigh. You should see your kneecap slide up toward your hip or see increased dimpling just above the knee. This motion will push the back of the knee toward the floor. °3. For __________ seconds, hold the muscle as tight as you can without increasing your pain. °4. Relax the muscles slowly and completely. °Repeat __________ times. Complete this exercise __________ times a day. °Straight leg raises °This exercise stretches the muscles in front  of your thigh (quadriceps) and the muscles that move your hips (hip flexors). °1. Lie on your back with your left / right leg extended and your other knee bent. °2. Tense the muscles in the front of your left / right thigh. You should see your kneecap slide up or see increased dimpling just above the knee. Your thigh may even shake a bit. °3. Keep these muscles tight as you raise your leg 4-6 inches (10-15 cm) off the floor. Do not let your knee bend. °4. Hold this position for __________ seconds. °5. Keep these muscles tense as you lower your leg. °6. Relax your muscles slowly and completely after each repetition. °Repeat __________ times. Complete this exercise __________ times a day. °Hamstring, isometric °1. Lie on your back on a firm surface. °2. Bend your left / right knee about __________ degrees. °3. Dig your left / right heel into the surface as if you are trying to pull it toward your buttocks. Tighten the muscles in the back of your thighs (hamstring) to "dig" as hard as you can without increasing any pain. °4. Hold this position for __________ seconds. °5. Release the tension gradually and allow your muscles to relax completely for __________ seconds after each repetition. °Repeat __________ times. Complete this exercise __________ times a day. °Hamstring curls °If told by your health care provider, do this exercise while wearing ankle weights. Begin with __________ lb weights. Then increase the weight by 1 lb (0.5 kg) increments. Do not wear ankle weights that are more than __________ lb. °1. Lie on your abdomen with your legs straight. °2. Bend your left / right knee as far as you can without feeling pain. Keep your hips flat against the floor. °3. Hold this position for __________ seconds. °4. Slowly lower your leg to the starting position. °Repeat __________ times. Complete this exercise __________ times a day. °Squats °This exercise strengthens the muscles in front of your thigh and knee  (quadriceps). °1. Stand in front of a table, with your feet and knees pointing straight ahead. You may rest your hands on the table for balance but not for support. °2. Slowly bend your knees and lower your hips like you are going to sit in a chair. °? Keep your weight over your heels, not over your toes. °? Keep your lower legs upright so they are parallel with the table legs. °? Do not let your hips go lower than your knees. °? Do not bend lower than told by your health care provider. °? If your knee pain increases, do not bend as low. °3. Hold the squat position for __________ seconds. °4. Slowly push with your legs to return to standing. Do not use your hands to pull yourself to standing. °Repeat __________ times. Complete this exercise __________ times a day. °Wall slides °This exercise strengthens the muscles in front of your thigh and knee (quadriceps). °1. Lean your back against a smooth wall or door, and walk your feet out 18-24 inches (46-61 cm) from it. °2. Place your feet hip-width apart. °3.   Slowly slide down the wall or door until your knees bend __________ degrees. Keep your knees over your heels, not over your toes. Keep your knees in line with your hips. 4. Hold this position for __________ seconds. Repeat __________ times. Complete this exercise __________ times a day. Straight leg raises This exercise strengthens the muscles that rotate the leg at the hip and move it away from your body (hip abductors). 1. Lie on your side with your left / right leg in the top position. Lie so your head, shoulder, knee, and hip line up. You may bend your bottom knee to help you keep your balance. 2. Roll your hips slightly forward so your hips are stacked directly over each other and your left / right knee is facing forward. 3. Leading with your heel, lift your top leg 4-6 inches (10-15 cm). You should feel the muscles in your outer hip lifting. ? Do not let your foot drift forward. ? Do not let your knee  roll toward the ceiling. 4. Hold this position for __________ seconds. 5. Slowly return your leg to the starting position. 6. Let your muscles relax completely after each repetition. Repeat __________ times. Complete this exercise __________ times a day. Straight leg raises This exercise stretches the muscles that move your hips away from the front of the pelvis (hip extensors). 1. Lie on your abdomen on a firm surface. You can put a pillow under your hips if that is more comfortable. 2. Tense the muscles in your buttocks and lift your left / right leg about 4-6 inches (10-15 cm). Keep your knee straight as you lift your leg. 3. Hold this position for __________ seconds. 4. Slowly lower your leg to the starting position. 5. Let your leg relax completely after each repetition. Repeat __________ times. Complete this exercise __________ times a day. This information is not intended to replace advice given to you by your health care provider. Make sure you discuss any questions you have with your health care provider. Document Revised: 11/06/2017 Document Reviewed: 11/06/2017 Elsevier Patient Education  [place] [facility] Ask your health care provider which exercises are safe for you. Do exercises exactly as told by your health care provider and adjust them as directed. It is normal to feel mild stretching, pulling, tightness, or discomfort as you do these exercises. Stop right away if you feel sudden pain or your pain gets significantly worse. Do not begin these exercises until told by your health care provider. Stretching and range-of-motion exercises These exercises warm up your muscles and joints and improve the movement and flexibility of your hip and pelvis. Quadriceps stretch, prone  6. Lie on your abdomen on a firm surface, such as a bed or padded floor (prone position). 7. Bend your left / right knee and reach back to hold your ankle or pant leg. If you  cannot reach your ankle or pant leg, loop a belt around your foot and grab the belt instead. 8. Gently pull your heel toward your buttocks. Your knee should not slide out to the side. You should feel a stretch in the front of your thigh and knee (quadriceps). 9. Hold this position for __________ seconds. Repeat __________ times. Complete this exercise __________ times a day. Iliotibial band stretch An iliotibial band is a strong band of muscle tissue that runs from the outer side of your hip to the outer side of your thigh and knee. 5. Lie on your side with your left /  right leg in the top position. 6. Bend both of your knees and grab your left / right ankle. Stretch out your bottom arm to help you balance. 7. Slowly bring your top knee back so your thigh goes behind your trunk. 8. Slowly lower your top leg toward the floor until you feel a gentle stretch on the outside of your left / right hip and thigh. If you do not feel a stretch and your knee will not fall farther, place the heel of your other foot on top of your knee and pull your knee down toward the floor with your foot. 9. Hold this position for __________ seconds. Repeat __________ times. Complete this exercise __________ times a day. Strengthening exercises These exercises build strength and endurance in your hip and pelvis. Endurance is the ability to use your muscles for a long time, even after they get tired. Straight leg raises, side-lying This exercise strengthens the muscles that rotate the leg at the hip and move it away from your body (hip abductors). 5. Lie on your side with your left / right leg in the top position. Lie so your head, shoulder, hip, and knee line up. You may bend your bottom knee to help you balance. 6. Roll your hips slightly forward so your hips are stacked directly over each other and your left / right knee is facing forward. 7. Tense the muscles in your outer thigh and lift your top leg 4-6 inches (10-15  cm). 8. Hold this position for __________ seconds. 9. Slowly return to the starting position. Let your muscles relax completely before doing another repetition. Repeat __________ times. Complete this exercise __________ times a day. Leg raises, prone This exercise strengthens the muscles that move the hips (hip extensors). 5. Lie on your abdomen on your bed or a firm surface. You can put a pillow under your hips if that is more comfortable for your lower back. 6. Bend your left / right knee so your foot is straight up in the air. 7. Squeeze your buttocks muscles and lift your left / right thigh off the bed. Do not let your back arch. 8. Tense your thigh muscle as hard as you can without increasing any knee pain. 9. Hold this position for __________ seconds. 10. Slowly lower your leg to the starting position and allow it to relax completely. Repeat __________ times. Complete this exercise __________ times a day. Hip hike 5. Stand sideways on a bottom step. Stand on your left / right leg with your other foot unsupported next to the step. You can hold on to the railing or wall for balance if needed. 6. Keep your knees straight and your torso square. Then lift your left / right hip up toward the ceiling. 7. Slowly let your left / right hip lower toward the floor, past the starting position. Your foot should get closer to the floor. Do not lean or bend your knees. Repeat __________ times. Complete this exercise __________ times a day. This information is not intended to replace advice given to you by your health care provider. Make sure you discuss any questions you have with your health care provider. Document Revised: 05/09/2018 Document Reviewed: 11/07/2017 Elsevier Patient Education  [place]. Hand Exercises Hand exercises can be helpful for almost anyone. These exercises can strengthen the hands, improve flexibility and movement, and increase blood flow to the hands. These results can  make work and daily tasks easier. Hand exercises can be especially helpful for people  who have joint pain from arthritis or have nerve damage from overuse (carpal tunnel syndrome). These exercises can also help people who have injured a hand. Exercises Most of these hand exercises are gentle stretching and motion exercises. It is usually safe to do them often throughout the day. Warming up your hands before exercise may help to reduce stiffness. You can do this with gentle massage or by placing your hands in warm water for 10-15 minutes. It is normal to feel some stretching, pulling, tightness, or mild discomfort as you begin new exercises. This will gradually improve. Stop an exercise right away if you feel sudden, severe pain or your pain gets worse. Ask your health care provider which exercises are best for you. Knuckle bend or "claw" fist 1. Stand or sit with your arm, hand, and all five fingers pointed straight up. Make sure to keep your wrist straight during the exercise. 2. Gently bend your fingers down toward your palm until the tips of your fingers are touching the top of your palm. Keep your big knuckle straight and just bend the small knuckles in your fingers. 3. Hold this position for __________ seconds. 4. Straighten (extend) your fingers back to the starting position. Repeat this exercise 5-10 times with each hand. Full finger fist 1. Stand or sit with your arm, hand, and all five fingers pointed straight up. Make sure to keep your wrist straight during the exercise. 2. Gently bend your fingers into your palm until the tips of your fingers are touching the middle of your palm. 3. Hold this position for __________ seconds. 4. Extend your fingers back to the starting position, stretching every joint fully. Repeat this exercise 5-10 times with each hand. Straight fist 1. Stand or sit with your arm, hand, and all five fingers pointed straight up. Make sure to keep your wrist straight  during the exercise. 2. Gently bend your fingers at the big knuckle, where your fingers meet your hand, and the middle knuckle. Keep the knuckle at the tips of your fingers straight and try to touch the bottom of your palm. 3. Hold this position for __________ seconds. 4. Extend your fingers back to the starting position, stretching every joint fully. Repeat this exercise 5-10 times with each hand. Tabletop 1. Stand or sit with your arm, hand, and all five fingers pointed straight up. Make sure to keep your wrist straight during the exercise. 2. Gently bend your fingers at the big knuckle, where your fingers meet your hand, as far down as you can while keeping the small knuckles in your fingers straight. Think of forming a tabletop with your fingers. 3. Hold this position for __________ seconds. 4. Extend your fingers back to the starting position, stretching every joint fully. Repeat this exercise 5-10 times with each hand. Finger spread 1. Place your hand flat on a table with your palm facing down. Make sure your wrist stays straight as you do this exercise. 2. Spread your fingers and thumb apart from each other as far as you can until you feel a gentle stretch. Hold this position for __________ seconds. 3. Bring your fingers and thumb tight together again. Hold this position for __________ seconds. Repeat this exercise 5-10 times with each hand. Making circles 1. Stand or sit with your arm, hand, and all five fingers pointed straight up. Make sure to keep your wrist straight during the exercise. 2. Make a circle by touching the tip of your thumb to the tip of your index  finger. 3. Hold for __________ seconds. Then open your hand wide. 4. Repeat this motion with your thumb and each finger on your hand. Repeat this exercise 5-10 times with each hand. Thumb motion 1. Sit with your forearm resting on a table and your wrist straight. Your thumb should be facing up toward the ceiling. Keep your  fingers relaxed as you move your thumb. 2. Lift your thumb up as high as you can toward the ceiling. Hold for __________ seconds. 3. Bend your thumb across your palm as far as you can, reaching the tip of your thumb for the small finger (pinkie) side of your palm. Hold for __________ seconds. Repeat this exercise 5-10 times with each hand. Grip strengthening  1. Hold a stress ball or other soft ball in the middle of your hand. 2. Slowly increase the pressure, squeezing the ball as much as you can without causing pain. Think of bringing the tips of your fingers into the middle of your palm. All of your finger joints should bend when doing this exercise. 3. Hold your squeeze for __________ seconds, then relax. Repeat this exercise 5-10 times with each hand. Contact a health care provider if:  Your hand pain or discomfort gets much worse when you do an exercise.  Your hand pain or discomfort does not improve within 2 hours after you exercise. If you have any of these problems, stop doing these exercises right away. Do not do them again unless your health care provider says that you can. Get help right away if:  You develop sudden, severe hand pain or swelling. If this happens, stop doing these exercises right away. Do not do them again unless your health care provider says that you can. This information is not intended to replace advice given to you by your health care provider. Make sure you discuss any questions you have with your health care provider. Document Revised: 05/09/2018 Document Reviewed: 01/17/2018 Elsevier Patient Education  [place].

## 2019-08-05 ENCOUNTER — Telehealth: Payer: Self-pay | Admitting: Rheumatology

## 2019-08-05 NOTE — Telephone Encounter (Signed)
CD at front desk, I called patient.

## 2019-08-05 NOTE — Telephone Encounter (Signed)
Patient called stating Dr. Estanislado Pandy referred her to the [facility].  Patient states she is scheduled to see Dr. Burney Gauze on Thursday, 08/07/19 and the letter she received from their office is requesting she bring a copy of her x-rays.  Patient is requesting a return call to let her know when she can stop by the office to pick them up.

## 2019-08-07 ENCOUNTER — Other Ambulatory Visit: Payer: Self-pay

## 2019-08-07 ENCOUNTER — Ambulatory Visit: Payer: Medicare Other | Admitting: Orthotics

## 2019-08-07 DIAGNOSIS — G5752 Tarsal tunnel syndrome, left lower limb: Secondary | ICD-10-CM

## 2019-08-07 DIAGNOSIS — M79674 Pain in right toe(s): Secondary | ICD-10-CM

## 2019-08-07 DIAGNOSIS — E1142 Type 2 diabetes mellitus with diabetic polyneuropathy: Secondary | ICD-10-CM

## 2019-08-07 NOTE — Progress Notes (Signed)
Patient came in today to pick up custom made foot orthotics.  The goals were accomplished and the patient reported no dissatisfaction with said orthotics.  Patient was advised of breakin period and how to report any issues. 

## 2019-08-11 ENCOUNTER — Encounter: Payer: Self-pay | Admitting: Podiatry

## 2019-08-18 ENCOUNTER — Other Ambulatory Visit: Payer: Self-pay | Admitting: Podiatry

## 2019-09-10 ENCOUNTER — Telehealth: Payer: Self-pay | Admitting: Family Medicine

## 2019-09-10 NOTE — Telephone Encounter (Signed)
Just returned to you - wanted to see if she had tried other meds. They do not cover nexium. They cover omeprazole or pantoprazole. Suggest pantoprazole 40mg  if she hasn't tried before. Noted as well that they have incorrect office address on their form for me.

## 2019-09-10 NOTE — Telephone Encounter (Signed)
Spoke with the pt and informed her of the message below.  Patient agreed to begin Pantoprazole 40mg  daily and is aware the form was completed and faxed [facility] at [phone] with this info.

## 2019-09-10 NOTE — Telephone Encounter (Signed)
Santiago Glad from [facility] called to see if a fax was received for the following pt.  Drug change authorization form  Reference # [id number]  Contact number [phone]

## 2019-09-17 ENCOUNTER — Ambulatory Visit: Payer: Medicare Other | Admitting: Podiatry

## 2019-09-18 ENCOUNTER — Other Ambulatory Visit: Payer: Self-pay | Admitting: Family Medicine

## 2019-09-18 ENCOUNTER — Encounter: Payer: Self-pay | Admitting: Podiatry

## 2019-09-19 ENCOUNTER — Other Ambulatory Visit: Payer: Self-pay

## 2019-09-19 ENCOUNTER — Ambulatory Visit (INDEPENDENT_AMBULATORY_CARE_PROVIDER_SITE_OTHER): Payer: Medicare Other | Admitting: Family Medicine

## 2019-09-19 ENCOUNTER — Encounter: Payer: Self-pay | Admitting: Family Medicine

## 2019-09-19 VITALS — BP 102/70 | HR 64 | Temp 98.2°F | Wt 157.2 lb

## 2019-09-19 DIAGNOSIS — R739 Hyperglycemia, unspecified: Secondary | ICD-10-CM

## 2019-09-19 DIAGNOSIS — F419 Anxiety disorder, unspecified: Secondary | ICD-10-CM | POA: Diagnosis not present

## 2019-09-19 DIAGNOSIS — Z1231 Encounter for screening mammogram for malignant neoplasm of breast: Secondary | ICD-10-CM

## 2019-09-19 DIAGNOSIS — E1142 Type 2 diabetes mellitus with diabetic polyneuropathy: Secondary | ICD-10-CM | POA: Diagnosis not present

## 2019-09-19 DIAGNOSIS — E1169 Type 2 diabetes mellitus with other specified complication: Secondary | ICD-10-CM | POA: Diagnosis not present

## 2019-09-19 DIAGNOSIS — L989 Disorder of the skin and subcutaneous tissue, unspecified: Secondary | ICD-10-CM

## 2019-09-19 DIAGNOSIS — G4709 Other insomnia: Secondary | ICD-10-CM

## 2019-09-19 DIAGNOSIS — E785 Hyperlipidemia, unspecified: Secondary | ICD-10-CM

## 2019-09-19 LAB — CBC WITH DIFFERENTIAL/PLATELET
Absolute Monocytes: 568 cells/uL (ref 200–950)
Basophils Absolute: 32 cells/uL (ref 0–200)
Basophils Relative: 0.4 %
Eosinophils Absolute: 96 cells/uL (ref 15–500)
Eosinophils Relative: 1.2 %
HCT: 42.7 % (ref 35.0–45.0)
Hemoglobin: 14.1 g/dL (ref 11.7–15.5)
Lymphs Abs: 1944 cells/uL (ref 850–3900)
MCH: 29.5 pg (ref 27.0–33.0)
MCHC: 33 g/dL (ref 32.0–36.0)
MCV: 89.3 fL (ref 80.0–100.0)
MPV: 10.4 fL (ref 7.5–12.5)
Monocytes Relative: 7.1 %
Neutro Abs: 5360 cells/uL (ref 1500–7800)
Neutrophils Relative %: 67 %
Platelets: 236 10*3/uL (ref 140–400)
RBC: 4.78 10*6/uL (ref 3.80–5.10)
RDW: 12.5 % (ref 11.0–15.0)
Total Lymphocyte: 24.3 %
WBC: 8 10*3/uL (ref 3.8–10.8)

## 2019-09-19 LAB — COMPREHENSIVE METABOLIC PANEL
AG Ratio: 2 (calc) (ref 1.0–2.5)
ALT: 11 U/L (ref 6–29)
AST: 17 U/L (ref 10–35)
Albumin: 4.3 g/dL (ref 3.6–5.1)
Alkaline phosphatase (APISO): 75 U/L (ref 37–153)
BUN: 13 mg/dL (ref 7–25)
CO2: 28 mmol/L (ref 20–32)
Calcium: 9.4 mg/dL (ref 8.6–10.4)
Chloride: 104 mmol/L (ref 98–110)
Creat: 0.91 mg/dL (ref 0.50–0.99)
Globulin: 2.1 g/dL (calc) (ref 1.9–3.7)
Glucose, Bld: 77 mg/dL (ref 65–99)
Potassium: 4 mmol/L (ref 3.5–5.3)
Sodium: 139 mmol/L (ref 135–146)
Total Bilirubin: 0.5 mg/dL (ref 0.2–1.2)
Total Protein: 6.4 g/dL (ref 6.1–8.1)

## 2019-09-19 LAB — LIPID PANEL
Cholesterol: 147 mg/dL (ref <200)
HDL: 74 mg/dL (ref >=50)
LDL Cholesterol (Calc): 57 mg/dL (calc)
Non-HDL Cholesterol (Calc): 73 mg/dL (calc) (ref <130)
Total CHOL/HDL Ratio: 2 (calc) (ref <5.0)
Triglycerides: 76 mg/dL (ref <150)

## 2019-09-19 LAB — POCT GLYCOSYLATED HEMOGLOBIN (HGB A1C)
HbA1c, POC (controlled diabetic range): 5.5 % (ref 0.0–7.0)
Hemoglobin A1C: 5.5 % (ref 4.0–5.6)

## 2019-09-19 MED ORDER — PANTOPRAZOLE SODIUM 40 MG PO TBEC: 40.0000 mg | DELAYED_RELEASE_TABLET | Freq: Every day | ORAL | 3 refills | Status: DC

## 2019-09-19 MED ORDER — ALPRAZOLAM 1 MG PO TABS: 1.0000 mg | ORAL_TABLET | ORAL | 2 refills | Status: DC | PRN

## 2019-09-19 NOTE — Progress Notes (Signed)
[place] DOB: [birth year] Encounter date: 09/19/2019  This is a 69 y.o. female who presents with Chief Complaint  Patient presents with   Diabetes    History of present illness: nexium no longer covered; alprazolam also not covered. We discussed protonix in place of nexium and discussed cash pay for alprazolam.   Noted discoloration on hand. Hurts. Not sure when it started. Last week was on vacation and when she came back noted this. Felt rough to touch. Feels like discoloration has been about the same in size. No swelling. No fevers, no chills. Not sure if bite there or what started it.   Having pain left abdomen. Not there all the time. Feels like bruise in belly. Sometimes notes with exercise or if she rolls the wrong way. No association with eating or bowel movements.   Would like ears checked.   Due for mammogram: she thought she was up to date.   Last visit was in May/2021 for follow-up after starting Lexapro. She is worried with winter coming up and November is a year since mom passing and anniversary of dad's death.   Diabetes: Diet controlled.  HPI   Allergies  Allergen Reactions   Wellbutrin [Bupropion]     Muscle twitches   Clindamycin Hcl Rash   Current Meds  Medication Sig   ALPRAZolam (XANAX) 1 MG tablet Take 1 tablet (1 mg total) by mouth as needed for anxiety.   CALCIUM CITRATE PO Take 600 mg by mouth daily.   Cholecalciferol (VITAMIN D3 PO) Take 2,000 Units by mouth daily.   escitalopram (LEXAPRO) 10 MG tablet Take 1 tablet (10 mg total) by mouth daily.   fluticasone (FLONASE) 50 MCG/ACT nasal spray    gabapentin (NEURONTIN) 100 MG capsule TAKE 1 CAPSULE THREE TIMES A DAY   Glucosamine-Chondroitin (GLUCOSAMINE CHONDR COMPLEX PO) Take by mouth.   meloxicam (MOBIC) 7.5 MG tablet Take 1 tablet (7.5 mg total) by mouth 2 (two) times daily as needed for pain.   METRONIDAZOLE, TOPICAL, 0.75 % LOTN as needed.    rosuvastatin (CRESTOR) 10 MG  tablet TAKE 1 TABLET AT BEDTIME   TURMERIC CURCUMIN PO Take 500 mg by mouth daily.   [DISCONTINUED] ALPRAZolam (XANAX) 1 MG tablet Take 1 tablet (1 mg total) by mouth daily as needed for anxiety. (Patient taking differently: Take 1 mg by mouth as needed for anxiety. )   [DISCONTINUED] esomeprazole (NEXIUM) 40 MG capsule Take 1 capsule (40 mg total) by mouth daily.    Review of Systems  Constitutional: Negative for chills, fatigue and fever.  Respiratory: Negative for cough, chest tightness, shortness of breath and wheezing.   Cardiovascular: Negative for chest pain, palpitations and leg swelling.  Gastrointestinal: Positive for abdominal pain. Negative for constipation, diarrhea, nausea and vomiting.  Skin: Positive for color change.    Objective:  BP 102/70 (BP Location: Right Arm, Patient Position: Sitting, Cuff Size: Large)    Pulse 64    Temp 98.2 F (36.8 C) (Oral)    Wt 157 lb 3.2 oz (71.3 kg)    SpO2 95%    BMI 29.70 kg/m   Weight: 157 lb 3.2 oz (71.3 kg)   BP Readings from Last 3 Encounters:  09/19/19 102/70  07/17/19 128/79  06/11/19 120/82   Wt Readings from Last 3 Encounters:  09/19/19 157 lb 3.2 oz (71.3 kg)  07/17/19 156 lb 9.6 oz (71 kg)  06/25/19 158 lb (71.7 kg)    Physical Exam Constitutional:  General: She is not in acute distress.    Appearance: She is well-developed.  HENT:     Right Ear: Tympanic membrane, ear canal and external ear normal.     Left Ear: Tympanic membrane, ear canal and external ear normal.  Cardiovascular:     Rate and Rhythm: Normal rate and regular rhythm.     Heart sounds: Normal heart sounds. No murmur heard.  No friction rub.  Pulmonary:     Effort: Pulmonary effort is normal. No respiratory distress.     Breath sounds: Normal breath sounds. No wheezing or rales.  Musculoskeletal:     Right lower leg: No edema.     Left lower leg: No edema.  Skin:    Comments: Dorsum right hand there is small 2 mm scab with  surrounding area of hypopigmentation that is slightly pink.  Mild tenderness with palpation of this area.  Neurological:     Mental Status: She is alert and oriented to person, place, and time.  Psychiatric:        Behavior: Behavior normal.     Assessment/Plan  1. Controlled type 2 diabetes mellitus with diabetic polyneuropathy, without long-term current use of insulin (HCC) Diet controlled.  Continue to watch diet and work on regular exercise. - POCT glycosylated hemoglobin (Hb A1C)  2. Anxiety Has been well controlled with Lexapro.  She is worried about fall months that are coming up, since she will be celebrating anniversary of loss of parents.  We discussed potentially increasing her dose of medication to help her through this time.  3. Other insomnia She does very well with the alprazolam as needed.  Insurance is not covering this any longer, but we discussed cash paying for this.  4. Hyperlipidemia associated with type 2 diabetes mellitus ([place]) Has been well controlled with Crestor.  Continue this medication.  5. Encounter for screening mammogram for malignant neoplasm of breast - MM DIGITAL SCREENING BILATERAL; Future  8. Skin lesion Uncertain what skin lesion is.  It appears that she had some sort of bite or irritation centrally and that surrounding skin reacted to this.  I have asked her to monitor and let me know if any worsening or enlargement.  I suspect that pigment will return and that this is just an irritation/reaction.   Return in about 6 months (around 03/21/2020) for physical exam.    Micheline Rough, MD

## 2019-09-24 ENCOUNTER — Ambulatory Visit: Payer: Medicare Other | Admitting: Orthotics

## 2019-09-24 ENCOUNTER — Telehealth: Payer: Self-pay | Admitting: Family Medicine

## 2019-09-24 ENCOUNTER — Other Ambulatory Visit: Payer: Self-pay

## 2019-09-24 DIAGNOSIS — M79671 Pain in right foot: Secondary | ICD-10-CM

## 2019-09-24 DIAGNOSIS — G5752 Tarsal tunnel syndrome, left lower limb: Secondary | ICD-10-CM

## 2019-09-24 DIAGNOSIS — M79672 Pain in left foot: Secondary | ICD-10-CM

## 2019-09-24 DIAGNOSIS — E1142 Type 2 diabetes mellitus with diabetic polyneuropathy: Secondary | ICD-10-CM

## 2019-09-24 NOTE — Telephone Encounter (Signed)
Renee Bauer was informed of the message below.

## 2019-09-24 NOTE — Telephone Encounter (Signed)
Spoke with Renee Bauer, pharmacist at [facility] and he stated he needs the frequency for Alprazolam that was given on 8/20.  Message sent to PCP.

## 2019-09-24 NOTE — Telephone Encounter (Signed)
[place] call and want to know often should pt take the medication that was sent in.

## 2019-09-24 NOTE — Telephone Encounter (Signed)
By mouth daily prn anxiety

## 2019-09-24 NOTE — Progress Notes (Signed)
Moving met pad 1" more distal

## 2019-10-01 ENCOUNTER — Other Ambulatory Visit: Payer: Self-pay

## 2019-10-01 ENCOUNTER — Ambulatory Visit
Admission: RE | Admit: 2019-10-01 | Discharge: 2019-10-01 | Disposition: A | Discharge status: Home / Self Care | Payer: Medicare Other | Source: Ambulatory Visit | Attending: Family Medicine | Admitting: Family Medicine

## 2019-10-01 DIAGNOSIS — Z1231 Encounter for screening mammogram for malignant neoplasm of breast: Secondary | ICD-10-CM

## 2019-10-09 ENCOUNTER — Encounter: Payer: Self-pay | Admitting: Family Medicine

## 2019-10-10 ENCOUNTER — Other Ambulatory Visit: Payer: Self-pay | Admitting: Family Medicine

## 2019-10-10 DIAGNOSIS — R928 Other abnormal and inconclusive findings on diagnostic imaging of breast: Secondary | ICD-10-CM

## 2019-10-13 ENCOUNTER — Ambulatory Visit
Admission: RE | Admit: 2019-10-13 | Discharge: 2019-10-13 | Disposition: A | Discharge status: Home / Self Care | Payer: Medicare Other | Source: Ambulatory Visit | Attending: Family Medicine | Admitting: Family Medicine

## 2019-10-13 ENCOUNTER — Other Ambulatory Visit: Payer: Self-pay

## 2019-10-13 DIAGNOSIS — R928 Other abnormal and inconclusive findings on diagnostic imaging of breast: Secondary | ICD-10-CM

## 2019-10-14 ENCOUNTER — Encounter: Payer: Self-pay | Admitting: Family Medicine

## 2019-10-14 NOTE — Telephone Encounter (Signed)
Spoke with the pt and scheduled an appt for 9/15 with Dr Elease Hashimoto as PCP is out of the office.

## 2019-10-15 ENCOUNTER — Encounter: Payer: Self-pay | Admitting: Family Medicine

## 2019-10-15 ENCOUNTER — Ambulatory Visit (INDEPENDENT_AMBULATORY_CARE_PROVIDER_SITE_OTHER): Payer: Medicare Other | Admitting: Family Medicine

## 2019-10-15 ENCOUNTER — Other Ambulatory Visit: Payer: Self-pay

## 2019-10-15 VITALS — BP 110/70 | HR 87 | Temp 98.1°F | Ht 61.0 in | Wt 160.0 lb

## 2019-10-15 DIAGNOSIS — L739 Follicular disorder, unspecified: Secondary | ICD-10-CM | POA: Diagnosis not present

## 2019-10-15 MED ORDER — DOXYCYCLINE HYCLATE 100 MG PO CAPS: 100.0000 mg | ORAL_CAPSULE | Freq: Two times a day (BID) | ORAL | 0 refills | Status: DC

## 2019-10-15 NOTE — Patient Instructions (Signed)

## 2019-10-15 NOTE — Progress Notes (Signed)
Established Patient Office Visit  Subjective:  Patient ID: Renee Bauer, female    DOB: 11/17/1950  Age: 69 y.o. MRN: [id number]  CC:  Chief Complaint  Patient presents with  . Rash    3 day sx rash under breast no pain irritated    HPI [facility] presents for rash under both breasts for about 3 days.  Slightly sore.  No itching.  She noticed some small papules and pustules.  She tried topical antibiotic with either Polysporin or Neosporin without much improvement.  No generalized rash.  No fevers or chills.  She uses some type of soap from [place].  No recent change of soap.  She is allergic to clindamycin.  Past Medical History:  Diagnosis Date  . Anxiety   . Arthritis   . Benign colon polyp 2018  . Bursitis   . Depression   . Diabetes mellitus without complication ([place])   . GERD (gastroesophageal reflux disease)   . Hyperlipidemia   . Rosacea     Past Surgical History:  Procedure Laterality Date  . ABDOMINAL HYSTERECTOMY     menorrhagia  . BLADDER REPAIR    . BREAST BIOPSY Left   . BUNIONECTOMY Right   . COLON RESECTION  2007  . DILATION AND CURETTAGE OF UTERUS      Family History  Problem Relation Age of Onset  . Diabetes Mother   . Arrhythmia Mother   . High blood pressure Mother   . Mesothelioma Father        asbestos exposure  . Healthy Sister   . Brain cancer Maternal Aunt   . Post-traumatic stress disorder Son   . Scoliosis Son     Social History   Socioeconomic History  . Marital status: Married    Spouse name: Not on file  . Number of children: Not on file  . Years of education: Not on file  . Highest education level: Not on file  Occupational History  . Not on file  Tobacco Use  . Smoking status: Never Smoker  . Smokeless tobacco: Never Used  Vaping Use  . Vaping Use: Never used  Substance and Sexual Activity  . Alcohol use: Yes    Comment: occasional wine  . Drug use: Never  . Sexual activity: Not on file    Other Topics Concern  . Not on file  Social History Narrative  . Not on file   Social Determinants of Health   Financial Resource Strain:   . Difficulty of Paying Living Expenses: Not on file  Food Insecurity:   . Worried About Charity fundraiser in the Last Year: Not on file  . Ran Out of Food in the Last Year: Not on file  Transportation Needs:   . Lack of Transportation (Medical): Not on file  . Lack of Transportation (Non-Medical): Not on file  Physical Activity:   . Days of Exercise per Week: Not on file  . Minutes of Exercise per Session: Not on file  Stress:   . Feeling of Stress : Not on file  Social Connections:   . Frequency of Communication with Friends and Family: Not on file  . Frequency of Social Gatherings with Friends and Family: Not on file  . Attends Religious Services: Not on file  . Active Member of Clubs or Organizations: Not on file  . Attends Archivist Meetings: Not on file  . Marital Status: Not on file  Intimate Partner  Violence:   . Fear of Current or Ex-Partner: Not on file  . Emotionally Abused: Not on file  . Physically Abused: Not on file  . Sexually Abused: Not on file    Outpatient Medications Prior to Visit  Medication Sig Dispense Refill  . ALPRAZolam (XANAX) 1 MG tablet Take 1 tablet (1 mg total) by mouth as needed for anxiety. 30 tablet 2  . CALCIUM CITRATE PO Take 600 mg by mouth daily.    . Cholecalciferol (VITAMIN D3 PO) Take 2,000 Units by mouth daily.    Marland Kitchen escitalopram (LEXAPRO) 10 MG tablet Take 1 tablet (10 mg total) by mouth daily. 90 tablet 3  . fluticasone (FLONASE) 50 MCG/ACT nasal spray     . gabapentin (NEURONTIN) 100 MG capsule TAKE 1 CAPSULE THREE TIMES A DAY 90 capsule 11  . Glucosamine-Chondroitin (GLUCOSAMINE CHONDR COMPLEX PO) Take by mouth.    . meloxicam (MOBIC) 7.5 MG tablet Take 1 tablet (7.5 mg total) by mouth 2 (two) times daily as needed for pain. 180 tablet 0  . METRONIDAZOLE, TOPICAL, 0.75 % LOTN  as needed.     . pantoprazole (PROTONIX) 40 MG tablet Take 1 tablet (40 mg total) by mouth daily. 90 tablet 3  . rosuvastatin (CRESTOR) 10 MG tablet TAKE 1 TABLET AT BEDTIME 90 tablet 1  . TURMERIC CURCUMIN PO Take 500 mg by mouth daily.     No facility-administered medications prior to visit.    Allergies  Allergen Reactions  . Wellbutrin [Bupropion]     Muscle twitches  . Clindamycin Hcl Rash    ROS Review of Systems  Constitutional: Negative for chills and fever.  Skin: Positive for rash.      Objective:    Physical Exam Vitals reviewed.  Constitutional:      Appearance: Normal appearance.  Cardiovascular:     Rate and Rhythm: Normal rate and regular rhythm.  Skin:    Comments: She has some scattered small papules several/most with pustular center under both breast.  No scaling.  No diffuse rash  Neurological:     Mental Status: She is alert.     BP 110/70   Pulse 87   Temp 98.1 F (36.7 C) (Oral)   Ht 5\' 1"  (1.549 m)   Wt 160 lb (72.6 kg)   SpO2 98%   BMI 30.23 kg/m  Wt Readings from Last 3 Encounters:  10/15/19 160 lb (72.6 kg)  09/19/19 157 lb 3.2 oz (71.3 kg)  07/17/19 156 lb 9.6 oz (71 kg)     Health Maintenance Due  Topic Date Due  . FOOT EXAM  Never done  . URINE MICROALBUMIN  11/04/2019    There are no preventive care reminders to display for this patient.  Lab Results  Component Value Date   TSH 1.84 11/04/2018   Lab Results  Component Value Date   WBC 8.0 09/19/2019   HGB 14.1 09/19/2019   HCT 42.7 09/19/2019   MCV 89.3 09/19/2019   PLT 236 09/19/2019   Lab Results  Component Value Date   NA 139 09/19/2019   K 4.0 09/19/2019   CO2 28 09/19/2019   GLUCOSE 77 09/19/2019   BUN 13 09/19/2019   CREATININE 0.91 09/19/2019   BILITOT 0.5 09/19/2019   ALKPHOS 67 11/04/2018   AST 17 09/19/2019   ALT 11 09/19/2019   PROT 6.4 09/19/2019   ALBUMIN 4.2 11/04/2018   CALCIUM 9.4 09/19/2019   GFR 67.80 11/04/2018   Lab Results  Component Value Date   CHOL 147 09/19/2019   Lab Results  Component Value Date   HDL 74 09/19/2019   Lab Results  Component Value Date   LDLCALC 57 09/19/2019   Lab Results  Component Value Date   TRIG 76 09/19/2019   Lab Results  Component Value Date   CHOLHDL 2.0 09/19/2019   Lab Results  Component Value Date   HGBA1C 5.5 09/19/2019   HGBA1C 5.5 09/19/2019      Assessment & Plan:   Folliculitis type rash under both breast.  This does not appear typical for Candida.  -We recommend a good antibacterial soap such as Lever 2000 or Dial -Doxycycline 100 mg twice daily with food and follow-up with primary if not clearing with the above  Meds ordered this encounter  Medications  . doxycycline (VIBRAMYCIN) 100 MG capsule    Sig: Take 1 capsule (100 mg total) by mouth 2 (two) times daily.    Dispense:  20 capsule    Refill:  0    Follow-up: No follow-ups on file.    Carolann Littler, MD

## 2019-10-20 ENCOUNTER — Ambulatory Visit: Payer: Medicare Other | Admitting: Orthotics

## 2019-10-20 ENCOUNTER — Other Ambulatory Visit: Payer: Self-pay

## 2019-10-20 DIAGNOSIS — M79671 Pain in right foot: Secondary | ICD-10-CM

## 2019-10-20 DIAGNOSIS — E1142 Type 2 diabetes mellitus with diabetic polyneuropathy: Secondary | ICD-10-CM

## 2019-10-20 DIAGNOSIS — G5752 Tarsal tunnel syndrome, left lower limb: Secondary | ICD-10-CM

## 2019-10-20 DIAGNOSIS — Q666 Other congenital valgus deformities of feet: Secondary | ICD-10-CM

## 2019-10-20 NOTE — Progress Notes (Signed)
Patient came in today to pick up custom made foot orthotics.  The goals were accomplished and the patient reported no dissatisfaction with said orthotics.  Patient was advised of breakin period and how to report any issues. 

## 2019-10-24 ENCOUNTER — Ambulatory Visit: Payer: Medicare Other | Admitting: Podiatry

## 2019-11-01 ENCOUNTER — Other Ambulatory Visit: Payer: Self-pay | Admitting: Family Medicine

## 2019-11-01 DIAGNOSIS — E785 Hyperlipidemia, unspecified: Secondary | ICD-10-CM

## 2019-11-12 ENCOUNTER — Encounter: Payer: Self-pay | Admitting: Family Medicine

## 2019-11-14 ENCOUNTER — Ambulatory Visit (INDEPENDENT_AMBULATORY_CARE_PROVIDER_SITE_OTHER): Payer: Medicare Other | Admitting: Podiatry

## 2019-11-14 ENCOUNTER — Other Ambulatory Visit: Payer: Self-pay

## 2019-11-14 DIAGNOSIS — E1142 Type 2 diabetes mellitus with diabetic polyneuropathy: Secondary | ICD-10-CM

## 2019-11-14 DIAGNOSIS — B351 Tinea unguium: Secondary | ICD-10-CM

## 2019-11-14 DIAGNOSIS — M79675 Pain in left toe(s): Secondary | ICD-10-CM

## 2019-11-14 DIAGNOSIS — Z79899 Other long term (current) drug therapy: Secondary | ICD-10-CM

## 2019-11-14 DIAGNOSIS — M79674 Pain in right toe(s): Secondary | ICD-10-CM

## 2019-11-18 ENCOUNTER — Encounter: Payer: Self-pay | Admitting: Podiatry

## 2019-11-18 ENCOUNTER — Other Ambulatory Visit: Payer: Self-pay

## 2019-11-18 ENCOUNTER — Other Ambulatory Visit: Payer: Self-pay | Admitting: Family Medicine

## 2019-11-18 ENCOUNTER — Ambulatory Visit (INDEPENDENT_AMBULATORY_CARE_PROVIDER_SITE_OTHER): Payer: Medicare Other | Admitting: Orthopaedic Surgery

## 2019-11-18 ENCOUNTER — Encounter: Payer: Self-pay | Admitting: Orthopaedic Surgery

## 2019-11-18 VITALS — Ht 61.0 in | Wt 160.0 lb

## 2019-11-18 DIAGNOSIS — F322 Major depressive disorder, single episode, severe without psychotic features: Secondary | ICD-10-CM

## 2019-11-18 DIAGNOSIS — M170 Bilateral primary osteoarthritis of knee: Secondary | ICD-10-CM

## 2019-11-18 DIAGNOSIS — M1712 Unilateral primary osteoarthritis, left knee: Secondary | ICD-10-CM

## 2019-11-18 DIAGNOSIS — M2550 Pain in unspecified joint: Secondary | ICD-10-CM

## 2019-11-18 DIAGNOSIS — F419 Anxiety disorder, unspecified: Secondary | ICD-10-CM

## 2019-11-18 MED ORDER — LIDOCAINE HCL 1 % IJ SOLN
2.0000 mL | INTRAMUSCULAR | Status: AC | PRN
  Administered 2019-11-18: 2 mL

## 2019-11-18 MED ORDER — MELOXICAM 7.5 MG PO TABS: 7.5000 mg | ORAL_TABLET | Freq: Two times a day (BID) | ORAL | 1 refills | Status: DC | PRN

## 2019-11-18 MED ORDER — BUPIVACAINE HCL 0.5 % IJ SOLN
2.0000 mL | INTRAMUSCULAR | Status: AC | PRN
  Administered 2019-11-18: 2 mL via INTRA_ARTICULAR

## 2019-11-18 MED ORDER — METHYLPREDNISOLONE ACETATE 40 MG/ML IJ SUSP
80.0000 mg | INTRAMUSCULAR | Status: AC | PRN
  Administered 2019-11-18: 80 mg via INTRA_ARTICULAR

## 2019-11-18 MED ORDER — ESCITALOPRAM OXALATE 10 MG PO TABS: 20.0000 mg | ORAL_TABLET | Freq: Every day | ORAL | 3 refills | Status: DC

## 2019-11-18 NOTE — Progress Notes (Signed)
Office Visit Note   Patient: Renee Bauer           Date of Birth: 26-May-1950           MRN: [id number] Visit Date: 11/18/2019              Requested by: Caren Macadam, MD [place],  [place] [id number] PCP: Caren Macadam, MD   Assessment & Plan: Visit Diagnoses:  1. Primary osteoarthritis of both knees   2. Unilateral primary osteoarthritis, left knee     Plan:  #1: Corticosteroid injection was given to the left knee without difficulty.  She tolerated the procedure well. #2: She will call us back whenever it has been the 6 months for the viscosupplementation and we can hopefully then order it and injected afterwards.  #3: She can use Voltaren gel to the knee as necessary.  Follow-Up Instructions: No follow-ups on file.   Orders:  No orders of the defined types were placed in this encounter.  No orders of the defined types were placed in this encounter.     Procedures: Large Joint Inj: L knee on 11/18/2019 4:10 PM Indications: pain and diagnostic evaluation Details: 25 G 1.5 in needle, anteromedial approach  Arthrogram: No  Medications: 2 mL lidocaine 1 %; 2 mL bupivacaine 0.5 %; 80 mg methylPREDNISolone acetate 40 MG/ML  2 mL of betamethasone was injected Procedure, treatment alternatives, risks and benefits explained, specific risks discussed. Consent was given by the patient. Immediately prior to procedure a time out was called to verify the correct patient, procedure, equipment, support staff and site/side marked as required. Patient was prepped and draped in the usual sterile fashion.       Clinical Data: No additional findings.   Subjective: Chief Complaint  Patient presents with  . Left Knee - Pain   HPI Patient presents today for left knee pain. She had her last cortisone injection on 03/18/2019 and finished Synvisc on 06/25/2019. Patient states that her pain never went away completely, but the injections did help. She  is wanting to get another cortisone injection today. She takes meloxicam at bedtime.    Review of Systems  Constitutional: Negative for fatigue.  HENT: Negative for ear pain.   Eyes: Negative for pain.  Respiratory: Negative for shortness of breath.   Cardiovascular: Negative for leg swelling.  Gastrointestinal: Negative for constipation and diarrhea.  Endocrine: Negative for cold intolerance and heat intolerance.  Genitourinary: Negative for difficulty urinating.  Musculoskeletal: Negative for joint swelling.  Skin: Negative for rash.  Allergic/Immunologic: Negative for food allergies.  Neurological: Negative for weakness.  Hematological: Does not bruise/bleed easily.  Psychiatric/Behavioral: Positive for sleep disturbance.     Objective: Vital Signs: Ht 5\' 1"  (1.549 m)   Wt 160 lb (72.6 kg)   BMI 30.23 kg/m   Physical Exam Constitutional:      Appearance: Normal appearance.  HENT:     Head: Normocephalic.     Nose: Nose normal.     Mouth/Throat:     Mouth: Mucous membranes are moist.  Eyes:     Extraocular Movements: Extraocular movements intact.     Conjunctiva/sclera: Conjunctivae normal.  Cardiovascular:     Rate and Rhythm: Normal rate.     Pulses: Normal pulses.  Pulmonary:     Effort: Pulmonary effort is normal.  Musculoskeletal:        General: Swelling (left knee) and tenderness present.  Skin:    General: Skin is  warm and dry.  Neurological:     General: No focal deficit present.     Mental Status: She is alert and oriented to person, place, and time.     Ortho Exam  Exam today reveals the need to have a mild effusion at best.  No warmth or erythema.  Tender over the medial joint line.  She does have some crepitance with patellofemoral joint motion.  Ligamentously stable.   Specialty Comments:  No specialty comments available.  Imaging: No results found.   PMFS History: Current Outpatient Medications  Medication Sig Dispense Refill  .  ALPRAZolam (XANAX) 1 MG tablet Take 1 tablet (1 mg total) by mouth as needed for anxiety. 30 tablet 2  . CALCIUM CITRATE PO Take 600 mg by mouth daily.    . Cholecalciferol (VITAMIN D3 PO) Take 2,000 Units by mouth daily.    Marland Kitchen escitalopram (LEXAPRO) 10 MG tablet Take 2 tablets (20 mg total) by mouth daily. 180 tablet 3  . fluticasone (FLONASE) 50 MCG/ACT nasal spray     . gabapentin (NEURONTIN) 100 MG capsule TAKE 1 CAPSULE THREE TIMES A DAY 90 capsule 11  . meloxicam (MOBIC) 7.5 MG tablet Take 1 tablet (7.5 mg total) by mouth 2 (two) times daily as needed for pain. 180 tablet 1  . METRONIDAZOLE, TOPICAL, 0.75 % LOTN as needed.     . pantoprazole (PROTONIX) 40 MG tablet Take 1 tablet (40 mg total) by mouth daily. 90 tablet 3  . rosuvastatin (CRESTOR) 10 MG tablet TAKE 1 TABLET AT BEDTIME 90 tablet 1  . TURMERIC CURCUMIN PO Take 500 mg by mouth daily.     No current facility-administered medications for this visit.    Patient Active Problem List   Diagnosis Date Noted  . Primary osteoarthritis of both hands 07/06/2019  . Primary osteoarthritis of both knees 07/06/2019  . Unilateral primary osteoarthritis, left knee 10/31/2018  . Arthritis 10/30/2018  . DM (diabetes mellitus) type II controlled, neurological manifestation ([place]) 10/30/2018  . Neuropathy of both feet 10/30/2018  . Anxiety 10/30/2018  . Insomnia 10/30/2018  . History of skin cancer 10/30/2018  . Osteopenia 09/30/2018   Past Medical History:  Diagnosis Date  . Anxiety   . Arthritis   . Benign colon polyp 2018  . Bursitis   . Depression   . Diabetes mellitus without complication ([place])   . GERD (gastroesophageal reflux disease)   . Hyperlipidemia   . Rosacea     Family History  Problem Relation Age of Onset  . Diabetes Mother   . Arrhythmia Mother   . High blood pressure Mother   . Mesothelioma Father        asbestos exposure  . Healthy Sister   . Brain cancer Maternal Aunt   . Post-traumatic stress disorder  Son   . Scoliosis Son     Past Surgical History:  Procedure Laterality Date  . ABDOMINAL HYSTERECTOMY     menorrhagia  . BLADDER REPAIR    . BREAST BIOPSY Left   . BUNIONECTOMY Right   . COLON RESECTION  2007  . DILATION AND CURETTAGE OF UTERUS     Social History   Occupational History  . Not on file  Tobacco Use  . Smoking status: Never Smoker  . Smokeless tobacco: Never Used  Vaping Use  . Vaping Use: Never used  Substance and Sexual Activity  . Alcohol use: Yes    Comment: occasional wine  . Drug use: Never  . Sexual  activity: Not on file

## 2019-11-18 NOTE — Progress Notes (Signed)
Subjective:  Patient ID: [facility], female    DOB: 06-03-1950,  MRN: [id number]  Chief Complaint  Patient presents with  . routine foot care    nail trim     69 y.o. female presents with the above complaint.  Patient presents with a follow-up of thickened elongated distal color dystrophic painful toenails x10.  She is unable to debride on herself.  She would like for [place] to have them debrided down.  She also has secondary complaint of undergoing Lamisil therapy which helped resolve some of her nail fungus growth.  Overall she is very happy with the Lamisil therapy she does not need any further ones.  She is a diabetic.  Last A1c of 5.5.   Review of Systems: Negative except as noted in the HPI. Denies N/V/F/Ch.  Past Medical History:  Diagnosis Date  . Anxiety   . Arthritis   . Benign colon polyp 2018  . Bursitis   . Depression   . Diabetes mellitus without complication ([place])   . GERD (gastroesophageal reflux disease)   . Hyperlipidemia   . Rosacea     Current Outpatient Medications:  .  ALPRAZolam (XANAX) 1 MG tablet, Take 1 tablet (1 mg total) by mouth as needed for anxiety., Disp: 30 tablet, Rfl: 2 .  CALCIUM CITRATE PO, Take 600 mg by mouth daily., Disp: , Rfl:  .  Cholecalciferol (VITAMIN D3 PO), Take 2,000 Units by mouth daily., Disp: , Rfl:  .  doxycycline (VIBRAMYCIN) 100 MG capsule, Take 1 capsule (100 mg total) by mouth 2 (two) times daily., Disp: 20 capsule, Rfl: 0 .  escitalopram (LEXAPRO) 10 MG tablet, Take 1 tablet (10 mg total) by mouth daily., Disp: 90 tablet, Rfl: 3 .  fluticasone (FLONASE) 50 MCG/ACT nasal spray, , Disp: , Rfl:  .  gabapentin (NEURONTIN) 100 MG capsule, TAKE 1 CAPSULE THREE TIMES A DAY, Disp: 90 capsule, Rfl: 11 .  Glucosamine-Chondroitin (GLUCOSAMINE CHONDR COMPLEX PO), Take by mouth., Disp: , Rfl:  .  meloxicam (MOBIC) 7.5 MG tablet, Take 1 tablet (7.5 mg total) by mouth 2 (two) times daily as needed for pain., Disp: 180 tablet, Rfl:  0 .  METRONIDAZOLE, TOPICAL, 0.75 % LOTN, as needed. , Disp: , Rfl:  .  pantoprazole (PROTONIX) 40 MG tablet, Take 1 tablet (40 mg total) by mouth daily., Disp: 90 tablet, Rfl: 3 .  rosuvastatin (CRESTOR) 10 MG tablet, TAKE 1 TABLET AT BEDTIME, Disp: 90 tablet, Rfl: 1 .  TURMERIC CURCUMIN PO, Take 500 mg by mouth daily., Disp: , Rfl:   Social History   Tobacco Use  Smoking Status Never Smoker  Smokeless Tobacco Never Used    Allergies  Allergen Reactions  . Wellbutrin [Bupropion]     Muscle twitches  . Clindamycin Hcl Rash   Objective:  There were no vitals filed for this visit. There is no height or weight on file to calculate BMI. Constitutional Well developed. Well nourished.  Vascular Dorsalis pedis pulses palpable bilaterally. Posterior tibial pulses palpable bilaterally. Capillary refill normal to all digits.  No cyanosis or clubbing noted. Pedal hair growth normal.  Neurologic Normal speech. Oriented to person, place, and time. Epicritic sensation to light touch grossly present bilaterally. Negative Tinel's sign availing sign.  No pain on palpation to the left lateral tarsal tunnel region.  No heel pain no Achilles tendinitis ATFL or posterior tibial pain. Nail Exam: Pt has thick disfigured discolored nails with subungual debris noted bilateral entire nail hallux through fifth  toenails with an improvement to the left second digit  Dermatologic Nails well groomed and normal in appearance. No open wounds. No skin lesions.  Orthopedic:  No pain on palpation to anywhere on the left foot.  Manual muscle testing 5 out of 5.  Mild hammertoe contractures noted bilaterally 2 through 5.  Hallux rigidus noted to bilateral first metatarsophalangeal joint with right greater than left.    Radiographs: None Assessment:   1. Controlled type 2 diabetes mellitus with diabetic polyneuropathy, without long-term current use of insulin (HCC)   2. Pain due to onychomycosis of toenails of  both feet   3. Encounter for long-term (current) use of high-risk medication    Plan:  Patient was evaluated and treated and all questions answered.  Left foot tarsal tunnel syndrome -Resolved after undergoing gabapentin management and injection.  Onychomycosis with pain with left second digit being worse -Nails palliatively debrided as below. -Educated on self-care -She has completed the Lamisil therapy.  With one 65-month course there is a complete improvement of the nail fungus.  Overall patient does not need any further Lamisil therapy  Pes planovalgus with associated hallux rigidus -I explained the patient the etiology of hallux rigidus and various treatment options were extensively discussed.  Clinically patient has a little bit of intra-articular pain I believe she will benefit from custom-made orthotics to help address the hallux rigidus component with Morton's extension as well as pes planus deformity.  Patient states understanding would like to proceed with getting orthotics. -Patient has picked up her custom-made orthotics.  Procedure: Nail Debridement Rationale: pain  Type of Debridement: manual, sharp debridement. Instrumentation: Nail nipper, rotary burr. Number of Nails: 10  Procedures and Treatment: Consent by patient was obtained for treatment procedures. The patient understood the discussion of treatment and procedures well. All questions were answered thoroughly reviewed. Debridement of mycotic and hypertrophic toenails, 1 through 5 bilateral and clearing of subungual debris. No ulceration, no infection noted.  Return Visit-Office Procedure: Patient instructed to return to the office for a follow up visit 3 months for continued evaluation and treatment.  Boneta Lucks, DPM    No follow-ups on file.

## 2019-11-22 ENCOUNTER — Encounter: Payer: Self-pay | Admitting: Family Medicine

## 2019-11-25 ENCOUNTER — Telehealth: Payer: Self-pay | Admitting: *Deleted

## 2019-11-25 NOTE — Telephone Encounter (Signed)
Prior auth for Escitalopram 10mg -to take 2 tablets daily was sent to [email or web address]-key [id number].   Approvedtoday AXENMM:[id number];SRPRXY:[email or web address];Review Type:Qty;Coverage Start Date:10/26/2019;Coverage End Date:11/24/2020.  Spoke with the pt and informed her of this also.

## 2019-11-28 ENCOUNTER — Other Ambulatory Visit: Payer: Self-pay | Admitting: Family Medicine

## 2019-11-28 DIAGNOSIS — K219 Gastro-esophageal reflux disease without esophagitis: Secondary | ICD-10-CM

## 2020-01-02 ENCOUNTER — Encounter: Payer: Self-pay | Admitting: Family Medicine

## 2020-01-05 MED ORDER — ESOMEPRAZOLE MAGNESIUM 40 MG PO CPDR: 40.0000 mg | DELAYED_RELEASE_CAPSULE | Freq: Every day | ORAL | 1 refills | Status: DC

## 2020-01-05 NOTE — Telephone Encounter (Signed)
Spoke with the pt and offered an appt with another provider as PCP does not have any openings.  Patient declined, stated she would prefer to wait and asked if the Rx for Nexium could be sent to [facility]?  Message sent to PCP to send the Rx.

## 2020-01-13 ENCOUNTER — Telehealth: Payer: Self-pay

## 2020-01-13 NOTE — Telephone Encounter (Signed)
Patient called requesting a copy of the list of supplements that Dr. Estanislado Pandy recommends.   Please mail to: Renee Bauer [address] [place], [place]  [address]

## 2020-01-13 NOTE — Telephone Encounter (Signed)
Mailed supplement list to patient.

## 2020-01-15 ENCOUNTER — Encounter: Payer: Self-pay | Admitting: Family Medicine

## 2020-01-27 ENCOUNTER — Telehealth: Payer: Self-pay | Admitting: Family Medicine

## 2020-01-27 NOTE — Telephone Encounter (Signed)
Left message for patient to call back and schedule Medicare Annual Wellness Visit (AWV) either virtually or in office.   Last AWV no information please schedule at anytime with [facility] Nurse Health Advisor 1 or 2   This should be a 45 minute visit. 

## 2020-02-03 ENCOUNTER — Ambulatory Visit: Payer: Medicare Other

## 2020-02-12 ENCOUNTER — Ambulatory Visit (INDEPENDENT_AMBULATORY_CARE_PROVIDER_SITE_OTHER): Payer: Medicare Other | Admitting: Orthopaedic Surgery

## 2020-02-12 ENCOUNTER — Encounter: Payer: Self-pay | Admitting: Orthopaedic Surgery

## 2020-02-12 ENCOUNTER — Other Ambulatory Visit: Payer: Self-pay

## 2020-02-12 VITALS — Ht 61.0 in | Wt 160.0 lb

## 2020-02-12 DIAGNOSIS — M170 Bilateral primary osteoarthritis of knee: Secondary | ICD-10-CM

## 2020-02-12 MED ORDER — BUPIVACAINE HCL 0.25 % IJ SOLN
2.0000 mL | INTRAMUSCULAR | Status: AC | PRN
  Administered 2020-02-12: 2 mL via INTRA_ARTICULAR

## 2020-02-12 NOTE — Progress Notes (Signed)
Office Visit Note   Patient: Renee Bauer           Date of Birth: 1951/01/16           MRN: [id number] Visit Date: 02/12/2020              Requested by: Caren Macadam, MD [place],  [place] [address] PCP: Caren Macadam, MD   Assessment & Plan: Visit Diagnoses:  1. Primary osteoarthritis of both knees     Plan: Recurrent symptoms of osteoarthritis right knee with a small effusion.  No evidence of any acute change.  Will inject with betamethasone and monitor response  Follow-Up Instructions: Return if symptoms worsen or fail to improve.   Orders:  Orders Placed This Encounter  Procedures  . Large Joint Inj: R knee   No orders of the defined types were placed in this encounter.     Procedures: Large Joint Inj: R knee on 02/12/2020 4:22 PM Indications: pain and diagnostic evaluation Details: 25 G 1.5 in needle, anteromedial approach  Arthrogram: No  Medications: 2 mL bupivacaine 0.25 %  2 mL betamethasone injected the medial compartment right knee with Marcaine Procedure, treatment alternatives, risks and benefits explained, specific risks discussed. Consent was given by the patient. Immediately prior to procedure a time out was called to verify the correct patient, procedure, equipment, support staff and site/side marked as required. Patient was prepped and draped in the usual sterile fashion.       Clinical Data: No additional findings.   Subjective: Chief Complaint  Patient presents with  . Right Knee - Pain  . Left Knee - Pain  Patient presents today for bilateral knee pain. She said that she fell this past Sunday, but did not land on either knee. She feels like she may have twisted them during the fall. She has noticed much improvement since the fall. She does still have some anterior right knee pain, and states that her left one is doing well overall. She does have pain with going up or down stairs. She is not taking  anything for pain.  Prior films of both knees in March demonstrated tricompartmental degenerative changes  HPI  Review of Systems   Objective: Vital Signs: Ht 5\' 1"  (1.549 m)   Wt 160 lb (72.6 kg)   BMI 30.23 kg/m   Physical Exam Constitutional:      Appearance: She is well-developed and well-nourished.  HENT:     Mouth/Throat:     Mouth: Oropharynx is clear and moist.  Eyes:     Extraocular Movements: EOM normal.     Pupils: Pupils are equal, round, and reactive to light.  Pulmonary:     Effort: Pulmonary effort is normal.  Skin:    General: Skin is warm and dry.  Neurological:     Mental Status: She is alert and oriented to person, place, and time.  Psychiatric:        Mood and Affect: Mood and affect normal.        Behavior: Behavior normal.     Ortho Exam awake alert and oriented x3.  Comfortable sitting he has a small effusion and predominately subpatellar and medial anterior compartment pain.  No pain laterally.  Full extension of flexed over 100 degrees without instability.  Specialty Comments:  No specialty comments available.  Imaging: No results found.   PMFS History: Patient Active Problem List   Diagnosis Date Noted  . Primary osteoarthritis of both  hands 07/06/2019  . Primary osteoarthritis of both knees 07/06/2019  . Unilateral primary osteoarthritis, left knee 10/31/2018  . Arthritis 10/30/2018  . DM (diabetes mellitus) type II controlled, neurological manifestation ([place]) 10/30/2018  . Neuropathy of both feet 10/30/2018  . Anxiety 10/30/2018  . Insomnia 10/30/2018  . History of skin cancer 10/30/2018  . Osteopenia 09/30/2018   Past Medical History:  Diagnosis Date  . Anxiety   . Arthritis   . Benign colon polyp 2018  . Bursitis   . Depression   . Diabetes mellitus without complication ([place])   . GERD (gastroesophageal reflux disease)   . Hyperlipidemia   . Rosacea     Family History  Problem Relation Age of Onset  . Diabetes Mother    . Arrhythmia Mother   . High blood pressure Mother   . Mesothelioma Father        asbestos exposure  . Healthy Sister   . Brain cancer Maternal Aunt   . Post-traumatic stress disorder Son   . Scoliosis Son     Past Surgical History:  Procedure Laterality Date  . ABDOMINAL HYSTERECTOMY     menorrhagia  . BLADDER REPAIR    . BREAST BIOPSY Left   . BUNIONECTOMY Right   . COLON RESECTION  2007  . DILATION AND CURETTAGE OF UTERUS     Social History   Occupational History  . Not on file  Tobacco Use  . Smoking status: Never Smoker  . Smokeless tobacco: Never Used  Vaping Use  . Vaping Use: Never used  Substance and Sexual Activity  . Alcohol use: Yes    Comment: occasional wine  . Drug use: Never  . Sexual activity: Not on file

## 2020-02-25 ENCOUNTER — Encounter: Payer: Self-pay | Admitting: Podiatry

## 2020-02-25 ENCOUNTER — Ambulatory Visit (INDEPENDENT_AMBULATORY_CARE_PROVIDER_SITE_OTHER): Payer: Medicare Other | Admitting: Podiatry

## 2020-02-25 ENCOUNTER — Other Ambulatory Visit: Payer: Self-pay

## 2020-02-25 DIAGNOSIS — E1142 Type 2 diabetes mellitus with diabetic polyneuropathy: Secondary | ICD-10-CM

## 2020-02-25 DIAGNOSIS — M79674 Pain in right toe(s): Secondary | ICD-10-CM | POA: Diagnosis not present

## 2020-02-25 DIAGNOSIS — B351 Tinea unguium: Secondary | ICD-10-CM | POA: Diagnosis not present

## 2020-02-25 DIAGNOSIS — M79675 Pain in left toe(s): Secondary | ICD-10-CM

## 2020-02-25 NOTE — Progress Notes (Signed)
Subjective:  Patient ID: [facility], female    DOB: March 31, 1950,  MRN: [id number]  Chief Complaint  Patient presents with  . routine foot care    Nail trim     70 y.o. female presents with the above complaint.  Patient presents with a follow-up of thickened elongated distal color dystrophic painful toenails x10.  She is unable to debride on herself.  She would like for [place] to have them debrided down.  She does not have any secondary complaints.  She is a diabetic last A1c 5.5   Review of Systems: Negative except as noted in the HPI. Denies N/V/F/Ch.  Past Medical History:  Diagnosis Date  . Anxiety   . Arthritis   . Benign colon polyp 2018  . Bursitis   . Depression   . Diabetes mellitus without complication ([place])   . GERD (gastroesophageal reflux disease)   . Hyperlipidemia   . Rosacea     Current Outpatient Medications:  .  betamethasone acetate-betamethasone sodium phosphate (CELESTONE) 6 (3-3) MG/ML injection, Inject into the articular space., Disp: , Rfl:  .  lidocaine (XYLOCAINE) 1 % (with preservative) injection, by Infiltration route., Disp: , Rfl:  .  pantoprazole (PROTONIX) 40 MG tablet, , Disp: , Rfl:  .  ALPRAZolam (XANAX) 1 MG tablet, Take 1 tablet (1 mg total) by mouth as needed for anxiety., Disp: 30 tablet, Rfl: 2 .  CALCIUM CITRATE PO, Take 600 mg by mouth daily., Disp: , Rfl:  .  Cholecalciferol (VITAMIN D3 PO), Take 2,000 Units by mouth daily., Disp: , Rfl:  .  escitalopram (LEXAPRO) 10 MG tablet, Take 2 tablets (20 mg total) by mouth daily., Disp: 180 tablet, Rfl: 3 .  esomeprazole (NEXIUM) 40 MG capsule, Take 1 capsule (40 mg total) by mouth daily., Disp: 90 capsule, Rfl: 1 .  fluticasone (FLONASE) 50 MCG/ACT nasal spray, , Disp: , Rfl:  .  gabapentin (NEURONTIN) 100 MG capsule, TAKE 1 CAPSULE THREE TIMES A DAY, Disp: 90 capsule, Rfl: 11 .  meloxicam (MOBIC) 7.5 MG tablet, Take 1 tablet (7.5 mg total) by mouth 2 (two) times daily as needed for pain.,  Disp: 180 tablet, Rfl: 1 .  METRONIDAZOLE, TOPICAL, 0.75 % LOTN, as needed. , Disp: , Rfl:  .  rosuvastatin (CRESTOR) 10 MG tablet, TAKE 1 TABLET AT BEDTIME, Disp: 90 tablet, Rfl: 1 .  TURMERIC CURCUMIN PO, Take 500 mg by mouth daily., Disp: , Rfl:   Social History   Tobacco Use  Smoking Status Never Smoker  Smokeless Tobacco Never Used    Allergies  Allergen Reactions  . Wellbutrin [Bupropion]     Muscle twitches  . Clindamycin Hcl Rash   Objective:  There were no vitals filed for this visit. There is no height or weight on file to calculate BMI. Constitutional Well developed. Well nourished.  Vascular Dorsalis pedis pulses palpable bilaterally. Posterior tibial pulses palpable bilaterally. Capillary refill normal to all digits.  No cyanosis or clubbing noted. Pedal hair growth normal.  Neurologic Normal speech. Oriented to person, place, and time. Epicritic sensation to light touch grossly present bilaterally. Negative Tinel's sign availing sign.  No pain on palpation to the left lateral tarsal tunnel region.  No heel pain no Achilles tendinitis ATFL or posterior tibial pain. Nail Exam: Pt has thick disfigured discolored nails with subungual debris noted bilateral entire nail hallux through fifth toenails with an improvement to the left second digit  Dermatologic Nails well groomed and normal in appearance. No open  wounds. No skin lesions.  Orthopedic:  No pain on palpation to anywhere on the left foot.  Manual muscle testing 5 out of 5.  Mild hammertoe contractures noted bilaterally 2 through 5.  Hallux rigidus noted to bilateral first metatarsophalangeal joint with right greater than left.    Radiographs: None Assessment:   1. Controlled type 2 diabetes mellitus with diabetic polyneuropathy, without long-term current use of insulin (HCC)   2. Pain due to onychomycosis of toenails of both feet    Plan:  Patient was evaluated and treated and all questions  answered.  Left foot tarsal tunnel syndrome -Resolved after undergoing gabapentin management and injection.  Onychomycosis with pain with left second digit being worse -Nails palliatively debrided as below. -Educated on self-care -She has completed the Lamisil therapy.  With one 10-month course there is a complete improvement of the nail fungus.  Overall patient does not need any further Lamisil therapy  Pes planovalgus with associated hallux rigidus -I explained the patient the etiology of hallux rigidus and various treatment options were extensively discussed.  Clinically patient has a little bit of intra-articular pain I believe she will benefit from custom-made orthotics to help address the hallux rigidus component with Morton's extension as well as pes planus deformity.  Patient states understanding would like to proceed with getting orthotics. -Patient has picked up her custom-made orthotics.  Procedure: Nail Debridement Rationale: pain  Type of Debridement: manual, sharp debridement. Instrumentation: Nail nipper, rotary burr. Number of Nails: 10  Procedures and Treatment: Consent by patient was obtained for treatment procedures. The patient understood the discussion of treatment and procedures well. All questions were answered thoroughly reviewed. Debridement of mycotic and hypertrophic toenails, 1 through 5 bilateral and clearing of subungual debris. No ulceration, no infection noted.  Return Visit-Office Procedure: Patient instructed to return to the office for a follow up visit 3 months for continued evaluation and treatment.  Boneta Lucks, DPM    No follow-ups on file.

## 2020-03-01 ENCOUNTER — Encounter: Payer: Self-pay | Admitting: Family Medicine

## 2020-03-01 LAB — HM DIABETES EYE EXAM

## 2020-03-15 ENCOUNTER — Encounter: Payer: Self-pay | Admitting: Family Medicine

## 2020-03-15 DIAGNOSIS — Z8349 Family history of other endocrine, nutritional and metabolic diseases: Secondary | ICD-10-CM

## 2020-03-15 DIAGNOSIS — E538 Deficiency of other specified B group vitamins: Secondary | ICD-10-CM

## 2020-03-15 DIAGNOSIS — E559 Vitamin D deficiency, unspecified: Secondary | ICD-10-CM

## 2020-03-15 DIAGNOSIS — E1142 Type 2 diabetes mellitus with diabetic polyneuropathy: Secondary | ICD-10-CM

## 2020-03-15 DIAGNOSIS — M8589 Other specified disorders of bone density and structure, multiple sites: Secondary | ICD-10-CM

## 2020-03-15 DIAGNOSIS — E785 Hyperlipidemia, unspecified: Secondary | ICD-10-CM

## 2020-03-16 ENCOUNTER — Ambulatory Visit: Payer: Medicare Other | Admitting: Orthopaedic Surgery

## 2020-03-23 ENCOUNTER — Ambulatory Visit (INDEPENDENT_AMBULATORY_CARE_PROVIDER_SITE_OTHER): Payer: Medicare Other | Admitting: Orthopaedic Surgery

## 2020-03-23 ENCOUNTER — Encounter: Payer: Self-pay | Admitting: Orthopaedic Surgery

## 2020-03-23 ENCOUNTER — Other Ambulatory Visit: Payer: Self-pay

## 2020-03-23 VITALS — Ht 61.0 in | Wt 160.0 lb

## 2020-03-23 DIAGNOSIS — M1712 Unilateral primary osteoarthritis, left knee: Secondary | ICD-10-CM

## 2020-03-23 DIAGNOSIS — M170 Bilateral primary osteoarthritis of knee: Secondary | ICD-10-CM

## 2020-03-23 MED ORDER — BUPIVACAINE HCL 0.25 % IJ SOLN
2.0000 mL | INTRAMUSCULAR | Status: AC | PRN
  Administered 2020-03-23: 2 mL via INTRA_ARTICULAR

## 2020-03-23 NOTE — Progress Notes (Signed)
Office Visit Note   Patient: Renee Bauer           Date of Birth: 07-02-1950           MRN: [id number] Visit Date: 03/23/2020              Requested by: Caren Macadam, MD [place],  [place] [address] PCP: Caren Macadam, MD   Assessment & Plan: Visit Diagnoses:  1. Primary osteoarthritis of both knees     Plan: Mrs. Gens has bilateral knee osteoarthritis.  She recently had a cortisone injection in the right knee that made a big difference and wishes to have a cortisone injection in the left knee.  She has had prior films demonstrating significant arthritis particularly in the medial compartments  Follow-Up Instructions: Return if symptoms worsen or fail to improve.   Orders:  No orders of the defined types were placed in this encounter.  No orders of the defined types were placed in this encounter.     Procedures: Large Joint Inj: L knee on 03/23/2020 4:11 PM Indications: pain and diagnostic evaluation Details: 25 G 1.5 in needle, anteromedial approach  Arthrogram: No  Medications: 2 mL bupivacaine 0.25 %  12 mg betamethasone injected into the medial compartment left knee with the Marcaine Procedure, treatment alternatives, risks and benefits explained, specific risks discussed. Consent was given by the patient. Patient was prepped and draped in the usual sterile fashion.       Clinical Data: No additional findings.   Subjective: Chief Complaint  Patient presents with  . Left Knee - Pain  . Right Knee - Pain  Patient presents today for left knee pain. She is wanting to have it injected with cortisone today. She had her right knee injected last month and states that it is doing well since the injection. She does not take anything for pain.   HPI  Review of Systems   Objective: Vital Signs: Ht 5\' 1"  (1.549 m)   Wt 160 lb (72.6 kg)   BMI 30.23 kg/m   Physical Exam Constitutional:      Appearance: She is  well-developed and well-nourished.  HENT:     Mouth/Throat:     Mouth: Oropharynx is clear and moist.  Eyes:     Extraocular Movements: EOM normal.     Pupils: Pupils are equal, round, and reactive to light.  Pulmonary:     Effort: Pulmonary effort is normal.  Skin:    General: Skin is warm and dry.  Neurological:     Mental Status: She is alert and oriented to person, place, and time.  Psychiatric:        Mood and Affect: Mood and affect normal.        Behavior: Behavior normal.     Ortho Exam awake alert and oriented x3.  Comfortable sitting.  Slight varus left knee.  No effusion.  Predominant medial joint pain that is mild.  Full extension flexed over 100 degrees without instability.  Some patella crepitation but no pain with patella compression  Specialty Comments:  No specialty comments available.  Imaging: No results found.   PMFS History: Patient Active Problem List   Diagnosis Date Noted  . Primary osteoarthritis of both hands 07/06/2019  . Primary osteoarthritis of both knees 07/06/2019  . Unilateral primary osteoarthritis, left knee 10/31/2018  . Arthritis 10/30/2018  . DM (diabetes mellitus) type II controlled, neurological manifestation ([place]) 10/30/2018  . Neuropathy of both feet  10/30/2018  . Anxiety 10/30/2018  . Insomnia 10/30/2018  . History of skin cancer 10/30/2018  . Osteopenia 09/30/2018   Past Medical History:  Diagnosis Date  . Anxiety   . Arthritis   . Benign colon polyp 2018  . Bursitis   . Depression   . Diabetes mellitus without complication ([place])   . GERD (gastroesophageal reflux disease)   . Hyperlipidemia   . Rosacea     Family History  Problem Relation Age of Onset  . Diabetes Mother   . Arrhythmia Mother   . High blood pressure Mother   . Mesothelioma Father        asbestos exposure  . Healthy Sister   . Brain cancer Maternal Aunt   . Post-traumatic stress disorder Son   . Scoliosis Son     Past Surgical History:   Procedure Laterality Date  . ABDOMINAL HYSTERECTOMY     menorrhagia  . BLADDER REPAIR    . BREAST BIOPSY Left   . BUNIONECTOMY Right   . COLON RESECTION  2007  . DILATION AND CURETTAGE OF UTERUS     Social History   Occupational History  . Not on file  Tobacco Use  . Smoking status: Never Smoker  . Smokeless tobacco: Never Used  Vaping Use  . Vaping Use: Never used  Substance and Sexual Activity  . Alcohol use: Yes    Comment: occasional wine  . Drug use: Never  . Sexual activity: Not on file

## 2020-03-24 ENCOUNTER — Ambulatory Visit (INDEPENDENT_AMBULATORY_CARE_PROVIDER_SITE_OTHER): Payer: Medicare Other | Admitting: Family Medicine

## 2020-03-24 ENCOUNTER — Encounter: Payer: Self-pay | Admitting: Family Medicine

## 2020-03-24 ENCOUNTER — Ambulatory Visit (INDEPENDENT_AMBULATORY_CARE_PROVIDER_SITE_OTHER): Payer: Medicare Other

## 2020-03-24 VITALS — BP 102/82 | HR 98 | Temp 98.1°F | Ht 62.75 in | Wt 163.0 lb

## 2020-03-24 DIAGNOSIS — E559 Vitamin D deficiency, unspecified: Secondary | ICD-10-CM | POA: Diagnosis not present

## 2020-03-24 DIAGNOSIS — Z8349 Family history of other endocrine, nutritional and metabolic diseases: Secondary | ICD-10-CM

## 2020-03-24 DIAGNOSIS — M545 Low back pain, unspecified: Secondary | ICD-10-CM | POA: Diagnosis not present

## 2020-03-24 DIAGNOSIS — F419 Anxiety disorder, unspecified: Secondary | ICD-10-CM | POA: Diagnosis not present

## 2020-03-24 DIAGNOSIS — E785 Hyperlipidemia, unspecified: Secondary | ICD-10-CM

## 2020-03-24 DIAGNOSIS — E1142 Type 2 diabetes mellitus with diabetic polyneuropathy: Secondary | ICD-10-CM

## 2020-03-24 DIAGNOSIS — J0100 Acute maxillary sinusitis, unspecified: Secondary | ICD-10-CM

## 2020-03-24 DIAGNOSIS — G4709 Other insomnia: Secondary | ICD-10-CM

## 2020-03-24 DIAGNOSIS — M8589 Other specified disorders of bone density and structure, multiple sites: Secondary | ICD-10-CM

## 2020-03-24 DIAGNOSIS — E538 Deficiency of other specified B group vitamins: Secondary | ICD-10-CM | POA: Diagnosis not present

## 2020-03-24 DIAGNOSIS — Z0000 Encounter for general adult medical examination without abnormal findings: Secondary | ICD-10-CM

## 2020-03-24 DIAGNOSIS — H6123 Impacted cerumen, bilateral: Secondary | ICD-10-CM

## 2020-03-24 LAB — CBC WITH DIFFERENTIAL/PLATELET
Basophils Absolute: 0 10*3/uL (ref 0.0–0.1)
Basophils Relative: 0.2 % (ref 0.0–3.0)
Eosinophils Absolute: 0 10*3/uL (ref 0.0–0.7)
Eosinophils Relative: 0 % (ref 0.0–5.0)
HCT: 42.8 % (ref 36.0–46.0)
Hemoglobin: 14.5 g/dL (ref 12.0–15.0)
Lymphocytes Relative: 14.7 % (ref 12.0–46.0)
Lymphs Abs: 0.9 10*3/uL (ref 0.7–4.0)
MCHC: 33.8 g/dL (ref 30.0–36.0)
MCV: 87.8 fl (ref 78.0–100.0)
Monocytes Absolute: 0.2 10*3/uL (ref 0.1–1.0)
Monocytes Relative: 3.4 % (ref 3.0–12.0)
Neutro Abs: 5.2 10*3/uL (ref 1.4–7.7)
Neutrophils Relative %: 81.7 % — ABNORMAL HIGH (ref 43.0–77.0)
Platelets: 267 10*3/uL (ref 150.0–400.0)
RBC: 4.87 Mil/uL (ref 3.87–5.11)
RDW: 12.9 % (ref 11.5–15.5)
WBC: 6.3 10*3/uL (ref 4.0–10.5)

## 2020-03-24 LAB — COMPREHENSIVE METABOLIC PANEL
ALT: 13 U/L (ref 0–35)
AST: 17 U/L (ref 0–37)
Albumin: 4.4 g/dL (ref 3.5–5.2)
Alkaline Phosphatase: 70 U/L (ref 39–117)
BUN: 12 mg/dL (ref 6–23)
CO2: 25 mEq/L (ref 19–32)
Calcium: 10.1 mg/dL (ref 8.4–10.5)
Chloride: 102 mEq/L (ref 96–112)
Creatinine, Ser: 0.84 mg/dL (ref 0.40–1.20)
GFR: 71.85 mL/min (ref >60.00)
Glucose, Bld: 123 mg/dL — ABNORMAL HIGH (ref 70–99)
Potassium: 4.1 mEq/L (ref 3.5–5.1)
Sodium: 137 mEq/L (ref 135–145)
Total Bilirubin: 0.5 mg/dL (ref 0.2–1.2)
Total Protein: 7 g/dL (ref 6.0–8.3)

## 2020-03-24 LAB — LIPID PANEL
Cholesterol: 133 mg/dL (ref 0–200)
HDL: 68.4 mg/dL (ref >39.00)
LDL Cholesterol: 56 mg/dL (ref 0–99)
NonHDL: 64.39
Total CHOL/HDL Ratio: 2
Triglycerides: 41 mg/dL (ref 0.0–149.0)
VLDL: 8.2 mg/dL (ref 0.0–40.0)

## 2020-03-24 LAB — MICROALBUMIN / CREATININE URINE RATIO
Creatinine,U: 210.3 mg/dL
Microalb Creat Ratio: 1.2 mg/g (ref 0.0–30.0)
Microalb, Ur: 2.4 mg/dL — ABNORMAL HIGH (ref 0.0–1.9)

## 2020-03-24 LAB — IBC PANEL
Iron: 79 ug/dL (ref 42–145)
Saturation Ratios: 20.9 % (ref 20.0–50.0)
Transferrin: 270 mg/dL (ref 212.0–360.0)

## 2020-03-24 LAB — VITAMIN D 25 HYDROXY (VIT D DEFICIENCY, FRACTURES): VITD: 64.49 ng/mL (ref 30.00–100.00)

## 2020-03-24 LAB — HEMOGLOBIN A1C: Hgb A1c MFr Bld: 5.9 % (ref 4.6–6.5)

## 2020-03-24 LAB — FERRITIN: Ferritin: 36 ng/mL (ref 10.0–291.0)

## 2020-03-24 LAB — VITAMIN B12: Vitamin B-12: 1355 pg/mL — ABNORMAL HIGH (ref 211–911)

## 2020-03-24 MED ORDER — AZELASTINE & FLUTICASONE 137 & 50 MCG/ACT NA THPK: 2.0000 | PACK | Freq: Two times a day (BID) | NASAL | 5 refills | Status: DC

## 2020-03-24 MED ORDER — ZOLPIDEM TARTRATE ER 6.25 MG PO TBCR: 6.2500 mg | EXTENDED_RELEASE_TABLET | Freq: Every evening | ORAL | 3 refills | Status: DC | PRN

## 2020-03-24 MED ORDER — AMOXICILLIN-POT CLAVULANATE 875-125 MG PO TABS: 1.0000 | ORAL_TABLET | Freq: Two times a day (BID) | ORAL | 0 refills | Status: AC

## 2020-03-24 NOTE — Patient Instructions (Signed)
Consider returning for nurse visit (you can call) for pneumococcal pneumonia 23, flu shot. Don't forget about getting your 3rd COVID vaccination (you can schedule this online [email or web address])

## 2020-03-24 NOTE — Progress Notes (Signed)
Renee Bauer DOB: 1950/08/29 Encounter date: 03/24/2020  This is a 70 y.o. female who presents for complete physical   History of present illness/Additional concerns:  Stopped taking the lexapro gradually - quit around 2/11 - was eating more and couldn't sleep. Went down to 1 and didn't feel much better so slowly tapered self off. Has been off for 2 weeks.   Has had head cold for 4 weeks - started with congestion. Always takes loratadine. Saw urgent care. Negative covid. Tried sudafed - just dry and can't fully kick this. Having to breath through mouth; which is keeping her miserable. Saline rinses help some. No fevers. No chest congestion. Has maxillary discomfort. Uses claritin in day, flonase at night. Has been dealing with this for a long time.   Insomnia: all she can get is 4 hours of sleep and this is just with the zolpidem.   Mammogram completed 10/13/19 with [place] for follow up.  She is on topical yeast cream/powder to help with rash under breasts.  Wants ears looked at; was told there was wax in there at urgent care.  Has been dealing with low back pain - coming and going. Has been bothering her for over a couple of months. She is riding stationary bike regularly. Thought she hurt it at gym, but not improving. Has been seeing chiropractor - just comes and goes so hard to say if it is improving. Feels like bruise/catch when she goes to stand up.   Colonoscopy repeat due 05/2021  Past Medical History:  Diagnosis Date   Anxiety    Arthritis    Benign colon polyp 2018   Bursitis    Depression    Diabetes mellitus without complication (HCC)    GERD (gastroesophageal reflux disease)    Hyperlipidemia    Rosacea    Past Surgical History:  Procedure Laterality Date   ABDOMINAL HYSTERECTOMY     menorrhagia   BLADDER REPAIR     BREAST BIOPSY Left    BUNIONECTOMY Right    COLON RESECTION  2007   DILATION AND CURETTAGE OF UTERUS     Allergies  Allergen Reactions    Wellbutrin [Bupropion]     Muscle twitches   Clindamycin Hcl Rash   Current Meds  Medication Sig   ALPRAZolam (XANAX) 1 MG tablet Take 1 tablet (1 mg total) by mouth as needed for anxiety.   amoxicillin-clavulanate (AUGMENTIN) 875-125 MG tablet Take 1 tablet by mouth 2 (two) times daily for 7 days.   Azelastine & Fluticasone 137 & 50 MCG/ACT THPK Place 2 sprays into the nose in the morning and at bedtime.   betamethasone acetate-betamethasone sodium phosphate (CELESTONE) 6 (3-3) MG/ML injection Inject into the articular space.   CALCIUM CITRATE PO Take 600 mg by mouth daily.   Cholecalciferol (VITAMIN D3 PO) Take 2,000 Units by mouth daily.   esomeprazole (NEXIUM) 40 MG capsule Take 1 capsule (40 mg total) by mouth daily.   gabapentin (NEURONTIN) 100 MG capsule TAKE 1 CAPSULE THREE TIMES A DAY   Ginger, Zingiber officinalis, (GINGER ROOT PO) Take by mouth.   lidocaine (XYLOCAINE) 1 % (with preservative) injection by Infiltration route.   loratadine (CLARITIN) 10 MG tablet Take 10 mg by mouth daily.   Melatonin 10 MG TABS Take by mouth at bedtime.   meloxicam (MOBIC) 7.5 MG tablet Take 1 tablet (7.5 mg total) by mouth 2 (two) times daily as needed for pain.   METRONIDAZOLE, TOPICAL, 0.75 % LOTN as needed.  OVER THE COUNTER MEDICATION Zeasorb powder   rosuvastatin (CRESTOR) 10 MG tablet TAKE 1 TABLET AT BEDTIME   TART CHERRY PO Take 1,000 mg by mouth.   TERBINAFINE EX Apply topically.   TURMERIC CURCUMIN PO Take 500 mg by mouth daily.   vitamin B-12 (CYANOCOBALAMIN) 1000 MCG tablet Take 1,000 mcg by mouth daily.   [DISCONTINUED] fluticasone (FLONASE) 50 MCG/ACT nasal spray    [DISCONTINUED] zolpidem (AMBIEN CR) 6.25 MG CR tablet Take 6.25 mg by mouth at bedtime as needed for sleep.   Social History   Tobacco Use   Smoking status: Never Smoker   Smokeless tobacco: Never Used  Substance Use Topics   Alcohol use: Yes    Comment: occasional wine    Family History  Problem Relation Age of Onset   Diabetes Mother    Arrhythmia Mother    High blood pressure Mother    Mesothelioma Father        asbestos exposure   Healthy Sister    Brain cancer Maternal Aunt    Post-traumatic stress disorder Son    Scoliosis Son      Review of Systems  Constitutional: Negative for activity change, appetite change, chills, fatigue, fever and unexpected weight change.  HENT: Positive for congestion. Negative for ear pain (fullness in ears), hearing loss, sinus pressure, sinus pain, sore throat and trouble swallowing.   Eyes: Negative for pain and visual disturbance.  Respiratory: Negative for cough, chest tightness, shortness of breath and wheezing.   Cardiovascular: Negative for chest pain, palpitations and leg swelling.  Gastrointestinal: Negative for abdominal pain, blood in stool, constipation, diarrhea, nausea and vomiting.  Genitourinary: Negative for difficulty urinating and menstrual problem.  Musculoskeletal: Positive for back pain. Negative for arthralgias.  Skin: Negative for rash.  Neurological: Negative for dizziness, weakness, numbness and headaches.  Hematological: Negative for adenopathy. Does not bruise/bleed easily.  Psychiatric/Behavioral: Positive for sleep disturbance. Negative for suicidal ideas. The patient is nervous/anxious.     CBC:  Lab Results  Component Value Date   WBC 6.3 03/24/2020   HGB 14.5 03/24/2020   HCT 42.8 03/24/2020   MCH 29.5 09/19/2019   MCHC 33.8 03/24/2020   RDW 12.9 03/24/2020   PLT 267.0 03/24/2020   MPV 10.4 09/19/2019   CMP: Lab Results  Component Value Date   NA 137 03/24/2020   NA 141 05/14/2018   K 4.1 03/24/2020   CL 102 03/24/2020   CO2 25 03/24/2020   GLUCOSE 123 (H) 03/24/2020   BUN 12 03/24/2020   BUN 17 05/14/2018   CREATININE 0.84 03/24/2020   CREATININE 0.91 09/19/2019   CALCIUM 10.1 03/24/2020   PROT 7.0 03/24/2020   BILITOT 0.5 03/24/2020   ALKPHOS 70  03/24/2020   ALT 13 03/24/2020   AST 17 03/24/2020   LIPID: Lab Results  Component Value Date   CHOL 133 03/24/2020   TRIG 41.0 03/24/2020   HDL 68.40 03/24/2020   LDLCALC 56 03/24/2020   LDLCALC 57 09/19/2019    Objective:  BP 102/82 (BP Location: Left Arm, Patient Position: Sitting, Cuff Size: Normal)    Pulse 98    Temp 98.1 F (36.7 C) (Oral)    Ht 5' 2.75" (1.594 m)    Wt 163 lb (73.9 kg)    SpO2 98%    BMI 29.10 kg/m   Weight: 163 lb (73.9 kg)   BP Readings from Last 3 Encounters:  03/24/20 102/82  10/15/19 110/70  09/19/19 102/70   Wt Readings from  Last 3 Encounters:  03/24/20 163 lb (73.9 kg)  03/23/20 160 lb (72.6 kg)  02/12/20 160 lb (72.6 kg)    Physical Exam Constitutional:      General: She is not in acute distress.    Appearance: She is well-developed and well-nourished.  HENT:     Head: Normocephalic and atraumatic.     Right Ear: Ear canal and external ear normal. There is impacted cerumen.     Left Ear: Ear canal and external ear normal. There is impacted cerumen.     Ears:     Comments: cerumenosis bilat    Nose:     Right Turbinates: Enlarged and swollen.     Left Turbinates: Enlarged and swollen.     Mouth/Throat:     Mouth: Oropharynx is clear and moist.     Pharynx: No oropharyngeal exudate.  Eyes:     Conjunctiva/sclera: Conjunctivae normal.     Pupils: Pupils are equal, round, and reactive to light.  Neck:     Thyroid: No thyromegaly.  Cardiovascular:     Rate and Rhythm: Normal rate and regular rhythm.     Heart sounds: Normal heart sounds. No murmur heard. No friction rub. No gallop.   Pulmonary:     Effort: Pulmonary effort is normal.     Breath sounds: Normal breath sounds.  Abdominal:     General: Bowel sounds are normal. There is no distension.     Palpations: Abdomen is soft. There is no mass.     Tenderness: There is no abdominal tenderness. There is no guarding.     Hernia: No hernia is present.  Musculoskeletal:         General: No tenderness, deformity or edema. Normal range of motion.     Cervical back: Normal range of motion and neck supple.  Lymphadenopathy:     Cervical: No cervical adenopathy.  Skin:    General: Skin is warm and dry.     Findings: No rash.  Neurological:     Mental Status: She is alert and oriented to person, place, and time.     Deep Tendon Reflexes: Strength normal. Reflexes normal.     Reflex Scores:      Tricep reflexes are 2+ on the right side and 2+ on the left side.      Bicep reflexes are 2+ on the right side and 2+ on the left side.      Brachioradialis reflexes are 2+ on the right side and 2+ on the left side.      Patellar reflexes are 2+ on the right side and 2+ on the left side.    Comments: Negative straight leg raise; mild bilat SI tenderness to palpation. There is mild pulling in lower back with flexion, but overall full ROM of back.  Psychiatric:        Mood and Affect: Mood is anxious.        Speech: Speech normal.        Behavior: Behavior normal.        Thought Content: Thought content normal.     Assessment/Plan: Health Maintenance Due  Topic Date Due   INFLUENZA VACCINE  08/31/2019   PNA vac Low Risk Adult (2 of 2 - PPSV23) 11/04/2019   COVID-19 Vaccine (3 - Booster for Moderna series) 01/05/2020   OPHTHALMOLOGY EXAM  02/26/2020   Health Maintenance reviewed.  1. Preventative health care She is up to date with preventative health. Repeat dexa 04/2021, repeat colonoscopy 05/2021.  2. Controlled type 2 diabetes mellitus with diabetic polyneuropathy, without long-term current use of insulin ([place]) Recheck bloodwork today. Diet controlled. Encouraged regular exercise. - HM DIABETES FOOT EXAM - Hemoglobin A1c - Microalbumin / creatinine urine ratio  3. Osteopenia of multiple sites Vitamin D supplementation; repeat dexa 04/2021. Weight bearing exercise.  4. Anxiety  stable. Has weaned self off lexapro.  5. Other insomnia Tries not to use  medication nightly; but doesn't sleep without it.  Discussed cognitive sleep treatment.  Rare use of Xanax or Ambien to help with sleep.  6. Subacute maxillary sinusitis Ongoing sx; but also recurrent. Sending to allergy for further evaluation/testing and ENT for future sinus sx control. - amoxicillin-clavulanate (AUGMENTIN) 875-125 MG tablet; Take 1 tablet by mouth 2 (two) times daily for 7 days.  Dispense: 14 tablet; Refill: 0 - Ambulatory referral to Allergy - Ambulatory referral to ENT  7. Acute midline low back pain, unspecified whether sciatica present Start with xray; consider further evaluation pending this result. - DG Lumbar Spine Complete; Future  8. Bilateral impacted cerumen Wax very dry; I feel irrigation and wax removal will be easier after softening drops at home. I have encouraged her to do this prior to seeing specialist.   9. Family history of hemochromatosis - IBC Panel([facility]); Future - CBC with Differential/Platelet - Hemochromatosis DNA-PCR(c282y,h63d) - Ferritin - IBC Panel([facility])  10. Hyperlipidemia, unspecified hyperlipidemia type crestor 10mg  daily; recheck today - Comprehensive metabolic panel - Lipid panel  11. B12 deficiency Taking 1042mcg daily - Vitamin B12  12. Vitamin D deficiency *taking 2000 units daily - VITAMIN D 25 Hydroxy (Vit-D Deficiency, Fractures)  Return for nurse visit immunizations - pneumo23, flu shot.  Micheline Rough, MD

## 2020-03-24 NOTE — Addendum Note (Signed)
Addended by: Tessie Fass D on: 03/24/2020 09:03 AM   Modules accepted: Orders

## 2020-03-28 ENCOUNTER — Encounter: Payer: Self-pay | Admitting: Family Medicine

## 2020-03-30 ENCOUNTER — Telehealth (INDEPENDENT_AMBULATORY_CARE_PROVIDER_SITE_OTHER): Payer: Medicare Other | Admitting: Family Medicine

## 2020-03-30 ENCOUNTER — Encounter: Payer: Self-pay | Admitting: Family Medicine

## 2020-03-30 DIAGNOSIS — U071 COVID-19: Secondary | ICD-10-CM | POA: Diagnosis not present

## 2020-03-30 NOTE — Progress Notes (Signed)
Virtual Visit via Telephone Note  I connected with Renee Bauer on 03/30/20 at 12:20 PM EST by telephone and verified that I am speaking with the correct person using two identifiers.   I discussed the limitations, risks, security and privacy concerns of performing an evaluation and management service by telephone and the availability of in person appointments. I also discussed with the patient that there may be a patient responsible charge related to this service. The patient expressed understanding and agreed to proceed.  Location patient: home, [place] Location provider: work or home office Participants present for the call: patient, provider Patient did not have a visit with me in the prior 7 days to address this/these issue(s).   History of Present Illness:  Acute telemedicine visit for COVID19: -Onset: had resp infection in January with prolonged symptoms, then got sick again starting 2/22-23/22; tested positive for covid today -Symptoms include: headache, nasal congestion, loss of taste, some body aches -Denies: fever, sob, CP, NVD, cough, severe worst headache, inability to eat/drink/get out of bed -Pertinent past medical history: DM -Pertinent medication allergies:clinda, wellbutrin -COVID-19 vaccine status: had 2 doses of moderna   Observations/Objective: Patient sounds cheerful and well on the phone. I do not appreciate any SOB. Speech and thought processing are grossly intact. Patient reported vitals:  Assessment and Plan:  COVID-19  -we discussed possible serious and likely etiologies, options for evaluation and workup, limitations of telemedicine visit vs in person visit, treatment, treatment risks and precautions. Pt prefers to treat via telemedicine empirically rather than in person at this moment.  Discussed treatment options, ideal treatment window, potential complications, isolation and precautions for COVID-19.  She declined referral for Covid outpatient treatment at  this time given duration of symptoms and no longer candidate for oral meds.  Opted for nasal saline, short course nasal decongestant and other care measures summarized in patient instructions.  Other symptomatic care measures summarized in patient instructions.    Work/School slipped offered:declined Scheduled follow up with PCP offered: She agrees to schedule follow-up later this week or early next week; sent message to schedulers to assist and advised patient to contact PCP office to schedule if does not receive call back in next 24 hours. Advised to seek prompt in person care if worsening, new symptoms arise, or if is not improving with treatment. Advised of options for inperson care in case PCP office not available. Did let the patient know that I only do telemedicine shifts for [facility] on Tuesdays and Thursdays and advised a follow up visit with PCP or at an [facility] if has further questions or concerns.   Follow Up Instructions:  I did not refer this patient for an OV with me in the next 24 hours for this/these issue(s).  I discussed the assessment and treatment plan with the patient. The patient was provided an opportunity to ask questions and all were answered. The patient agreed with the plan and demonstrated an understanding of the instructions.   I spent 18 minutes on the date of this visit in the care of this patient. See summary of tasks completed to properly care for this patient in the detailed notes above which also included counseling of above, review of PMH, medications, allergies, evaluation of the patient and ordering and/or  instructing patient on testing and care options.     Renee Kern, DO

## 2020-03-30 NOTE — Patient Instructions (Signed)
  HOME CARE TIPS:  -can use tylenol or aleve if needed for fevers, aches and pains per instructions  -can use nasal saline a few times per day if you have nasal congestion; sometimes  a short course of Afrin nasal spray for 3 days can help with symptoms as well  -stay hydrated, drink plenty of fluids and eat small healthy meals - avoid dairy  -can take 1000 IU (25mcg) Vit D3 and 100-500 mg of Vit C daily per instructions  -follow up with your doctor in 2-3 days unless improving and feeling better  -stay home for a full 10 days since the onset of symptoms PLUS one day of no fever and feeling better. Wear a good mask (such as N95 or KN95) if around others to reduce the risk of transmission.  It was nice to meet you today, and I really hope you are feeling better soon. I help [facility] out with telemedicine visits on Tuesdays and Thursdays and am available for visits on those days. If you have any concerns or questions following this visit please schedule a follow up visit with your Primary Care doctor or seek care at a local urgent care clinic to avoid delays in care.    Seek in person care or schedule a follow up video visit promptly if your symptoms worsen, new concerns arise or you are not improving with treatment. Call 911 and/or seek emergency care if your symptoms are severe or life threatening.

## 2020-03-31 ENCOUNTER — Encounter: Payer: Self-pay | Admitting: Family Medicine

## 2020-04-01 LAB — HEMOCHROMATOSIS DNA-PCR(C282Y,H63D)

## 2020-04-05 ENCOUNTER — Encounter: Payer: Self-pay | Admitting: Family Medicine

## 2020-04-06 NOTE — Progress Notes (Signed)
Please see how she is feeling now? Happy to add her in if needed or talk through symptom treatment if needed.

## 2020-04-07 ENCOUNTER — Telehealth: Payer: Self-pay | Admitting: *Deleted

## 2020-04-07 NOTE — Telephone Encounter (Signed)
Noted.  Great!

## 2020-04-07 NOTE — Telephone Encounter (Signed)
-----   Message from Caren Macadam, MD sent at 04/06/2020 10:21 PM EST -----   ----- Message ----- From: Beverlee Nims Sent: 03/30/2020   2:22 PM EST To: Caren Macadam, MD  Can we work this patient in to see Dr. Ethlyn Gallery per Dr. Maudie Mercury?  ----- Message ----- From: [name], DO Sent: 03/30/2020   1:40 PM EST To: [facility] Admin Pool  Please assist with virtual follow-up for COVID-19 this Friday or coming Monday.  Thanks!

## 2020-04-07 NOTE — Telephone Encounter (Signed)
Spoke with the pt and she stated she is feeling better.  Patient stated she still has a cough and lack of taste and smell.  Also stated she has an appt with an ENT next week for an evaluation and agreed to call back for a virtual visit here if needed.  Message sent to PCP.

## 2020-04-12 ENCOUNTER — Encounter (INDEPENDENT_AMBULATORY_CARE_PROVIDER_SITE_OTHER): Payer: Self-pay | Admitting: Otolaryngology

## 2020-04-12 ENCOUNTER — Other Ambulatory Visit: Payer: Self-pay

## 2020-04-12 ENCOUNTER — Ambulatory Visit (INDEPENDENT_AMBULATORY_CARE_PROVIDER_SITE_OTHER): Payer: Medicare Other | Admitting: Otolaryngology

## 2020-04-12 VITALS — Temp 97.7°F

## 2020-04-12 DIAGNOSIS — J310 Chronic rhinitis: Secondary | ICD-10-CM

## 2020-04-12 NOTE — Progress Notes (Signed)
HPI: Renee Bauer is a 70 y.o. female who presents is referred by her PCP for evaluation of nasal sinus issues.  She just moved here from [place] state little over a year ago.  She used to see an ENT doctor where she moved from.  She has been diagnosed with chronic rhinitis and uses Flonase and loratadine on a regular basis.  She is also had problems with wax buildup in her ears but has used Debrox to help clean this.  She has not noted any significant change in her hearing although she does note that her hearing is a little diminished. She recently had Covid where she lost her sense of smell last month but this is getting better..  Past Medical History:  Diagnosis Date  . Anxiety   . Arthritis   . Benign colon polyp 2018  . Bursitis   . Depression   . Diabetes mellitus without complication ([place])   . GERD (gastroesophageal reflux disease)   . Hyperlipidemia   . Rosacea    Past Surgical History:  Procedure Laterality Date  . ABDOMINAL HYSTERECTOMY     menorrhagia  . BLADDER REPAIR    . BREAST BIOPSY Left   . BUNIONECTOMY Right   . COLON RESECTION  2007  . DILATION AND CURETTAGE OF UTERUS     Social History   Socioeconomic History  . Marital status: Married    Spouse name: Not on file  . Number of children: Not on file  . Years of education: Not on file  . Highest education level: Not on file  Occupational History  . Not on file  Tobacco Use  . Smoking status: Never Smoker  . Smokeless tobacco: Never Used  Vaping Use  . Vaping Use: Never used  Substance and Sexual Activity  . Alcohol use: Yes    Comment: occasional wine  . Drug use: Never  . Sexual activity: Not on file  Other Topics Concern  . Not on file  Social History Narrative  . Not on file   Social Determinants of Health   Financial Resource Strain: Not on file  Food Insecurity: Not on file  Transportation Needs: Not on file  Physical Activity: Not on file  Stress: Not on file  Social Connections:  Not on file   Family History  Problem Relation Age of Onset  . Diabetes Mother   . Arrhythmia Mother   . High blood pressure Mother   . Mesothelioma Father        asbestos exposure  . Healthy Sister   . Brain cancer Maternal Aunt   . Post-traumatic stress disorder Son   . Scoliosis Son    Allergies  Allergen Reactions  . Wellbutrin [Bupropion]     Muscle twitches  . Clindamycin Hcl Rash   Prior to Admission medications   Medication Sig Start Date End Date Taking? Authorizing Provider  ALPRAZolam Duanne Moron) 1 MG tablet Take 1 tablet (1 mg total) by mouth as needed for anxiety. 09/19/19   Caren Macadam, MD  Azelastine & Fluticasone 137 & 50 MCG/ACT THPK Place 2 sprays into the nose in the morning and at bedtime. 03/24/20   Caren Macadam, MD  betamethasone acetate-betamethasone sodium phosphate (CELESTONE) 6 (3-3) MG/ML injection Inject into the articular space. 01/13/20   [provider]  CALCIUM CITRATE PO Take 600 mg by mouth daily.    [provider]  Cholecalciferol (VITAMIN D3 PO) Take 2,000 Units by mouth daily.  [provider]  esomeprazole (NEXIUM) 40 MG capsule Take 1 capsule (40 mg total) by mouth daily. 01/05/20   Caren Macadam, MD  gabapentin (NEURONTIN) 100 MG capsule TAKE 1 CAPSULE THREE TIMES A DAY 08/19/19   Felipa Furnace, DPM  Ginger, Zingiber officinalis, (GINGER ROOT PO) Take by mouth.    [provider]  lidocaine (XYLOCAINE) 1 % (with preservative) injection by Infiltration route. 01/13/20   [provider]  loratadine (CLARITIN) 10 MG tablet Take 10 mg by mouth daily.    [provider]  Melatonin 10 MG TABS Take by mouth at bedtime.    [provider]  meloxicam (MOBIC) 7.5 MG tablet Take 1 tablet (7.5 mg total) by mouth 2 (two) times daily as needed for pain. 11/18/19   Koberlein, Steele Berg, MD  METRONIDAZOLE, TOPICAL, 0.75 % LOTN as needed.  06/30/18   [provider]  OVER  THE COUNTER MEDICATION Zeasorb powder    [provider]  rosuvastatin (CRESTOR) 10 MG tablet TAKE 1 TABLET AT BEDTIME 11/03/19   Koberlein, Junell C, MD  TART CHERRY PO Take 1,000 mg by mouth.    [provider]  TERBINAFINE EX Apply topically.    [provider]  TURMERIC CURCUMIN PO Take 500 mg by mouth daily.    [provider]  vitamin B-12 (CYANOCOBALAMIN) 1000 MCG tablet Take 1,000 mcg by mouth daily.    [provider]  zolpidem (AMBIEN CR) 6.25 MG CR tablet Take 1 tablet (6.25 mg total) by mouth at bedtime as needed for sleep. 03/24/20   Caren Macadam, MD     Positive ROS: Otherwise negative  All other systems have been reviewed and were otherwise negative with the exception of those mentioned in the HPI and as above.  Physical Exam: Constitutional: Alert, well-appearing, no acute distress Ears: External ears without lesions or tenderness. Ear canals are clear bilaterally.  Both TMs are clear bilaterally.  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 Nasal: External nose without lesions. Septum relatively midline.  Both middle meatus regions are clear..  On nasal endoscopy again both middle meatus and ethmoid regions are clear.  Maxillary ostia are patent without drainage.  The nasopharynx is clear with just clear mucus discharge and no signs of infection.  No polyps noted. Oral: Lips and gums without lesions. Tongue and palate mucosa without lesions. Posterior oropharynx clear. Neck: No palpable adenopathy or masses Respiratory: Breathing comfortably  Skin: No facial/neck lesions or rash noted.  Procedures  Assessment: Chronic rhinitis. History of wax buildup with minimal wax today having used Debrox. Mild upper frequency hearing loss.  Plan: Would recommend regular use of the Flonase 2 sprays each nostril at night  and gave her a prescription for refill as needed.  Also discussed with her concerning using saline nasal irrigation for nasal discharge during the day.   Radene Journey, MD   CC:

## 2020-04-15 IMAGING — MR MR ANKLE*L* W/O CM
5 series · 40 of 40 positions shown · non-contrast
Comparison: None.

CLINICAL DATA: Medial left ankle pain.

EXAM:
MRI OF THE LEFT ANKLE WITHOUT CONTRAST
TECHNIQUE: Multiplanar, multisequence MR imaging of the ankle was performed. No
intravenous contrast was administered.

[Series 3: T2 fat-sat · axial · 3.0mm · 0.50mm/px · z∈[-85,+54]mm · 10 of 36 slices shown (1 of 2)]
[im 1/36]
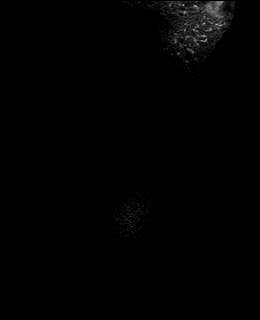
[im 4/36]
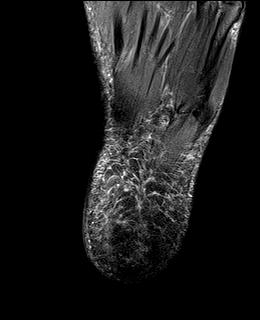
[im 8/36]
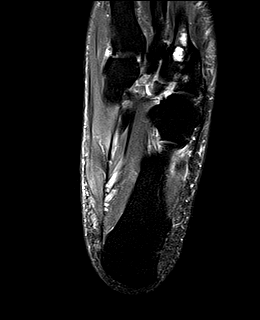
[im 12/36]
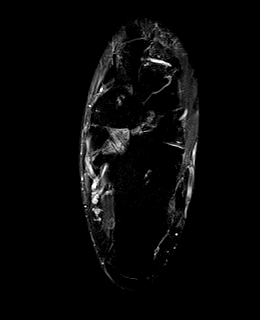
[im 16/36]
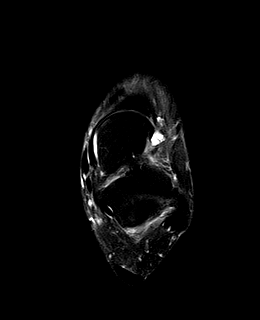
[im 20/36]
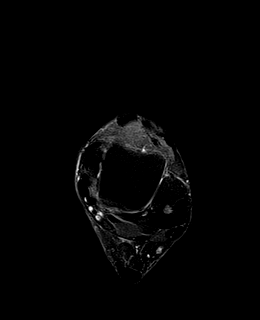
[im 24/36]
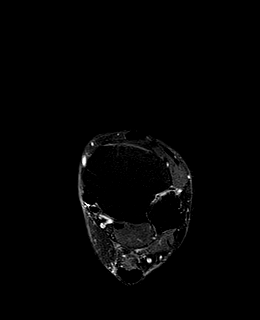
[im 28/36]
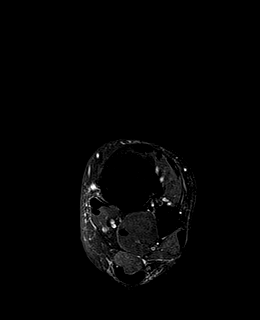
[im 32/36]
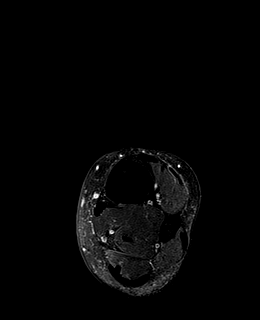
[im 36/36]
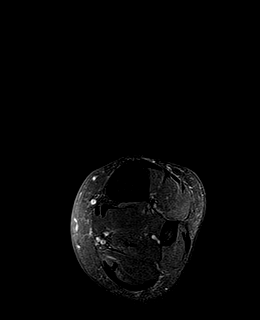

[Series 4: PD fat-sat · axial · 3.5mm · 0.56mm/px · z∈[-84,+52]mm · 8 of 32 slices shown]
[im 1/32]
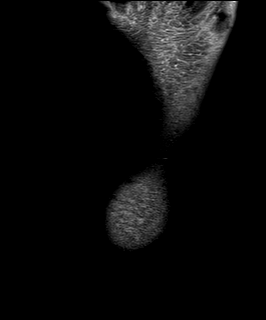
[im 5/32]
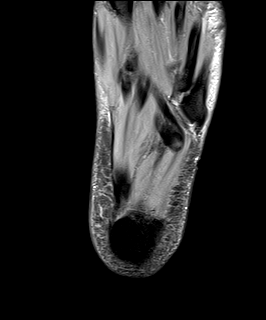
[im 9/32]
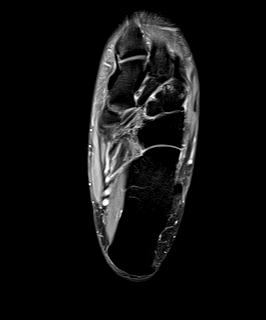
[im 14/32]
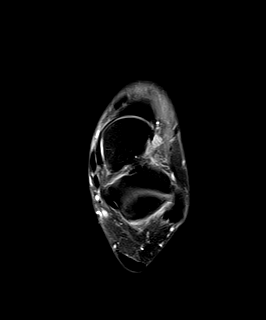
[im 18/32]
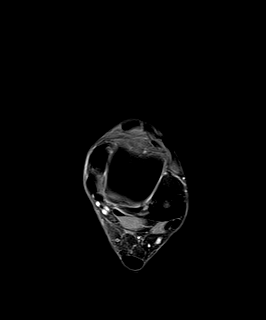
[im 23/32]
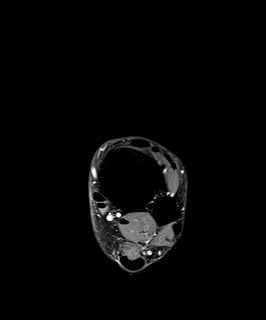
[im 27/32]
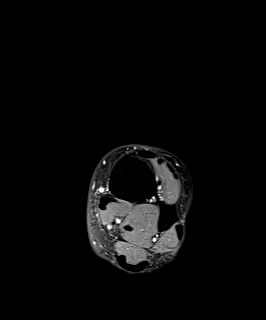
[im 32/32]
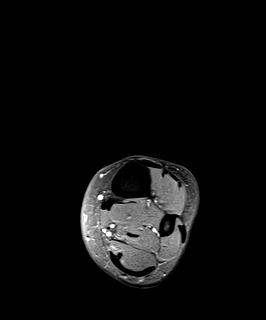

[Series 5: T2 fat-sat · coronal · 3.0mm · 0.62mm/px · 10 of 39 slices shown (2 of 2)]
[im 1/39]
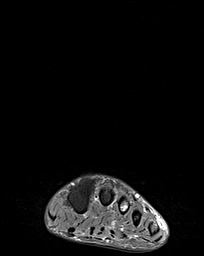
[im 5/39]
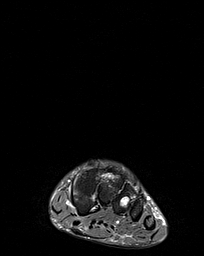
[im 9/39]
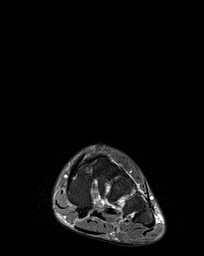
[im 13/39]
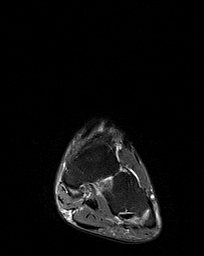
[im 17/39]
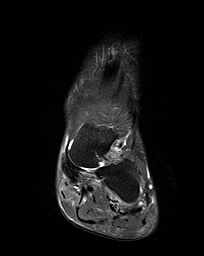
[im 22/39]
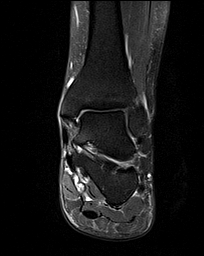
[im 26/39]
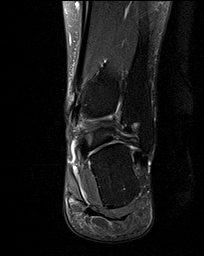
[im 30/39]
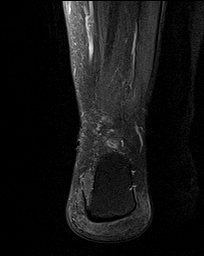
[im 34/39]
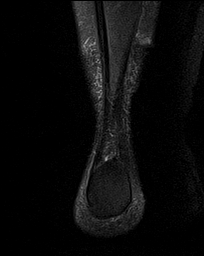
[im 39/39]
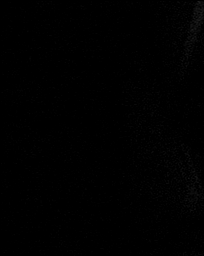

[Series 6: T1 · sagittal · 4.0mm · 0.56mm/px · 6 of 22 slices shown]
[im 1/22]
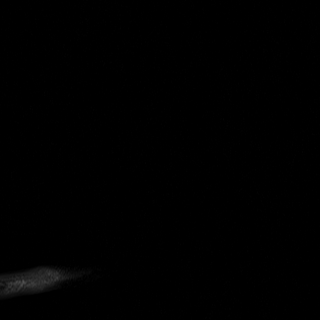
[im 5/22]
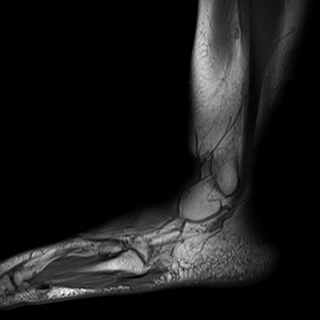
[im 9/22]
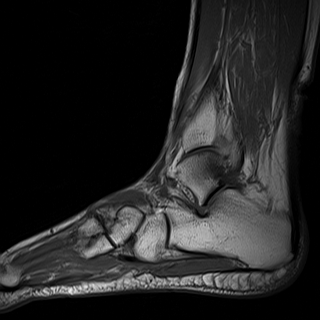
[im 13/22]
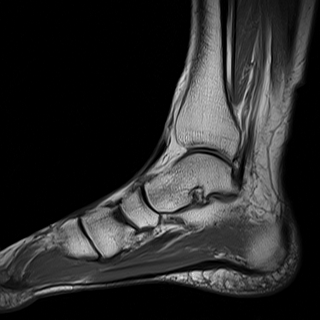
[im 17/22]
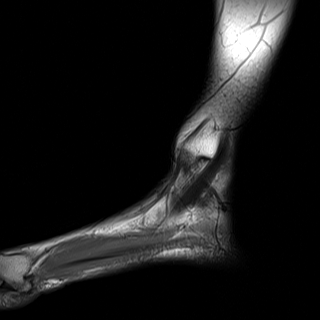
[im 22/22]
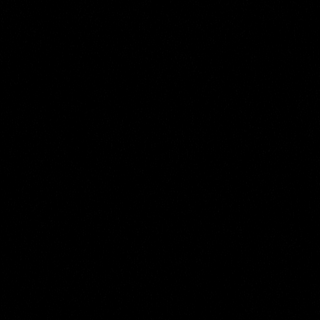

[Series 7: STIR · sagittal · 4.0mm · 0.70mm/px · 6 of 22 slices shown]
[im 1/22]
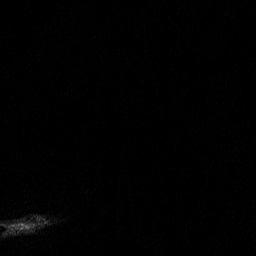
[im 5/22]
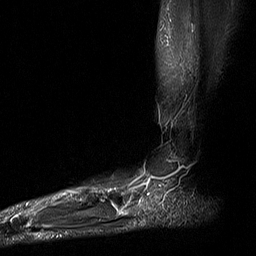
[im 9/22]
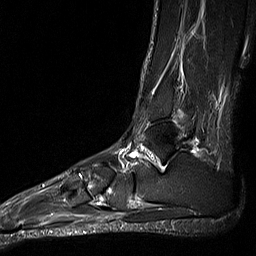
[im 13/22]
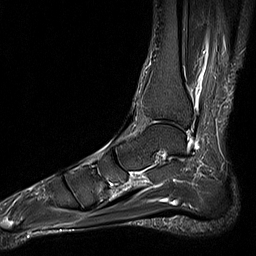
[im 17/22]
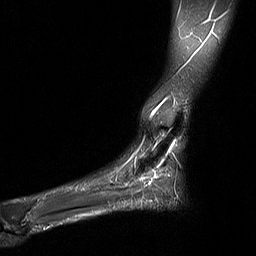
[im 22/22]
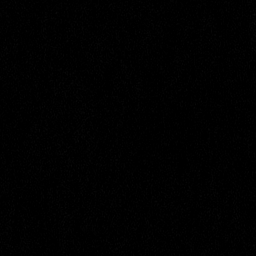

[40 of 40 positions shown; findings below may reference images not displayed]

FINDINGS: TENDONS

Peroneal: Peroneal longus tendon intact. Peroneal brevis intact.

Posteromedial: Posterior tibial tendon intact. Flexor hallucis
longus tendon intact. Flexor digitorum longus tendon intact.

Anterior: Tibialis anterior tendon intact. Extensor hallucis longus
tendon intact Extensor digitorum longus tendon intact.

Achilles:  Intact.

Plantar Fascia: Intact. 10.7 x 4.6 x 11.1 mm low signal mass along
the medial band of the plantar fascia at the level of the calcaneus
most consistent with a plantar fibroma.

LIGAMENTS

Lateral: Anterior talofibular ligament intact. Calcaneofibular
ligament intact. Posterior talofibular ligament intact. Anterior and
posterior tibiofibular ligaments intact.

Medial: Deltoid ligament intact. Spring ligament intact.

CARTILAGE

Ankle Joint: No joint effusion. Normal ankle mortise. No chondral
defect.

Subtalar Joints/Sinus Tarsi: Normal subtalar joints. No subtalar
joint effusion. Normal sinus tarsi.

Bones: No marrow signal abnormality.  No fracture or dislocation.

Soft Tissue: No fluid collection or hematoma.  Muscles are normal.
IMPRESSION: 10.7 x 4.6 x 1.1 mm plantar fibroma involving the medial band of the
plantar fascia at the level of the calcaneus.

## 2020-04-20 ENCOUNTER — Telehealth: Payer: Self-pay | Admitting: Orthopaedic Surgery

## 2020-04-20 ENCOUNTER — Telehealth: Payer: Self-pay

## 2020-04-20 NOTE — Telephone Encounter (Signed)
OK to pre cert

## 2020-04-20 NOTE — Telephone Encounter (Signed)
error 

## 2020-04-20 NOTE — Telephone Encounter (Signed)
Patient called advised she would like to get the gel injection again in her left knee. The number to contact patient is [phone]

## 2020-04-20 NOTE — Telephone Encounter (Signed)
Please advise 

## 2020-04-20 NOTE — Telephone Encounter (Signed)
Please precert for left knee gel injections. This is Dr.Whitfield's patient. Thanks! 

## 2020-04-21 NOTE — Telephone Encounter (Signed)
Noted  

## 2020-04-29 ENCOUNTER — Other Ambulatory Visit: Payer: Self-pay | Admitting: Family Medicine

## 2020-04-29 DIAGNOSIS — E1169 Type 2 diabetes mellitus with other specified complication: Secondary | ICD-10-CM

## 2020-04-29 DIAGNOSIS — E785 Hyperlipidemia, unspecified: Secondary | ICD-10-CM

## 2020-05-11 ENCOUNTER — Other Ambulatory Visit: Payer: Self-pay

## 2020-05-11 ENCOUNTER — Ambulatory Visit (INDEPENDENT_AMBULATORY_CARE_PROVIDER_SITE_OTHER): Payer: Medicare Other | Admitting: Allergy and Immunology

## 2020-05-11 ENCOUNTER — Encounter: Payer: Self-pay | Admitting: Allergy and Immunology

## 2020-05-11 VITALS — BP 118/80 | HR 82 | Temp 97.8°F | Resp 16 | Ht 61.0 in | Wt 168.0 lb

## 2020-05-11 DIAGNOSIS — J3089 Other allergic rhinitis: Secondary | ICD-10-CM

## 2020-05-11 NOTE — Patient Instructions (Addendum)
  1.  Allergen avoidance measures - dust mite  2.  Treat and prevent inflammation:   A. Dymista (fluticasone + azelastine) - 1 spray each nostril 2 times per day  B. Montelukast 10 mg - 1 tablet 1 time per day  C. Prednisone 10 mg - 1 tablet 1 time per day for 10 days only  3. If needed:   A. Nasal saline  B. OTC antihistamine  4. Immunotherapy???  5. Contact clinic with update in 4 weeks

## 2020-05-11 NOTE — Progress Notes (Signed)
Renee Bauer - [place] [facility] [place] [facility] [place] [facility] [place]   Dear Dr. Ethlyn Gallery,  Thank you for referring Renee Bauer to the [facility] on 05/11/2020.   Below is a summation of this patient's evaluation and recommendations.  Thank you for your referral. I will keep you informed about this patient's response to treatment.   If you have any questions please do not hesitate to contact me.   Sincerely,  Jiles Prows, MD Allergy / Immunology [facility]   ______________________________________________________________________    NEW PATIENT NOTE  Referring Provider: Caren Macadam, MD Primary Provider: Caren Macadam, MD Date of office visit: 05/11/2020    Subjective:   Chief Complaint:  Renee Bauer (DOB: 1950-02-28) is a 70 y.o. female who presents to the clinic on 05/11/2020 with a chief complaint of Sinusitis .     HPI: [name] presents to this clinic in evaluation of persistent upper airway symptoms.  She has a decade or longer history of nasal congestion and sneezing with very little rhinorrhea and no other significant respiratory tract symptoms without any obvious trigger and without any obvious seasonality that has not responded to consistent use of nasal steroids and antihistamines.  She has had evaluation with ENT recently who documented the absence of any nasal polyposis or significant anatomical abnormality.  She moved from [place] about 2 years ago to this area and has not really noticed any difference regarding her nose with this change of climate and area.  She has no other associated atopic symptoms involving other organ systems.  Recently she contracted COVID in mid February 2022 with an upper respiratory tract infection and anosmia and was treated with antibiotics.  She resolved that issue although there is still a diminution in her ability to taste  compared to prior to Covid.  Past Medical History:  Diagnosis Date  . Anxiety   . Arthritis   . Benign colon polyp 2018  . Bursitis   . Depression   . Diabetes mellitus without complication ([place])   . GERD (gastroesophageal reflux disease)   . Hyperlipidemia   . Rosacea     Past Surgical History:  Procedure Laterality Date  . ABDOMINAL HYSTERECTOMY     menorrhagia  . BLADDER REPAIR    . BREAST BIOPSY Left   . BUNIONECTOMY Right   . COLON RESECTION  2007  . DILATION AND CURETTAGE OF UTERUS      Allergies as of 05/11/2020      Reactions   Wellbutrin [bupropion]    Muscle twitches   Clindamycin Hcl Rash      Medication List      ALPRAZolam 1 MG tablet Commonly known as: XANAX Take 1 tablet (1 mg total) by mouth as needed for anxiety.   Azelastine & Fluticasone 137 & 50 MCG/ACT Thpk Place 2 sprays into the nose in the morning and at bedtime.   betamethasone acetate-betamethasone sodium phosphate 6 (3-3) MG/ML injection Commonly known as: CELESTONE Inject into the articular space.   CALCIUM CITRATE PO Take 600 mg by mouth daily.   esomeprazole 40 MG capsule Commonly known as: NexIUM Take 1 capsule (40 mg total) by mouth daily.   gabapentin 100 MG capsule Commonly known as: NEURONTIN TAKE 1 CAPSULE THREE TIMES A DAY   GINGER ROOT PO Take by mouth.   lidocaine 1 % (with preservative) injection Commonly known as: XYLOCAINE by Infiltration  route.   loratadine 10 MG tablet Commonly known as: CLARITIN Take 10 mg by mouth daily.   Melatonin 10 MG Tabs Take by mouth at bedtime.   meloxicam 7.5 MG tablet Commonly known as: MOBIC Take 1 tablet (7.5 mg total) by mouth 2 (two) times daily as needed for pain.   METRONIDAZOLE (TOPICAL) 0.75 % Lotn as needed.   OVER THE COUNTER MEDICATION Zeasorb powder   rosuvastatin 10 MG tablet Commonly known as: CRESTOR TAKE 1 TABLET AT BEDTIME   TART CHERRY PO Take 1,000 mg by mouth.   TERBINAFINE EX Apply  topically.   TURMERIC CURCUMIN PO Take 500 mg by mouth daily.   vitamin B-12 1000 MCG tablet Commonly known as: CYANOCOBALAMIN Take 1,000 mcg by mouth daily.   VITAMIN D3 PO Take 2,000 Units by mouth daily.   zolpidem 6.25 MG CR tablet Commonly known as: AMBIEN CR Take 1 tablet (6.25 mg total) by mouth at bedtime as needed for sleep.       Review of systems negative except as noted in HPI / PMHx or noted below:  Review of Systems  Constitutional: Negative.   HENT: Negative.   Eyes: Negative.   Respiratory: Negative.   Cardiovascular: Negative.   Gastrointestinal: Negative.   Genitourinary: Negative.   Musculoskeletal: Negative.   Skin: Negative.   Neurological: Negative.   Endo/Heme/Allergies: Negative.   Psychiatric/Behavioral: Negative.     Family History  Problem Relation Age of Onset  . Diabetes Mother   . Arrhythmia Mother   . High blood pressure Mother   . Mesothelioma Father        asbestos exposure  . Allergic rhinitis Father   . Sinusitis Father   . Healthy Sister   . Brain cancer Maternal Aunt   . Post-traumatic stress disorder Son   . Scoliosis Son   . Asthma Neg Hx   . Eczema Neg Hx   . Urticaria Neg Hx   . Angioedema Neg Hx   . Immunodeficiency Neg Hx   . Atopy Neg Hx     Social History   Socioeconomic History  . Marital status: Married    Spouse name: Not on file  . Number of children: Not on file  . Years of education: Not on file  . Highest education level: Not on file  Occupational History  . Not on file  Tobacco Use  . Smoking status: Never Smoker  . Smokeless tobacco: Never Used  Vaping Use  . Vaping Use: Never used  Substance and Sexual Activity  . Alcohol use: Yes    Comment: occasional wine  . Drug use: Never  . Sexual activity: Not on file  Other Topics Concern  . Not on file  Social History Narrative  . Not on file   Environmental and Social history  Lives in a new house with a dry environment, no animals  located inside the household, no carpet in the bedroom, plastic on the bed, no plastic on the pillow, no smoking ongoing with inside the household.  Objective:   Vitals:   05/11/20 0936  BP: 118/80  Pulse: 82  Resp: 16  Temp: 97.8 F (36.6 C)  SpO2: 95%   Height: 5\' 1"  (154.9 cm) Weight: 168 lb (76.2 kg)  Physical Exam Constitutional:      Appearance: She is not diaphoretic.  HENT:     Head: Normocephalic.     Right Ear: Ear canal and external ear normal. There is impacted cerumen.  Left Ear: Ear canal and external ear normal. There is impacted cerumen.     Nose: Nose normal. No mucosal edema or rhinorrhea.     Mouth/Throat:     Pharynx: Uvula midline. No oropharyngeal exudate.  Eyes:     Conjunctiva/sclera: Conjunctivae normal.  Neck:     Thyroid: No thyromegaly.     Trachea: Trachea normal. No tracheal tenderness or tracheal deviation.  Cardiovascular:     Rate and Rhythm: Normal rate and regular rhythm.     Heart sounds: Normal heart sounds, S1 normal and S2 normal. No murmur heard.   Pulmonary:     Effort: No respiratory distress.     Breath sounds: Normal breath sounds. No stridor. No wheezing or rales.  Lymphadenopathy:     Head:     Right side of head: No tonsillar adenopathy.     Left side of head: No tonsillar adenopathy.     Cervical: No cervical adenopathy.  Skin:    Findings: No erythema or rash.     Nails: There is no clubbing.  Neurological:     Mental Status: She is alert.     Diagnostics: Allergy skin tests were performed.  She demonstrated hypersensitivity to house dust mite.  Results of blood tests obtained 24 March 2020 identified WBC 6.3, absolute eosinophils 0, absolute lymphocyte 900, hemoglobin 14.5, platelet 267   Assessment and Plan:    1. Perennial allergic rhinitis     1.  Allergen avoidance measures - dust mite  2.  Treat and prevent inflammation:   A. Dymista (fluticasone + azelastine) - 1 spray each nostril 2 times  per day  B. Montelukast 10 mg - 1 tablet 1 time per day  C. Prednisone 10 mg - 1 tablet 1 time per day for 10 days only  3. If needed:   A. Nasal saline  B. OTC antihistamine  4. Immunotherapy???  5. 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  If she fails medical therapy she would definitely be a candidate for immunotherapy.  She will contact me in 4 weeks with an update regarding her response to this plan.  Jiles Prows, MD Allergy / Immunology [facility]

## 2020-05-12 ENCOUNTER — Encounter: Payer: Self-pay | Admitting: Allergy and Immunology

## 2020-05-12 MED ORDER — AZELASTINE & FLUTICASONE 137 & 50 MCG/ACT NA THPK: 1.0000 | PACK | Freq: Two times a day (BID) | NASAL | 5 refills | Status: DC

## 2020-05-12 MED ORDER — MONTELUKAST SODIUM 10 MG PO TABS: 10.0000 mg | ORAL_TABLET | Freq: Every day | ORAL | 5 refills | Status: DC

## 2020-05-17 ENCOUNTER — Telehealth: Payer: Self-pay | Admitting: Family Medicine

## 2020-05-17 NOTE — Telephone Encounter (Signed)
Left message for patient to call back and schedule Medicare Annual Wellness Visit (AWV) either virtually or in office.   AWV-I PER [facility] AS OF 05/31/19  please schedule at anytime with [facility] Nurse Health Advisor 1 or 2   This should be a 45 minute visit.

## 2020-05-21 ENCOUNTER — Telehealth: Payer: Self-pay

## 2020-05-21 NOTE — Telephone Encounter (Signed)
VOB has been submitted for Synvisc, left knee. Pending BV. 

## 2020-05-26 ENCOUNTER — Ambulatory Visit: Payer: Medicare Other | Admitting: Podiatry

## 2020-06-04 ENCOUNTER — Ambulatory Visit (INDEPENDENT_AMBULATORY_CARE_PROVIDER_SITE_OTHER): Payer: Medicare Other

## 2020-06-04 ENCOUNTER — Other Ambulatory Visit: Payer: Self-pay

## 2020-06-04 VITALS — BP 122/68 | HR 67 | Temp 98.1°F | Ht 61.0 in | Wt 166.0 lb

## 2020-06-04 DIAGNOSIS — Z0000 Encounter for general adult medical examination without abnormal findings: Secondary | ICD-10-CM

## 2020-06-04 NOTE — Progress Notes (Signed)
Subjective:   Renee Bauer is a 70 y.o. female who presents for an Initial Medicare Annual Wellness Visit.  Review of Systems    n/a       Objective:    There were no vitals filed for this visit. There is no height or weight on file to calculate BMI.  No flowsheet data found.  Current Medications (verified) Outpatient Encounter Medications as of 06/04/2020  Medication Sig  . ALPRAZolam (XANAX) 1 MG tablet Take 1 tablet (1 mg total) by mouth as needed for anxiety.  . Azelastine & Fluticasone 137 & 50 MCG/ACT THPK Place 1 spray into the nose in the morning and at bedtime.  . betamethasone acetate-betamethasone sodium phosphate (CELESTONE) 6 (3-3) MG/ML injection Inject into the articular space.  Marland Kitchen CALCIUM CITRATE PO Take 600 mg by mouth daily.  . Cholecalciferol (VITAMIN D3 PO) Take 2,000 Units by mouth daily.  Marland Kitchen esomeprazole (NEXIUM) 40 MG capsule Take 1 capsule (40 mg total) by mouth daily.  Marland Kitchen gabapentin (NEURONTIN) 100 MG capsule TAKE 1 CAPSULE THREE TIMES A DAY  . Ginger, Zingiber officinalis, (GINGER ROOT PO) Take by mouth.  . lidocaine (XYLOCAINE) 1 % (with preservative) injection by Infiltration route.  . loratadine (CLARITIN) 10 MG tablet Take 10 mg by mouth daily.  . Melatonin 10 MG TABS Take by mouth at bedtime.  . meloxicam (MOBIC) 7.5 MG tablet Take 1 tablet (7.5 mg total) by mouth 2 (two) times daily as needed for pain.  Marland Kitchen METRONIDAZOLE, TOPICAL, 0.75 % LOTN as needed.   . montelukast (SINGULAIR) 10 MG tablet Take 1 tablet (10 mg total) by mouth at bedtime.  Marland Kitchen OVER THE COUNTER MEDICATION Zeasorb powder  . rosuvastatin (CRESTOR) 10 MG tablet TAKE 1 TABLET AT BEDTIME  . TART CHERRY PO Take 1,000 mg by mouth.  . TERBINAFINE EX Apply topically.  . TURMERIC CURCUMIN PO Take 500 mg by mouth daily.  . vitamin B-12 (CYANOCOBALAMIN) 1000 MCG tablet Take 1,000 mcg by mouth daily.  Marland Kitchen zolpidem (AMBIEN CR) 6.25 MG CR tablet Take 1 tablet (6.25 mg total) by mouth at bedtime as  needed for sleep.   No facility-administered encounter medications on file as of 06/04/2020.    Allergies (verified) Wellbutrin [bupropion] and Clindamycin hcl   History: Past Medical History:  Diagnosis Date  . Anxiety   . Arthritis   . Benign colon polyp 2018  . Bursitis   . Depression   . Diabetes mellitus without complication ([place])   . GERD (gastroesophageal reflux disease)   . Hyperlipidemia   . Rosacea    Past Surgical History:  Procedure Laterality Date  . ABDOMINAL HYSTERECTOMY     menorrhagia  . BLADDER REPAIR    . BREAST BIOPSY Left   . BUNIONECTOMY Right   . COLON RESECTION  2007  . DILATION AND CURETTAGE OF UTERUS     Family History  Problem Relation Age of Onset  . Diabetes Mother   . Arrhythmia Mother   . High blood pressure Mother   . Mesothelioma Father        asbestos exposure  . Allergic rhinitis Father   . Sinusitis Father   . Healthy Sister   . Brain cancer Maternal Aunt   . Post-traumatic stress disorder Son   . Scoliosis Son   . Asthma Neg Hx   . Eczema Neg Hx   . Urticaria Neg Hx   . Angioedema Neg Hx   . Immunodeficiency Neg Hx   .  Atopy Neg Hx    Social History   Socioeconomic History  . Marital status: Married    Spouse name: Not on file  . Number of children: Not on file  . Years of education: Not on file  . Highest education level: Not on file  Occupational History  . Not on file  Tobacco Use  . Smoking status: Never Smoker  . Smokeless tobacco: Never Used  Vaping Use  . Vaping Use: Never used  Substance and Sexual Activity  . Alcohol use: Yes    Comment: occasional wine  . Drug use: Never  . Sexual activity: Not on file  Other Topics Concern  . Not on file  Social History Narrative  . Not on file   Social Determinants of Health   Financial Resource Strain: Not on file  Food Insecurity: Not on file  Transportation Needs: Not on file  Physical Activity: Not on file  Stress: Not on file  Social Connections: Not  on file    Tobacco Counseling Counseling given: Not Answered   Clinical Intake:                 Diabetic?yes Nutrition Risk Assessment:  Has the patient had any N/V/D within the last 2 months?  No  Does the patient have any non-healing wounds?  No  Has the patient had any unintentional weight loss or weight gain?  No   Diabetes:  Is the patient diabetic?  Yes  If diabetic, was a CBG obtained today?  No  Did the patient bring in their glucometer from home?  No  How often do you monitor your CBG's? Never .   Financial Strains and Diabetes Management:  Are you having any financial strains with the device, your supplies or your medication? No .  Does the patient want to be seen by Chronic Care Management for management of their diabetes?  No  Would the patient like to be referred to a Nutritionist or for Diabetic Management?  No   Diabetic Exams:  Diabetic Eye Exam: Completed 01/2020 Diabetic Foot Exam: Overdue, Pt has been advised about the importance in completing this exam. Pt is scheduled for diabetic foot exam on will next office visit .          Activities of Daily Living No flowsheet data found.  Patient Care Team: Caren Macadam, MD as PCP - General (Family Medicine)  Indicate any recent Medical Services you may have received from other than [facility] providers in the past year (date may be approximate).     Assessment:   This is a routine wellness examination for Renee Bauer.  Hearing/Vision screen No exam data present  Dietary issues and exercise activities discussed:    Goals Addressed   None    Depression Screen PHQ 2/9 Scores 03/24/2020 09/19/2019 05/21/2019 12/20/2018 08/29/2018  PHQ - 2 Score 1 4 6 3 2   PHQ- 9 Score - 7 27 14 5     Fall Risk Fall Risk  03/24/2020 12/20/2018  Falls in the past year? 0 0  Number falls in past yr: 0 0  Injury with Fall? - 0    FALL RISK PREVENTION PERTAINING TO THE HOME:  Any stairs in or around the  home? Yes  If so, are there any without handrails? Yes  Home free of loose throw rugs in walkways, pet beds, electrical cords, etc? Yes  Adequate lighting in your home to reduce risk of falls? Yes   ASSISTIVE DEVICES UTILIZED TO PREVENT  FALLS:  Life alert? No  Use of a cane, walker or w/c? No  Grab bars in the bathroom? Yes  Shower chair or bench in shower? Yes  Elevated toilet seat or a handicapped toilet? No   TIMED UP AND GO:  Was the test performed? Yes .  Length of time to ambulate 10 feet: 6 sec.   Gait steady and fast without use of assistive device  Cognitive Function:        Immunizations Immunization History  Administered Date(s) Administered  . Fluad Quad(high Dose 65+) 11/04/2018  . Moderna Sars-Covid-2 Vaccination 06/10/2019, 07/06/2019  . Pneumococcal Conjugate-13 11/04/2018  . Td 07/19/2016    TDAP status: Up to date  Flu Vaccine status: Up to date  Pneumococcal vaccine status: Up to date  Covid-19 vaccine status: Completed vaccines  Qualifies for Shingles Vaccine? Yes   Zostavax completed No   Shingrix Completed?: No.    Education has been provided regarding the importance of this vaccine. Patient has been advised to call insurance company to determine out of pocket expense if they have not yet received this vaccine. Advised may also receive vaccine at local pharmacy or Health Dept. Verbalized acceptance and understanding.  Screening Tests Health Maintenance  Topic Date Due  . PNA vac Low Risk Adult (2 of 2 - PPSV23) 11/04/2019  . COVID-19 Vaccine (3 - Booster for Moderna series) 01/05/2020  . INFLUENZA VACCINE  08/30/2020  . HEMOGLOBIN A1C  09/21/2020  . OPHTHALMOLOGY EXAM  03/01/2021  . FOOT EXAM  03/24/2021  . URINE MICROALBUMIN  03/24/2021  . MAMMOGRAM  09/30/2021  . COLONOSCOPY (Pts [id number] Insurance coverage will need to be confirmed)  05/31/2026  . TETANUS/TDAP  07/20/2026  . DEXA SCAN  Completed  . Hepatitis C Screening  Completed   . HPV VACCINES  Aged Out    Health Maintenance  Health Maintenance Due  Topic Date Due  . PNA vac Low Risk Adult (2 of 2 - PPSV23) 11/04/2019  . COVID-19 Vaccine (3 - Booster for Moderna series) 01/05/2020    Colorectal cancer screening: Type of screening: Colonoscopy. Completed 05/30/2016. Repeat every 10 years  Mammogram status: Completed 10/01/2019. Repeat every year  Bone Density status: Completed 05/02/2019. Results reflect: Bone density results: OSTEOPENIA. Repeat every 3 years.  Lung Cancer Screening: (Low Dose CT Chest recommended if Age 76-80 years, 30 pack-year currently smoking OR have quit w/in 15years.) does not qualify.   Lung Cancer Screening Referral: n/a  Additional Screening:  Hepatitis C Screening: does qualify; Completed 11/01/2018   Vision Screening: Recommended annual ophthalmology exams for early detection of glaucoma and other disorders of the eye. Is the patient up to date with their annual eye exam?  Yes  Who is the provider or what is the name of the office in which the patient attends annual eye exams? Dr.Grota If pt is not established with a provider, would they like to be referred to a provider to establish care? No .   Dental Screening: Recommended annual dental exams for proper oral hygiene  Community Resource Referral / Chronic Care Management: CRR required this visit?  No   CCM required this visit?  No      Plan:     I have personally reviewed and noted the following in the patient's chart:   . Medical and social history . Use of alcohol, tobacco or illicit drugs  . Current medications and supplements including opioid prescriptions. Patient is not currently taking opioid prescriptions. . Functional ability  and status . Nutritional status . Physical activity . Advanced directives . List of other physicians . Hospitalizations, surgeries, and ER visits in previous 12 months . Vitals . Screenings to include cognitive, depression,  and falls . Referrals and appointments  In addition, I have reviewed and discussed with patient certain preventive protocols, quality metrics, and best practice recommendations. A written personalized care plan for preventive services as well as general preventive health recommendations were provided to patient.     Randel Pigg, LPN   06/02/84   Nurse Notes: none

## 2020-06-04 NOTE — Patient Instructions (Signed)
Renee Bauer , Thank you for taking time to come for your Medicare Wellness Visit. I appreciate your ongoing commitment to your health goals. Please review the following plan we discussed and let me know if I can assist you in the future.   Screening recommendations/referrals: Colonoscopy: current due 05/31/2026 Mammogram: due 09/30/2020 Bone Density: current  Recommended yearly ophthalmology/optometry visit for glaucoma screening and checkup Recommended yearly dental visit for hygiene and checkup  Vaccinations: Influenza vaccine: Due fall 2022 Pneumococcal vaccine: will obtain 2nd injection  Tdap vaccine: current due 07/20/2026 Shingles vaccine: will obtain local pharmacy  Advanced directives: will provide copies   Conditions/risks identified: none   Next appointment: none    Preventive Care 57 Years and Older, Female Preventive care refers to lifestyle choices and visits with your health care provider that can promote health and wellness. What does preventive care include?  A yearly physical exam. This is also called an annual well check.  Dental exams once or twice a year.  Routine eye exams. Ask your health care provider how often you should have your eyes checked.  Personal lifestyle choices, including:  Daily care of your teeth and gums.  Regular physical activity.  Eating a healthy diet.  Avoiding tobacco and drug use.  Limiting alcohol use.  Practicing safe sex.  Taking low-dose aspirin every day.  Taking vitamin and mineral supplements as recommended by your health care provider. What happens during an annual well check? The services and screenings done by your health care provider during your annual well check will depend on your age, overall health, lifestyle risk factors, and family history of disease. Counseling  Your health care provider may ask you questions about your:  Alcohol use.  Tobacco use.  Drug use.  Emotional well-being.  Home and  relationship well-being.  Sexual activity.  Eating habits.  History of falls.  Memory and ability to understand (cognition).  Work and work Statistician.  Reproductive health. Screening  You may have the following tests or measurements:  Height, weight, and BMI.  Blood pressure.  Lipid and cholesterol levels. These may be checked every 5 years, or more frequently if you are over 50 years old.  Skin check.  Lung cancer screening. You may have this screening every year starting at age 49 if you have a 30-pack-year history of smoking and currently smoke or have quit within the past 15 years.  Fecal occult blood test (FOBT) of the stool. You may have this test every year starting at age 84.  Flexible sigmoidoscopy or colonoscopy. You may have a sigmoidoscopy every 5 years or a colonoscopy every 10 years starting at age 8.  Hepatitis C blood test.  Hepatitis B blood test.  Sexually transmitted disease (STD) testing.  Diabetes screening. This is done by checking your blood sugar (glucose) after you have not eaten for a while (fasting). You may have this done every 1-3 years.  Bone density scan. This is done to screen for osteoporosis. You may have this done starting at age 14.  Mammogram. This may be done every 1-2 years. Talk to your health care provider about how often you should have regular mammograms. Talk with your health care provider about your test results, treatment options, and if necessary, the need for more tests. Vaccines  Your health care provider may recommend certain vaccines, such as:  Influenza vaccine. This is recommended every year.  Tetanus, diphtheria, and acellular pertussis (Tdap, Td) vaccine. You may need a Td booster every 10  years.  Zoster vaccine. You may need this after age 60.  Pneumococcal 13-valent conjugate (PCV13) vaccine. One dose is recommended after age 68.  Pneumococcal polysaccharide (PPSV23) vaccine. One dose is recommended after  age 52. Talk to your health care provider about which screenings and vaccines you need and how often you need them. This information is not intended to replace advice given to you by your health care provider. Make sure you discuss any questions you have with your health care provider. Document Released: 02/12/2015 Document Revised: 10/06/2015 Document Reviewed: 11/17/2014 Elsevier Interactive Patient Education  2017 [place] Prevention in the Home Falls can cause injuries. They can happen to people of all ages. There are many things you can do to make your home safe and to help prevent falls. What can I do on the outside of my home?  Regularly fix the edges of walkways and driveways and fix any cracks.  Remove anything that might make you trip as you walk through a door, such as a raised step or threshold.  Trim any bushes or trees on the path to your home.  Use bright outdoor lighting.  Clear any walking paths of anything that might make someone trip, such as rocks or tools.  Regularly check to see if handrails are loose or broken. Make sure that both sides of any steps have handrails.  Any raised decks and porches should have guardrails on the edges.  Have any leaves, snow, or ice cleared regularly.  Use sand or salt on walking paths during winter.  Clean up any spills in your garage right away. This includes oil or grease spills. What can I do in the bathroom?  Use night lights.  Install grab bars by the toilet and in the tub and shower. Do not use towel bars as grab bars.  Use non-skid mats or decals in the tub or shower.  If you need to sit down in the shower, use a plastic, non-slip stool.  Keep the floor dry. Clean up any water that spills on the floor as soon as it happens.  Remove soap buildup in the tub or shower regularly.  Attach bath mats securely with double-sided non-slip rug tape.  Do not have throw rugs and other things on the floor that can  make you trip. What can I do in the bedroom?  Use night lights.  Make sure that you have a light by your bed that is easy to reach.  Do not use any sheets or blankets that are too big for your bed. They should not hang down onto the floor.  Have a firm chair that has side arms. You can use this for support while you get dressed.  Do not have throw rugs and other things on the floor that can make you trip. What can I do in the kitchen?  Clean up any spills right away.  Avoid walking on wet floors.  Keep items that you use a lot in easy-to-reach places.  If you need to reach something above you, use a strong step stool that has a grab bar.  Keep electrical cords out of the way.  Do not use floor polish or wax that makes floors slippery. If you must use wax, use non-skid floor wax.  Do not have throw rugs and other things on the floor that can make you trip. What can I do with my stairs?  Do not leave any items on the stairs.  Make sure that  there are handrails on both sides of the stairs and use them. Fix handrails that are broken or loose. Make sure that handrails are as long as the stairways.  Check any carpeting to make sure that it is firmly attached to the stairs. Fix any carpet that is loose or worn.  Avoid having throw rugs at the top or bottom of the stairs. If you do have throw rugs, attach them to the floor with carpet tape.  Make sure that you have a light switch at the top of the stairs and the bottom of the stairs. If you do not have them, ask someone to add them for you. What else can I do to help prevent falls?  Wear shoes that:  Do not have high heels.  Have rubber bottoms.  Are comfortable and fit you well.  Are closed at the toe. Do not wear sandals.  If you use a stepladder:  Make sure that it is fully opened. Do not climb a closed stepladder.  Make sure that both sides of the stepladder are locked into place.  Ask someone to hold it for you,  if possible.  Clearly mark and make sure that you can see:  Any grab bars or handrails.  First and last steps.  Where the edge of each step is.  Use tools that help you move around (mobility aids) if they are needed. These include:  Canes.  Walkers.  Scooters.  Crutches.  Turn on the lights when you go into a dark area. Replace any light bulbs as soon as they burn out.  Set up your furniture so you have a clear path. Avoid moving your furniture around.  If any of your floors are uneven, fix them.  If there are any pets around you, be aware of where they are.  Review your medicines with your doctor. Some medicines can make you feel dizzy. This can increase your chance of falling. Ask your doctor what other things that you can do to help prevent falls. This information is not intended to replace advice given to you by your health care provider. Make sure you discuss any questions you have with your health care provider. Document Released: 11/12/2008 Document Revised: 06/24/2015 Document Reviewed: 02/20/2014 Elsevier Interactive Patient Education  2017 [employer].

## 2020-06-08 ENCOUNTER — Ambulatory Visit: Payer: Medicare Other | Admitting: Allergy and Immunology

## 2020-06-09 ENCOUNTER — Ambulatory Visit (INDEPENDENT_AMBULATORY_CARE_PROVIDER_SITE_OTHER): Payer: Medicare Other | Admitting: Podiatry

## 2020-06-09 ENCOUNTER — Other Ambulatory Visit: Payer: Self-pay

## 2020-06-09 DIAGNOSIS — M2041 Other hammer toe(s) (acquired), right foot: Secondary | ICD-10-CM

## 2020-06-09 DIAGNOSIS — M79675 Pain in left toe(s): Secondary | ICD-10-CM | POA: Diagnosis not present

## 2020-06-09 DIAGNOSIS — M79674 Pain in right toe(s): Secondary | ICD-10-CM | POA: Diagnosis not present

## 2020-06-09 DIAGNOSIS — E1142 Type 2 diabetes mellitus with diabetic polyneuropathy: Secondary | ICD-10-CM

## 2020-06-09 DIAGNOSIS — B351 Tinea unguium: Secondary | ICD-10-CM

## 2020-06-10 ENCOUNTER — Encounter: Payer: Self-pay | Admitting: Podiatry

## 2020-06-10 NOTE — Progress Notes (Signed)
Subjective:  Patient ID: Renee Bauer, female    DOB: [birth year],  MRN: [id number]  No chief complaint on file.   70 y.o. female presents with the above complaint.  Patient presents with a follow-up of thickened elongated distal color dystrophic painful toenails x10.  She is unable to debride on herself.  She would like for [place] to have them debrided down.  She does not have any secondary complaints.  She is a diabetic last A1c 5.5   Review of Systems: Negative except as noted in the HPI. Denies N/V/F/Ch.  Past Medical History:  Diagnosis Date  . Anxiety   . Arthritis   . Benign colon polyp 2018  . Bursitis   . Depression   . Diabetes mellitus without complication ([place])   . GERD (gastroesophageal reflux disease)   . Hyperlipidemia   . Rosacea     Current Outpatient Medications:  .  ALPRAZolam (XANAX) 1 MG tablet, Take 1 tablet (1 mg total) by mouth as needed for anxiety., Disp: 30 tablet, Rfl: 2 .  Azelastine & Fluticasone 137 & 50 MCG/ACT THPK, Place 1 spray into the nose in the morning and at bedtime., Disp: 1 each, Rfl: 5 .  betamethasone acetate-betamethasone sodium phosphate (CELESTONE) 6 (3-3) MG/ML injection, Inject into the articular space., Disp: , Rfl:  .  CALCIUM CITRATE PO, Take 600 mg by mouth daily., Disp: , Rfl:  .  Cholecalciferol (VITAMIN D3 PO), Take 2,000 Units by mouth daily., Disp: , Rfl:  .  esomeprazole (NEXIUM) 40 MG capsule, Take 1 capsule (40 mg total) by mouth daily., Disp: 90 capsule, Rfl: 1 .  gabapentin (NEURONTIN) 100 MG capsule, TAKE 1 CAPSULE THREE TIMES A DAY, Disp: 90 capsule, Rfl: 11 .  Ginger, Zingiber officinalis, (GINGER ROOT PO), Take by mouth., Disp: , Rfl:  .  lidocaine (XYLOCAINE) 1 % (with preservative) injection, by Infiltration route., Disp: , Rfl:  .  loratadine (CLARITIN) 10 MG tablet, Take 10 mg by mouth daily., Disp: , Rfl:  .  Melatonin 10 MG TABS, Take by mouth at bedtime., Disp: , Rfl:  .  meloxicam (MOBIC) 7.5 MG tablet, Take  1 tablet (7.5 mg total) by mouth 2 (two) times daily as needed for pain., Disp: 180 tablet, Rfl: 1 .  METRONIDAZOLE, TOPICAL, 0.75 % LOTN, as needed. , Disp: , Rfl:  .  montelukast (SINGULAIR) 10 MG tablet, Take 1 tablet (10 mg total) by mouth at bedtime., Disp: 30 tablet, Rfl: 5 .  OVER THE COUNTER MEDICATION, Zeasorb powder, Disp: , Rfl:  .  rosuvastatin (CRESTOR) 10 MG tablet, TAKE 1 TABLET AT BEDTIME, Disp: 90 tablet, Rfl: 2 .  TART CHERRY PO, Take 1,000 mg by mouth., Disp: , Rfl:  .  TERBINAFINE EX, Apply topically., Disp: , Rfl:  .  TURMERIC CURCUMIN PO, Take 500 mg by mouth daily., Disp: , Rfl:  .  vitamin B-12 (CYANOCOBALAMIN) 1000 MCG tablet, Take 1,000 mcg by mouth daily., Disp: , Rfl:  .  zolpidem (AMBIEN CR) 6.25 MG CR tablet, Take 1 tablet (6.25 mg total) by mouth at bedtime as needed for sleep., Disp: 90 tablet, Rfl: 3  Social History   Tobacco Use  Smoking Status Never Smoker  Smokeless Tobacco Never Used    Allergies  Allergen Reactions  . Wellbutrin [Bupropion]     Muscle twitches  . Clindamycin Hcl Rash   Objective:  There were no vitals filed for this visit. There is no height or weight on file to calculate  BMI. Constitutional Well developed. Well nourished.  Vascular Dorsalis pedis pulses palpable bilaterally. Posterior tibial pulses palpable bilaterally. Capillary refill normal to all digits.  No cyanosis or clubbing noted. Pedal hair growth normal.  Neurologic Normal speech. Oriented to person, place, and time. Epicritic sensation to light touch grossly present bilaterally. Negative Tinel's sign. Nail Exam: Pt has thick disfigured discolored nails with subungual debris noted bilateral entire nail hallux through fifth toenails with an improvement to the left second digit  Dermatologic Nails well groomed and normal in appearance. No open wounds. No skin lesions.  Orthopedic:  No pain on palpation to anywhere on the left foot.  Manual muscle testing 5 out  of 5.  Mild hammertoe contractures noted bilaterally 2 through 5 with the worst being for on the right side.  Hallux rigidus noted to bilateral first metatarsophalangeal joint with right greater than left.    Radiographs: None Assessment:   1. Hammertoe of right foot   2. Controlled type 2 diabetes mellitus with diabetic polyneuropathy, without long-term current use of insulin (HCC)   3. Pain due to onychomycosis of toenails of both feet    Plan:  Patient was evaluated and treated and all questions answered.  Right fourth digit hammertoe -I explained to patient the etiology of hammertoe and various treatment options were extensively discussed.  Given that the contracture of the toes leading to pressure on the distal tip of the callus I believe she will benefit from offloading pad.  Toe protector was dispensed.  I have asked her to utilize this as well as make shoe gear modification.  She states understanding.  Onychomycosis with pain with left second digit being worse -Nails palliatively debrided as below. -Educated on self-care -She has completed the Lamisil therapy.  With one 68-month course there is a complete improvement of the nail fungus.  Overall patient does not need any further Lamisil therapy  Pes planovalgus with associated hallux rigidus -I explained the patient the etiology of hallux rigidus and various treatment options were extensively discussed.  Clinically patient has a little bit of intra-articular pain I believe she will benefit from custom-made orthotics to help address the hallux rigidus component with Morton's extension as well as pes planus deformity.  Patient states understanding would like to proceed with getting orthotics. -Patient has picked up her custom-made orthotics.  Procedure: Nail Debridement Rationale: pain  Type of Debridement: manual, sharp debridement. Instrumentation: Nail nipper, rotary burr. Number of Nails: 10  Procedures and Treatment: Consent  by patient was obtained for treatment procedures. The patient understood the discussion of treatment and procedures well. All questions were answered thoroughly reviewed. Debridement of mycotic and hypertrophic toenails, 1 through 5 bilateral and clearing of subungual debris. No ulceration, no infection noted.  Return Visit-Office Procedure: Patient instructed to return to the office for a follow up visit 3 months for continued evaluation and treatment.  Boneta Lucks, DPM    No follow-ups on file.

## 2020-06-14 ENCOUNTER — Other Ambulatory Visit: Payer: Self-pay | Admitting: Family Medicine

## 2020-06-14 ENCOUNTER — Telehealth: Payer: Self-pay | Admitting: Family Medicine

## 2020-06-14 NOTE — Telephone Encounter (Signed)
Rx previously sent. 

## 2020-06-14 NOTE — Telephone Encounter (Signed)
   pt requesting a refill on  esomeprazole (NEXIUM) 40 MG capsule Renee Bauer at [facility], [place]  Phone:  [phone] Fax:  [phone]

## 2020-06-18 ENCOUNTER — Telehealth: Payer: Self-pay

## 2020-06-18 NOTE — Telephone Encounter (Signed)
Approved for Synvisc series, left knee. Renee Bauer Patient is covered at 100% through her insurance No Co-pay No PA required  Appt. 07/13/2020 with Dr. Durward Fortes

## 2020-06-22 ENCOUNTER — Telehealth: Payer: Self-pay | Admitting: Family Medicine

## 2020-06-22 NOTE — Telephone Encounter (Signed)
Patient is needing an appointment sooner than 06/22 for a preoperative risk assessment before surgery in August.  Can we schedule her sooner?

## 2020-06-23 ENCOUNTER — Ambulatory Visit (INDEPENDENT_AMBULATORY_CARE_PROVIDER_SITE_OTHER): Payer: Medicare Other | Admitting: Family Medicine

## 2020-06-23 ENCOUNTER — Encounter: Payer: Self-pay | Admitting: Family Medicine

## 2020-06-23 ENCOUNTER — Other Ambulatory Visit: Payer: Self-pay

## 2020-06-23 VITALS — BP 98/70 | HR 85 | Temp 97.7°F | Ht 61.0 in | Wt 164.5 lb

## 2020-06-23 DIAGNOSIS — E1142 Type 2 diabetes mellitus with diabetic polyneuropathy: Secondary | ICD-10-CM

## 2020-06-23 DIAGNOSIS — M1712 Unilateral primary osteoarthritis, left knee: Secondary | ICD-10-CM | POA: Diagnosis not present

## 2020-06-23 DIAGNOSIS — F322 Major depressive disorder, single episode, severe without psychotic features: Secondary | ICD-10-CM

## 2020-06-23 DIAGNOSIS — M47816 Spondylosis without myelopathy or radiculopathy, lumbar region: Secondary | ICD-10-CM

## 2020-06-23 DIAGNOSIS — M51369 Other intervertebral disc degeneration, lumbar region without mention of lumbar back pain or lower extremity pain: Secondary | ICD-10-CM

## 2020-06-23 DIAGNOSIS — F419 Anxiety disorder, unspecified: Secondary | ICD-10-CM

## 2020-06-23 DIAGNOSIS — M5136 Other intervertebral disc degeneration, lumbar region: Secondary | ICD-10-CM

## 2020-06-23 LAB — POCT GLYCOSYLATED HEMOGLOBIN (HGB A1C): Hemoglobin A1C: 5.7 % — AB (ref 4.0–5.6)

## 2020-06-23 MED ORDER — BUSPIRONE HCL 15 MG PO TABS: ORAL_TABLET | ORAL | 1 refills | Status: DC

## 2020-06-23 NOTE — Telephone Encounter (Signed)
Patient is scheduled today at 3:30 and I told her to come in at 3:15

## 2020-06-23 NOTE — Telephone Encounter (Signed)
Her last visit was for a physical and she was stable at that time for surgery. I could complete paperwork and order any needed evaluation if she would like and I do not feel she will need an in person visit for me to do that. If she wants to come sooner and wants in office I would be ok with adding her in at 3:30 today (get here at 3:15). If that timing does not work; let me know.

## 2020-06-23 NOTE — Progress Notes (Signed)
Renee Bauer Date of Birth:  1950-05-19  This patient presents to the office today for a preoperative consultation at the request of surgeon, Dr. Ronnie Derby, who plans on performing TKA on August 2022.   Has to reposition frequently at night due to left knee pain; bone on bone. She has been doing injections since 2018 for this. It does limit her activity.   Lower back also bothering her a lot; maybe more on right side. This limits her activity as well.   Still walking 2-3 miles 4-5 times/week. Not able to exercise core due to back pain. Enjoys reading, watching tv in evening with husband.   Planned anesthesia: general  Known anesthesia problems: none  Bleeding risk: none Personal or FH of DVT/PE: none    Patient Active Problem List   Diagnosis Date Noted  . Primary osteoarthritis of both hands 07/06/2019  . Primary osteoarthritis of both knees 07/06/2019  . Unilateral primary osteoarthritis, left knee 10/31/2018  . Arthritis 10/30/2018  . DM (diabetes mellitus) type II controlled, neurological manifestation ([place]) 10/30/2018  . Neuropathy of both feet 10/30/2018  . Anxiety 10/30/2018  . Insomnia 10/30/2018  . History of skin cancer 10/30/2018  . Osteopenia 09/30/2018   Past Surgical History:  Procedure Laterality Date  . ABDOMINAL HYSTERECTOMY     menorrhagia  . BLADDER REPAIR    . BREAST BIOPSY Left   . BUNIONECTOMY Right   . COLON RESECTION  2007  . DILATION AND CURETTAGE OF UTERUS      Allergies  Allergen Reactions  . Wellbutrin [Bupropion]     Muscle twitches  . Clindamycin Hcl Rash   Current Meds  Medication Sig  . ALPRAZolam (XANAX) 1 MG tablet Take 1 tablet (1 mg total) by mouth as needed for anxiety.  . Azelastine & Fluticasone 137 & 50 MCG/ACT THPK Place 1 spray into the nose in the morning and at bedtime.  . betamethasone acetate-betamethasone sodium phosphate (CELESTONE) 6 (3-3) MG/ML injection Inject into the articular space.  . busPIRone (BUSPAR) 15 MG  tablet Start with half tab po daily x 3 days, then increase to half tab PO BID x 7 days, then start full tab PO BID  . CALCIUM CITRATE PO Take 600 mg by mouth daily.  . Cholecalciferol (VITAMIN D3 PO) Take 2,000 Units by mouth daily.  Marland Kitchen esomeprazole (NEXIUM) 40 MG capsule TAKE ONE CAPSULE BY MOUTH DAILY  . gabapentin (NEURONTIN) 100 MG capsule TAKE 1 CAPSULE THREE TIMES A DAY  . Ginger, Zingiber officinalis, (GINGER ROOT PO) Take by mouth.  . lidocaine (XYLOCAINE) 1 % (with preservative) injection by Infiltration route.  . meloxicam (MOBIC) 7.5 MG tablet Take 1 tablet (7.5 mg total) by mouth 2 (two) times daily as needed for pain.  Marland Kitchen METRONIDAZOLE, TOPICAL, 0.75 % LOTN as needed.   . montelukast (SINGULAIR) 10 MG tablet Take 1 tablet (10 mg total) by mouth at bedtime.  Marland Kitchen OVER THE COUNTER MEDICATION Zeasorb powder  . rosuvastatin (CRESTOR) 10 MG tablet TAKE 1 TABLET AT BEDTIME  . TART CHERRY PO Take 1,000 mg by mouth.  . TERBINAFINE EX Apply topically.  . TURMERIC CURCUMIN PO Take 500 mg by mouth daily.  . vitamin B-12 (CYANOCOBALAMIN) 1000 MCG tablet Take 1,000 mcg by mouth daily.  Marland Kitchen zolpidem (AMBIEN CR) 6.25 MG CR tablet Take 1 tablet (6.25 mg total) by mouth at bedtime as needed for sleep.    Social History   Tobacco Use  . Smoking status: Never Smoker  .  Smokeless tobacco: Never Used  Substance Use Topics  . Alcohol use: Yes    Comment: occasional wine   Family History  Problem Relation Age of Onset  . Diabetes Mother   . Arrhythmia Mother   . High blood pressure Mother   . Mesothelioma Father        asbestos exposure  . Allergic rhinitis Father   . Sinusitis Father   . Healthy Sister   . Brain cancer Maternal Aunt   . Post-traumatic stress disorder Son   . Scoliosis Son   . Asthma Neg Hx   . Eczema Neg Hx   . Urticaria Neg Hx   . Angioedema Neg Hx   . Immunodeficiency Neg Hx   . Atopy Neg Hx     Review of Systems Feels like energy level down due to weight gain.  Hard to get to sleep. Thinking, worrying.   Review of Systems  Constitutional: Negative for malaise/fatigue and weight loss.  HENT: Negative for hearing loss.   Respiratory: Negative for cough, shortness of breath and wheezing.   Cardiovascular: Negative for chest pain, palpitations, orthopnea and leg swelling.  Gastrointestinal: Negative for heartburn.  Musculoskeletal: Positive for back pain and joint pain.  Neurological: Negative for dizziness and headaches.  Psychiatric/Behavioral: Positive for depression. The patient is nervous/anxious and has insomnia.       Recent Labs: CBC:  Lab Results  Component Value Date   WBC 6.3 03/24/2020   HGB 14.5 03/24/2020   HCT 42.8 03/24/2020   MCH 29.5 09/19/2019   MCHC 33.8 03/24/2020   RDW 12.9 03/24/2020   PLT 267.0 03/24/2020   MPV 10.4 09/19/2019   CMP:  Lab Results  Component Value Date   NA 137 03/24/2020   NA 141 05/14/2018   K 4.1 03/24/2020   CL 102 03/24/2020   CO2 25 03/24/2020   GLUCOSE 123 (H) 03/24/2020   BUN 12 03/24/2020   BUN 17 05/14/2018   CREATININE 0.84 03/24/2020   CREATININE 0.91 09/19/2019   CALCIUM 10.1 03/24/2020   PROT 7.0 03/24/2020   BILITOT 0.5 03/24/2020   ALKPHOS 70 03/24/2020   ALT 13 03/24/2020   AST 17 03/24/2020    HBA1C: No results found for: LABA1C, EAG  Objective:   BP 98/70 (BP Location: Left Arm, Patient Position: Sitting, Cuff Size: Large)   Pulse 85   Temp 97.7 F (36.5 C) (Oral)   Ht 5\' 1"  (1.549 m)   Wt 164 lb 8 oz (74.6 kg)   SpO2 97%   BMI 31.08 kg/m  Weight: 164 lb 8 oz (74.6 kg)    Physical Exam Constitutional:      General: She is not in acute distress.    Appearance: She is well-developed.  HENT:     Head: Normocephalic and atraumatic.     Right Ear: External ear normal.     Left Ear: External ear normal.     Mouth/Throat:     Pharynx: No oropharyngeal exudate.  Eyes:     Conjunctiva/sclera: Conjunctivae normal.     Pupils: Pupils are equal, round, and  reactive to light.  Neck:     Thyroid: No thyromegaly.  Cardiovascular:     Rate and Rhythm: Normal rate and regular rhythm.     Heart sounds: Normal heart sounds. No murmur heard. No friction rub. No gallop.   Pulmonary:     Effort: Pulmonary effort is normal.     Breath sounds: Normal breath sounds.  Abdominal:  General: Bowel sounds are normal. There is no distension.     Palpations: Abdomen is soft. There is no mass.     Tenderness: There is no abdominal tenderness. There is no guarding.     Hernia: No hernia is present.  Musculoskeletal:        General: No tenderness or deformity. Normal range of motion.     Cervical back: Normal range of motion and neck supple.  Lymphadenopathy:     Cervical: No cervical adenopathy.  Skin:    General: Skin is warm and dry.     Findings: No rash.  Neurological:     Mental Status: She is alert and oriented to person, place, and time.     Deep Tendon Reflexes: Reflexes normal.     Reflex Scores:      Tricep reflexes are 2+ on the right side and 2+ on the left side.      Bicep reflexes are 2+ on the right side and 2+ on the left side.      Brachioradialis reflexes are 2+ on the right side and 2+ on the left side.      Patellar reflexes are 2+ on the right side and 2+ on the left side. Psychiatric:        Attention and Perception: Attention normal.        Mood and Affect: Mood is anxious and depressed.        Speech: Speech normal.        Behavior: Behavior normal.        Thought Content: Thought content normal.      EKG Interpretation:  normal EKG, normal sinus rhythm, unchangedfrom previous tracings.  Lab Review: A1C improved at 5.7 (previous 5.9)    Assessment:   Kadra was seen today for pre-op exam.  Diagnoses and all orders for this visit:  Arthritis of left knee  Spondylosis of lumbar spine -     MR Lumbar Spine Wo Contrast; Future  Controlled type 2 diabetes mellitus with diabetic polyneuropathy, without long-term  current use of insulin (HCC) -     EKG 12-Lead -     POC HgB A1c  Anxiety: we are going to try buspar. She has not tried this before. Weight gain and insomnia with lexapro.   Other intervertebral disc degeneration, lumbar region Abnormal xray; daily discomfort with back. Would benefit from specialist eval, but will get MRI first to see if interventional versus sports med best for her.  -     MR Lumbar Spine Wo Contrast; Future  Depression, major, single episode, severe ([place]) Looking through all meds/side effects: fetzima may be best option to help with mood and not cause insomnia, weight gain. This is not listed on formulary that I tried to look up, but with her not tolerating wellbutrin, and having weight gain/insomnia with lexapro and with known side effects of other SSRIs, SNRIs; I feel there may be chance of coverage. She wants to try the buspar first; but we can keep in mind if needed for depression.  Other orders -     busPIRone (BUSPAR) 15 MG tablet; Start with half tab po daily x 3 days, then increase to half tab PO BID x 7 days, then start full tab PO BID   70 y.o.patient  approved for Surgery     Plan:   1. Preoperative workup as follows:ECG done today to have on file for baseline. She will be due in august for routine bloodwork and we can  forward to surgeon once this is complete.  2. Change in medication regimen before surgery: stop meloxicam as directed by ortho. 3. No contraindications to planned surgery  Note electronically signed by provider.  Micheline Rough, MD

## 2020-06-29 ENCOUNTER — Other Ambulatory Visit: Payer: Self-pay

## 2020-06-29 ENCOUNTER — Ambulatory Visit (INDEPENDENT_AMBULATORY_CARE_PROVIDER_SITE_OTHER): Payer: Medicare Other | Admitting: Allergy and Immunology

## 2020-06-29 ENCOUNTER — Encounter: Payer: Self-pay | Admitting: Allergy and Immunology

## 2020-06-29 VITALS — BP 118/62 | HR 66 | Temp 98.1°F | Resp 16

## 2020-06-29 DIAGNOSIS — J3089 Other allergic rhinitis: Secondary | ICD-10-CM | POA: Diagnosis not present

## 2020-06-29 MED ORDER — AZELASTINE & FLUTICASONE 137 & 50 MCG/ACT NA THPK: 1.0000 | PACK | Freq: Two times a day (BID) | NASAL | 11 refills | Status: DC

## 2020-06-29 MED ORDER — MONTELUKAST SODIUM 10 MG PO TABS: 10.0000 mg | ORAL_TABLET | Freq: Every day | ORAL | 3 refills | Status: DC

## 2020-06-29 NOTE — Progress Notes (Signed)
[facility] [place] [facility] [place]   Follow-up Note  Referring Provider: Caren Macadam, MD Primary Provider: Caren Macadam, MD Date of Office Visit: 06/29/2020  Subjective:   Renee Bauer (DOB: [birth year]) is a 70 y.o. female who returns to the [facility] on 06/29/2020 in re-evaluation of the following:  HPI: Denecia returns to this clinic in evaluation of allergic rhinitis addressed during her initial evaluation of 11 May 2020.  She has performed house dust avoidance measures with in the bedroom and she has been consistently using her Dymista at nighttime and montelukast at nighttime and she has had complete resolution of all of her upper airway symptoms and feels very good at this point in time.  Allergies as of 06/29/2020      Reactions   Wellbutrin [bupropion]    Muscle twitches   Clindamycin Hcl Rash      Medication List      ALPRAZolam 1 MG tablet Commonly known as: XANAX Take 1 tablet (1 mg total) by mouth as needed for anxiety.   Azelastine & Fluticasone 137 & 50 MCG/ACT Thpk Place 1 spray into the nose in the morning and at bedtime.   betamethasone acetate-betamethasone sodium phosphate 6 (3-3) MG/ML injection Commonly known as: CELESTONE Inject into the articular space.   busPIRone 15 MG tablet Commonly known as: BUSPAR Start with half tab po daily x 3 days, then increase to half tab PO BID x 7 days, then start full tab PO BID   CALCIUM CITRATE PO Take 600 mg by mouth daily.   esomeprazole 40 MG capsule Commonly known as: NEXIUM TAKE ONE CAPSULE BY MOUTH DAILY   gabapentin 100 MG capsule Commonly known as: NEURONTIN TAKE 1 CAPSULE THREE TIMES A DAY   GINGER ROOT PO Take by mouth.   lidocaine 1 % (with preservative) injection Commonly known as: XYLOCAINE by Infiltration route.   meloxicam 7.5 MG tablet Commonly known as: MOBIC Take 1 tablet (7.5 mg total) by mouth 2 (two) times daily as  needed for pain.   METRONIDAZOLE (TOPICAL) 0.75 % Lotn as needed.   montelukast 10 MG tablet Commonly known as: Singulair Take 1 tablet (10 mg total) by mouth at bedtime.   OVER THE COUNTER MEDICATION Zeasorb powder   rosuvastatin 10 MG tablet Commonly known as: CRESTOR TAKE 1 TABLET AT BEDTIME   TART CHERRY PO Take 1,000 mg by mouth.   TERBINAFINE EX Apply topically.   TURMERIC CURCUMIN PO Take 500 mg by mouth daily.   vitamin B-12 1000 MCG tablet Commonly known as: CYANOCOBALAMIN Take 1,000 mcg by mouth daily.   VITAMIN D3 PO Take 2,000 Units by mouth daily.   zolpidem 6.25 MG CR tablet Commonly known as: AMBIEN CR Take 1 tablet (6.25 mg total) by mouth at bedtime as needed for sleep.       Past Medical History:  Diagnosis Date  . Anxiety   . Arthritis   . Benign colon polyp 2018  . Bursitis   . Depression   . Diabetes mellitus without complication ([place])   . GERD (gastroesophageal reflux disease)   . Hyperlipidemia   . Rosacea     Past Surgical History:  Procedure Laterality Date  . ABDOMINAL HYSTERECTOMY     menorrhagia  . BLADDER REPAIR    . BREAST BIOPSY Left   . BUNIONECTOMY Right   . COLON RESECTION  2007  . DILATION AND CURETTAGE OF UTERUS  Review of systems negative except as noted in HPI / PMHx or noted below:  Review of Systems  Constitutional: Negative.   HENT: Negative.   Eyes: Negative.   Respiratory: Negative.   Cardiovascular: Negative.   Gastrointestinal: Negative.   Genitourinary: Negative.   Musculoskeletal: Negative.   Skin: Negative.   Neurological: Negative.   Endo/Heme/Allergies: Negative.   Psychiatric/Behavioral: Negative.      Objective:   Vitals:   06/29/20 1555  BP: 118/62  Pulse: 66  Resp: 16  Temp: 98.1 F (36.7 C)  SpO2: 96%          Physical Exam Constitutional:      Appearance: She is not diaphoretic.  HENT:     Head: Normocephalic.     Right Ear: Tympanic membrane, ear canal and  external ear normal.     Left Ear: Tympanic membrane, ear canal and external ear normal.     Nose: Nose normal. No mucosal edema or rhinorrhea.     Mouth/Throat:     Pharynx: Uvula midline. No oropharyngeal exudate.  Eyes:     Conjunctiva/sclera: Conjunctivae normal.  Neck:     Thyroid: No thyromegaly.     Trachea: Trachea normal. No tracheal tenderness or tracheal deviation.  Cardiovascular:     Rate and Rhythm: Normal rate and regular rhythm.     Heart sounds: Normal heart sounds, S1 normal and S2 normal. No murmur heard.   Pulmonary:     Effort: No respiratory distress.     Breath sounds: Normal breath sounds. No stridor. No wheezing or rales.  Lymphadenopathy:     Head:     Right side of head: No tonsillar adenopathy.     Left side of head: No tonsillar adenopathy.     Cervical: No cervical adenopathy.  Skin:    Findings: No erythema or rash.     Nails: There is no clubbing.  Neurological:     Mental Status: She is alert.     Diagnostics: none  Assessment and Plan:   1. Perennial allergic rhinitis     1.  Continue to perform allergen avoidance measures - dust mite  2.  Continue to treat and prevent inflammation:   A. Dymista (fluticasone + azelastine) - 1 spray each nostril 2 times per day  B. Montelukast 10 mg - 1 tablet 1 time per day  3. If needed:   A. Nasal saline  B. OTC antihistamine  4.  Return to clinic if needed  Abbigal has had an excellent response to medical therapy which includes a combination of allergen avoidance measures and the use of some anti-inflammatory agents for her airway.  Assuming she continues to do well with this plan she can have refills performed by her primary care doctor.  We will certainly be available to see her back in this clinic if this plan fails in the future.  Allena Katz, MD Allergy / Immunology [place]

## 2020-06-29 NOTE — Patient Instructions (Addendum)
  1.  Continue to perform allergen avoidance measures - dust mite  2.  Continue to treat and prevent inflammation:   A. Dymista (fluticasone + azelastine) - 1 spray each nostril 2 times per day  B. Montelukast 10 mg - 1 tablet 1 time per day  3. If needed:   A. Nasal saline  B. OTC antihistamine  4.  Return to clinic if needed

## 2020-06-30 ENCOUNTER — Encounter: Payer: Self-pay | Admitting: Allergy and Immunology

## 2020-07-01 NOTE — Progress Notes (Signed)
Office Visit Note  Patient: Renee Bauer             Date of Birth: 09-22-1950           MRN: [id number]             PCP: Caren Macadam, MD Referring: Caren Macadam, MD Visit Date: 07/15/2020 Occupation: @GUAROCC @  Subjective:  Other (Patient reports low back pain and she had recent MRI. Patient reports left hand pain, she received cortisone injection approximately 6 weeks ago. )   History of Present Illness: Renee Bauer is a 70 y.o. female with history of osteoarthritis and degenerative disc disease.  She states she has been having increased pain and discomfort in her hands from osteoarthritis.  She has been using a brace at nighttime and also has been getting frequent CMC injections by Dr. Burney Gauze.  Her left hand continues to hurt.  She was also seen by an orthopedic surgeon for her left knee joint pain.  She states she was told that she has end-stage osteoarthritis and she will need total knee replacement.  She continues to have some discomfort in her feet from neuropathy.  She has been having increased pain and discomfort in her lower back.  She has had recent MRI.  I reviewed her MRI from July 09, 2020 which showed multilevel level spondylosis and facet joint arthropathy.  Activities of Daily Living:  Patient reports morning stiffness for 15-20 minutes.   Patient Reports nocturnal pain.  Difficulty dressing/grooming: Denies Difficulty climbing stairs: Reports Difficulty getting out of chair: Reports Difficulty using hands for taps, buttons, cutlery, and/or writing: Reports  Review of Systems  Constitutional:  Positive for fatigue.  HENT:  Negative for mouth sores, mouth dryness and nose dryness.   Eyes:  Negative for pain, itching and dryness.  Respiratory:  Negative for shortness of breath and difficulty breathing.   Cardiovascular:  Negative for chest pain and palpitations.  Gastrointestinal:  Negative for blood in stool, constipation and diarrhea.   Endocrine: Negative for increased urination.  Genitourinary:  Negative for difficulty urinating.  Musculoskeletal:  Positive for joint pain, joint pain and morning stiffness. Negative for joint swelling, myalgias, muscle tenderness and myalgias.  Skin:  Negative for color change, rash and redness.  Allergic/Immunologic: Negative for susceptible to infections.  Neurological:  Positive for numbness. Negative for dizziness, headaches, memory loss and weakness.  Hematological:  Negative for bruising/bleeding tendency.  Psychiatric/Behavioral:  Negative for confusion.    PMFS History:  Patient Active Problem List   Diagnosis Date Noted   Primary osteoarthritis of both hands 07/06/2019   Primary osteoarthritis of both knees 07/06/2019   Unilateral primary osteoarthritis, left knee 10/31/2018   Arthritis 10/30/2018   DM (diabetes mellitus) type II controlled, neurological manifestation ([place]) 10/30/2018   Neuropathy of both feet 10/30/2018   Anxiety 10/30/2018   Insomnia 10/30/2018   History of skin cancer 10/30/2018   Osteopenia 09/30/2018    Past Medical History:  Diagnosis Date   Anxiety    Arthritis    Benign colon polyp 2018   Bursitis    Depression    Diabetes mellitus without complication (HCC)    GERD (gastroesophageal reflux disease)    Hyperlipidemia    Rosacea     Family History  Problem Relation Age of Onset   Diabetes Mother    Arrhythmia Mother    High blood pressure Mother    Mesothelioma Father        asbestos  exposure   Allergic rhinitis Father    Sinusitis Father    Healthy Sister    Brain cancer Maternal Aunt    Post-traumatic stress disorder Son    Scoliosis Son    Asthma Neg Hx    Eczema Neg Hx    Urticaria Neg Hx    Angioedema Neg Hx    Immunodeficiency Neg Hx    Atopy Neg Hx    Past Surgical History:  Procedure Laterality Date   ABDOMINAL HYSTERECTOMY     menorrhagia   BLADDER REPAIR     BREAST BIOPSY Left    BUNIONECTOMY Right    COLON  RESECTION  2007   DILATION AND CURETTAGE OF UTERUS     Social History   Social History Narrative   Not on file   Immunization History  Administered Date(s) Administered   Fluad Quad(high Dose 65+) 11/04/2018   Moderna Sars-Covid-2 Vaccination 06/10/2019, 07/06/2019   Pneumococcal Conjugate-13 11/04/2018   Td 07/19/2016   Zoster Recombinat (Shingrix) 06/10/2020     Objective: Vital Signs: BP 120/84 (BP Location: Left Arm, Patient Position: Sitting, Cuff Size: Normal)   Pulse 84   Resp 13   Ht 5\' 1"  (1.549 m)   Wt 162 lb (73.5 kg)   BMI 30.61 kg/m    Physical Exam Vitals and nursing note reviewed.  Constitutional:      Appearance: She is well-developed.  HENT:     Head: Normocephalic and atraumatic.  Eyes:     Conjunctiva/sclera: Conjunctivae normal.  Cardiovascular:     Rate and Rhythm: Normal rate and regular rhythm.     Heart sounds: Normal heart sounds.  Pulmonary:     Effort: Pulmonary effort is normal.     Breath sounds: Normal breath sounds.  Abdominal:     General: Bowel sounds are normal.     Palpations: Abdomen is soft.  Musculoskeletal:     Cervical back: Normal range of motion.  Lymphadenopathy:     Cervical: No cervical adenopathy.  Skin:    General: Skin is warm and dry.     Capillary Refill: Capillary refill takes less than 2 seconds.  Neurological:     Mental Status: She is alert and oriented to person, place, and time.  Psychiatric:        Behavior: Behavior normal.     Musculoskeletal Exam: C-spine was in good range of motion.  She had painful range of motion of her lumbar junction.  She had tenderness over right SI joint.  Shoulder joints, elbow joints, wrist joints, MCPs PIPs and DIPs with good range of motion.  She has prominence of bilateral CMC, PIP and DIP joints with no synovitis.  Hip joints with good range of motion.  She tenderness on palpation over bilateral trochanteric bursae.  She had discomfort range of motion of her knee joints  without any warmth swelling or effusion.  There was no tenderness over ankles or MTPs.  CDAI Exam: CDAI Score: -- Patient Global: --; Provider Global: -- Swollen: --; Tender: -- Joint Exam 07/15/2020   No joint exam has been documented for this visit   There is currently no information documented on the homunculus. Go to the Rheumatology activity and complete the homunculus joint exam.  Investigation: No additional findings.  Imaging: MR Lumbar Spine Wo Contrast  Result Date: 07/09/2020 CLINICAL DATA:  Low back pain and left-sided weakness over the last year. Pain worse with standing. EXAM: MRI LUMBAR SPINE WITHOUT CONTRAST TECHNIQUE: Multiplanar, multisequence MR imaging  of the lumbar spine was performed. No intravenous contrast was administered. COMPARISON:  Radiography 03/24/2020 FINDINGS: Segmentation:  5 lumbar type vertebral bodies. Alignment:  Curvature convex to the right with the apex at L3. Vertebrae: No primary bone lesion. Discogenic endplate marrow changes at L2-3 which could relate to back pain. Conus medullaris and cauda equina: Conus extends to the T12-L1 level. Conus and cauda equina appear normal. Paraspinal and other soft tissues: Negative Disc levels: No abnormality at T12-L1 or L1-2. L2-3: Chronic disc degeneration with loss of disc height. Endplate osteophytes and bulging of the disc. Mild facet and ligamentous hypertrophy. No compressive narrowing of the canal or foramina. As noted above, there are discogenic endplate marrow changes at this level. Findings could relate to back pain. L3-4: Normal interspace. L4-5: Mild bulging of the disc. Bilateral facet osteoarthritis. No compressive stenosis. The facet arthropathy could be a cause of back pain or referred facet syndrome pain. L5-S1: Minimal bulging of the disc. No stenosis. Bilateral facet osteoarthritis which could be a cause of back pain or referred facet syndrome pain. IMPRESSION: Facet osteoarthritis at L4-5 and L5-S1  that could be a cause of back pain or referred facet syndrome pain. No apparent compressive stenosis at those 2 levels. L2-3 degenerative disc disease with loss of disc height, endplate osteophytes and bulging of the disc. Discogenic endplate marrow changes at this level. These findings could relate to back pain. There is no neural compressive stenosis. Electronically Signed   By: Nelson Chimes M.D.   On: 07/09/2020 14:51    Recent Labs: Lab Results  Component Value Date   WBC 6.3 03/24/2020   HGB 14.5 03/24/2020   PLT 267.0 03/24/2020   NA 137 03/24/2020   K 4.1 03/24/2020   CL 102 03/24/2020   CO2 25 03/24/2020   GLUCOSE 123 (H) 03/24/2020   BUN 12 03/24/2020   CREATININE 0.84 03/24/2020   BILITOT 0.5 03/24/2020   ALKPHOS 70 03/24/2020   AST 17 03/24/2020   ALT 13 03/24/2020   PROT 7.0 03/24/2020   ALBUMIN 4.4 03/24/2020   CALCIUM 10.1 03/24/2020    Speciality Comments: No specialty comments available.  Procedures:  Sacroiliac Joint Inj on 07/15/2020 9:37 AM Indications: pain Details: 27 G 1.5 in needle, posterior approach Medications: 40 mg triamcinolone acetonide 40 MG/ML; 1 mL lidocaine 1 % Aspirate: 0 mL Outcome: tolerated well, no immediate complications Procedure, treatment alternatives, risks and benefits explained, specific risks discussed. Consent was given by the patient. Immediately prior to procedure a time out was called to verify the correct patient, procedure, equipment, support staff and site/side marked as required. Patient was prepped and draped in the usual sterile fashion.    Allergies: Wellbutrin [bupropion] and Clindamycin hcl   Assessment / Plan:     Visit Diagnoses: Primary osteoarthritis of both hands-she continues to have pain and discomfort in the bilateral hands.  She states she has been getting CMC injections by Dr. Burney Gauze.  She has been using [place] brace.  She has been doing some exercises.  Trochanteric bursitis of both hips-she had tenderness  on palpation of bilateral trochanteric bursa consistent with trochanteric bursitis.  IT band stretches were given.  Primary osteoarthritis of both knees - Right knee moderate osteoarthritis, left knee severe osteoarthritis.  Bilateral moderate chondromalacia patella.  Followed by Dr. Durward Fortes.  Patient states that she went for a second opinion and was advised total knee replacement on the left side.  Primary osteoarthritis of both feet-she continues to have some  discomfort in her feet.  She states she had left second toe nail extraction which is a still painful.  Neuropathy of both feet-she continues to have discomfort in her feet due to neuropathy.  Chronic right SI joint pain-she had tenderness on palpation of her right SI joint which is causing her discomfort.  Different treatment options were discussed.  Have encouraged stretching and physical therapy.  As she had a lot of discomfort we discussed the option of cortisone injection.  She wanted to proceed with the cortisone injection.  The injection was performed as described above.  She tolerated the procedure well.  DDD (degenerative disc disease), lumbar-she has been having increased lower back pain.  I reviewed her x-rays of lumbar spine from February 2022 and MRI from June 2022.  X-rays were consistent with degenerative disc disease and facet joint arthropathy.  She will be going for physical therapy.  I have given her a handout on back exercises and some of the exercises were demonstrated in the office.  Osteopenia of multiple sites - Treated with Reclast in the past.  April 21, 2021DXA T score -1.3.  She gets DEXA scan through her PCP.  Controlled type 2 diabetes mellitus with diabetic polyneuropathy, without long-term current use of insulin (HCC)-have advised her to monitor blood sugar closely after the cortisone injection.  History of gastroesophageal reflux (GERD)  History of hyperlipidemia-dietary modifications and exercise was  emphasized.  History of skin cancer  Anxiety and depression  Other insomnia  Hx of colonic polyps  Orders: Orders Placed This Encounter  Procedures   Sacroiliac Joint Inj    No orders of the defined types were placed in this encounter.   Follow-Up Instructions: Return in about 6 months (around 01/14/2021) for OA, DDD.   Bo Merino, MD  Note - This record has been created using Editor, commissioning.  Chart creation errors have been sought, but may not always  have been located. Such creation errors do not reflect on  the standard of medical care.

## 2020-07-08 ENCOUNTER — Ambulatory Visit
Admission: RE | Admit: 2020-07-08 | Discharge: 2020-07-08 | Disposition: A | Discharge status: Home / Self Care | Payer: Medicare Other | Source: Ambulatory Visit | Attending: Family Medicine | Admitting: Family Medicine

## 2020-07-08 DIAGNOSIS — M47816 Spondylosis without myelopathy or radiculopathy, lumbar region: Secondary | ICD-10-CM

## 2020-07-08 DIAGNOSIS — M5136 Other intervertebral disc degeneration, lumbar region: Secondary | ICD-10-CM

## 2020-07-09 ENCOUNTER — Ambulatory Visit (INDEPENDENT_AMBULATORY_CARE_PROVIDER_SITE_OTHER): Payer: Medicare Other | Admitting: Podiatry

## 2020-07-09 ENCOUNTER — Other Ambulatory Visit: Payer: Self-pay

## 2020-07-09 DIAGNOSIS — E1142 Type 2 diabetes mellitus with diabetic polyneuropathy: Secondary | ICD-10-CM

## 2020-07-09 DIAGNOSIS — L603 Nail dystrophy: Secondary | ICD-10-CM | POA: Diagnosis not present

## 2020-07-13 ENCOUNTER — Ambulatory Visit: Payer: Medicare Other | Admitting: Orthopaedic Surgery

## 2020-07-13 ENCOUNTER — Other Ambulatory Visit: Payer: Self-pay | Admitting: *Deleted

## 2020-07-13 DIAGNOSIS — M545 Low back pain, unspecified: Secondary | ICD-10-CM

## 2020-07-15 ENCOUNTER — Encounter: Payer: Self-pay | Admitting: Podiatry

## 2020-07-15 ENCOUNTER — Ambulatory Visit (INDEPENDENT_AMBULATORY_CARE_PROVIDER_SITE_OTHER): Payer: Medicare Other | Admitting: Rheumatology

## 2020-07-15 ENCOUNTER — Encounter: Payer: Self-pay | Admitting: Rheumatology

## 2020-07-15 ENCOUNTER — Ambulatory Visit: Payer: Medicare Other | Admitting: Orthopaedic Surgery

## 2020-07-15 ENCOUNTER — Other Ambulatory Visit: Payer: Self-pay

## 2020-07-15 VITALS — BP 120/84 | HR 84 | Resp 13 | Ht 61.0 in | Wt 162.0 lb

## 2020-07-15 DIAGNOSIS — M533 Sacrococcygeal disorders, not elsewhere classified: Secondary | ICD-10-CM

## 2020-07-15 DIAGNOSIS — Z85828 Personal history of other malignant neoplasm of skin: Secondary | ICD-10-CM

## 2020-07-15 DIAGNOSIS — M19042 Primary osteoarthritis, left hand: Secondary | ICD-10-CM

## 2020-07-15 DIAGNOSIS — M170 Bilateral primary osteoarthritis of knee: Secondary | ICD-10-CM | POA: Diagnosis not present

## 2020-07-15 DIAGNOSIS — Z86010 Personal history of colonic polyps: Secondary | ICD-10-CM

## 2020-07-15 DIAGNOSIS — E1142 Type 2 diabetes mellitus with diabetic polyneuropathy: Secondary | ICD-10-CM

## 2020-07-15 DIAGNOSIS — G8929 Other chronic pain: Secondary | ICD-10-CM

## 2020-07-15 DIAGNOSIS — M7061 Trochanteric bursitis, right hip: Secondary | ICD-10-CM

## 2020-07-15 DIAGNOSIS — F32A Depression, unspecified: Secondary | ICD-10-CM

## 2020-07-15 DIAGNOSIS — F419 Anxiety disorder, unspecified: Secondary | ICD-10-CM

## 2020-07-15 DIAGNOSIS — M5136 Other intervertebral disc degeneration, lumbar region: Secondary | ICD-10-CM

## 2020-07-15 DIAGNOSIS — M19072 Primary osteoarthritis, left ankle and foot: Secondary | ICD-10-CM

## 2020-07-15 DIAGNOSIS — G4709 Other insomnia: Secondary | ICD-10-CM

## 2020-07-15 DIAGNOSIS — M19071 Primary osteoarthritis, right ankle and foot: Secondary | ICD-10-CM

## 2020-07-15 DIAGNOSIS — M8589 Other specified disorders of bone density and structure, multiple sites: Secondary | ICD-10-CM

## 2020-07-15 DIAGNOSIS — Z8719 Personal history of other diseases of the digestive system: Secondary | ICD-10-CM

## 2020-07-15 DIAGNOSIS — Z8639 Personal history of other endocrine, nutritional and metabolic disease: Secondary | ICD-10-CM

## 2020-07-15 DIAGNOSIS — G5793 Unspecified mononeuropathy of bilateral lower limbs: Secondary | ICD-10-CM

## 2020-07-15 DIAGNOSIS — M7062 Trochanteric bursitis, left hip: Secondary | ICD-10-CM

## 2020-07-15 DIAGNOSIS — M19041 Primary osteoarthritis, right hand: Secondary | ICD-10-CM | POA: Diagnosis not present

## 2020-07-15 MED ORDER — LIDOCAINE HCL 1 % IJ SOLN
1.0000 mL | INTRAMUSCULAR | Status: AC | PRN
  Administered 2020-07-15: 1 mL

## 2020-07-15 MED ORDER — TRIAMCINOLONE ACETONIDE 40 MG/ML IJ SUSP
40.0000 mg | INTRAMUSCULAR | Status: AC | PRN
  Administered 2020-07-15: 40 mg via INTRA_ARTICULAR

## 2020-07-15 NOTE — Patient Instructions (Signed)
Iliotibial Band Syndrome Rehab Ask your health care provider which exercises are safe for you. Do exercises exactly as told by your health care provider and adjust them as directed. It is normal to feel mild stretching, pulling, tightness, or discomfort as you do these exercises. Stop right away if you feel sudden pain or your pain gets significantly worse. Do not begin these exercises until told by your health care provider. Stretching and range-of-motion exercises These exercises warm up your muscles and joints and improve the movement andflexibility of your hip and pelvis. Quadriceps stretch, prone  Lie on your abdomen (prone position) on a firm surface, such as a bed or padded floor. Bend your left / right knee and reach back to hold your ankle or pant leg. If you cannot reach your ankle or pant leg, loop a belt around your foot and grab the belt instead. Gently pull your heel toward your buttocks. Your knee should not slide out to the side. You should feel a stretch in the front of your thigh and knee (quadriceps). Hold this position for __________ seconds. Repeat __________ times. Complete this exercise __________ times a day. Iliotibial band stretch An iliotibial band is a strong band of muscle tissue that runs from the outer side of your hip to the outer side of your thigh and knee. Lie on your side with your left / right leg in the top position. Bend both of your knees and grab your left / right ankle. Stretch out your bottom arm to help you balance. Slowly bring your top knee back so your thigh goes behind your trunk. Slowly lower your top leg toward the floor until you feel a gentle stretch on the outside of your left / right hip and thigh. If you do not feel a stretch and your knee will not fall farther, place the heel of your other foot on top of your knee and pull your knee down toward the floor with your foot. Hold this position for __________ seconds. Repeat __________ times.  Complete this exercise __________ times a day. Strengthening exercises These exercises build strength and endurance in your hip and pelvis. Enduranceis the ability to use your muscles for a long time, even after they get tired. Straight leg raises, side-lying This exercise strengthens the muscles that rotate the leg at the hip and move it away from your body (hip abductors). Lie on your side with your left / right leg in the top position. Lie so your head, shoulder, hip, and knee line up. You may bend your bottom knee to help you balance. Roll your hips slightly forward so your hips are stacked directly over each other and your left / right knee is facing forward. Tense the muscles in your outer thigh and lift your top leg 4-6 inches (10-15 cm). Hold this position for __________ seconds. Slowly lower your leg to return to the starting position. Let your muscles relax completely before doing another repetition. Repeat __________ times. Complete this exercise __________ times a day. Leg raises, prone This exercise strengthens the muscles that move the hips backward (hip extensors). Lie on your abdomen (prone position) on your bed or a firm surface. You can put a pillow under your hips if that is more comfortable for your lower back. Bend your left / right knee so your foot is straight up in the air. Squeeze your buttocks muscles and lift your left / right thigh off the bed. Do not let your back arch. Tense your thigh   muscle as hard as you can without increasing any knee pain. Hold this position for __________ seconds. Slowly lower your leg to return to the starting position and allow it to relax completely. Repeat __________ times. Complete this exercise __________ times a day. Hip hike Stand sideways on a bottom step. Stand on your left / right leg with your other foot unsupported next to the step. You can hold on to a railing or wall for balance if needed. Keep your knees straight and your  torso square. Then lift your left / right hip up toward the ceiling. Slowly let your left / right hip lower toward the floor, past the starting position. Your foot should get closer to the floor. Do not lean or bend your knees. Repeat __________ times. Complete this exercise __________ times a day. This information is not intended to replace advice given to you by your health care provider. Make sure you discuss any questions you have with your healthcare provider. Document Revised: 03/26/2019 Document Reviewed: 03/26/2019 Elsevier Patient Education  [place]. Back Exercises The following exercises strengthen the muscles that help to support the trunk and back. They also help to keep the lower back flexible. Doing these exercises can help to prevent back pain or lessen existing pain. If you have back pain or discomfort, try doing these exercises 2-3 times each day or as told by your health care provider. As your pain improves, do them once each day, but increase the number of times that you repeat the steps for each exercise (do more repetitions). To prevent the recurrence of back pain, continue to do these exercises once each day or as told by your health care provider. Do exercises exactly as told by your health care provider and adjust them as directed. It is normal to feel mild stretching, pulling, tightness, or discomfort as you do these exercises, but you should stop right away if youfeel sudden pain or your pain gets worse. Exercises Single knee to chest Repeat these steps 3-5 times for each leg: Lie on your back on a firm bed or the floor with your legs extended. Bring one knee to your chest. Your other leg should stay extended and in contact with the floor. Hold your knee in place by grabbing your knee or thigh with both hands and hold. Pull on your knee until you feel a gentle stretch in your lower back or buttocks. Hold the stretch for 10-30 seconds. Slowly release and  straighten your leg. Pelvic tilt Repeat these steps 5-10 times: Lie on your back on a firm bed or the floor with your legs extended. Bend your knees so they are pointing toward the ceiling and your feet are flat on the floor. Tighten your lower abdominal muscles to press your lower back against the floor. This motion will tilt your pelvis so your tailbone points up toward the ceiling instead of pointing to your feet or the floor. With gentle tension and even breathing, hold this position for 5-10 seconds. Cat-cow Repeat these steps until your lower back becomes more flexible: Get into a hands-and-knees position on a firm surface. Keep your hands under your shoulders, and keep your knees under your hips. You may place padding under your knees for comfort. Let your head hang down toward your chest. Contract your abdominal muscles and point your tailbone toward the floor so your lower back becomes rounded like the back of a cat. Hold this position for 5 seconds. Slowly lift your head, let  your abdominal muscles relax and point your tailbone up toward the ceiling so your back forms a sagging arch like the back of a cow. Hold this position for 5 seconds.  Press-ups Repeat these steps 5-10 times: Lie on your abdomen (face-down) on the floor. Place your palms near your head, about shoulder-width apart. Keeping your back as relaxed as possible and keeping your hips on the floor, slowly straighten your arms to raise the top half of your body and lift your shoulders. Do not use your back muscles to raise your upper torso. You may adjust the placement of your hands to make yourself more comfortable. Hold this position for 5 seconds while you keep your back relaxed. Slowly return to lying flat on the floor.  Bridges Repeat these steps 10 times: Lie on your back on a firm surface. Bend your knees so they are pointing toward the ceiling and your feet are flat on the floor. Your arms should be flat at  your sides, next to your body. Tighten your buttocks muscles and lift your buttocks off the floor until your waist is at almost the same height as your knees. You should feel the muscles working in your buttocks and the back of your thighs. If you do not feel these muscles, slide your feet 1-2 inches farther away from your buttocks. Hold this position for 3-5 seconds. Slowly lower your hips to the starting position, and allow your buttocks muscles to relax completely. If this exercise is too easy, try doing it with your arms crossed over yourchest. Abdominal crunches Repeat these steps 5-10 times: Lie on your back on a firm bed or the floor with your legs extended. Bend your knees so they are pointing toward the ceiling and your feet are flat on the floor. Cross your arms over your chest. Tip your chin slightly toward your chest without bending your neck. Tighten your abdominal muscles and slowly raise your trunk (torso) high enough to lift your shoulder blades a tiny bit off the floor. Avoid raising your torso higher than that because it can put too much stress on your low back and does not help to strengthen your abdominal muscles. Slowly return to your starting position. Back lifts Repeat these steps 5-10 times: Lie on your abdomen (face-down) with your arms at your sides, and rest your forehead on the floor. Tighten the muscles in your legs and your buttocks. Slowly lift your chest off the floor while you keep your hips pressed to the floor. Keep the back of your head in line with the curve in your back. Your eyes should be looking at the floor. Hold this position for 3-5 seconds. Slowly return to your starting position. Contact a health care provider if: Your back pain or discomfort gets much worse when you do an exercise. Your worsening back pain or discomfort does not lessen within 2 hours after you exercise. If you have any of these problems, stop doing these exercises right away. Do  not do them again unless your health care provider says that you can. Get help right away if: You develop sudden, severe back pain. If this happens, stop doing the exercises right away. Do not do them again unless your health care provider says that you can. This information is not intended to replace advice given to you by your health care provider. Make sure you discuss any questions you have with your healthcare provider. Document Revised: 05/23/2018 Document Reviewed: 10/18/2017 Elsevier Patient Education  2022 [employer]  
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
## 2020-07-20 ENCOUNTER — Ambulatory Visit: Payer: Medicare Other | Admitting: Family Medicine

## 2020-07-21 ENCOUNTER — Ambulatory Visit: Payer: Medicare Other | Admitting: Family Medicine

## 2020-07-26 ENCOUNTER — Ambulatory Visit: Payer: Medicare Other | Admitting: Family Medicine

## 2020-07-27 ENCOUNTER — Ambulatory Visit: Payer: Medicare Other | Admitting: Orthopaedic Surgery

## 2020-08-05 ENCOUNTER — Other Ambulatory Visit: Payer: Self-pay

## 2020-08-05 ENCOUNTER — Ambulatory Visit (INDEPENDENT_AMBULATORY_CARE_PROVIDER_SITE_OTHER): Payer: Medicare Other | Admitting: Family Medicine

## 2020-08-05 ENCOUNTER — Encounter: Payer: Self-pay | Admitting: Family Medicine

## 2020-08-05 VITALS — BP 112/80 | HR 98 | Temp 97.9°F | Wt 160.2 lb

## 2020-08-05 DIAGNOSIS — R229 Localized swelling, mass and lump, unspecified: Secondary | ICD-10-CM

## 2020-08-05 NOTE — Progress Notes (Signed)
Subjective:    Patient ID: Renee Bauer, female    DOB: 01-Jan-1951, 70 y.o.   MRN: [id number]  Chief Complaint  Patient presents with   Mass    On upper lip, noticed 2 days ago. Thought it was a pimple. States it feels like a lump under the skin, hard, tiny like a pea. Tender to the touch.    HPI Patient was seen today for acute concern.  Pt noticed a bump on R upper lip 2 days ago, that is not visible, but can be felt under the skin.  Area is TTP, hard like a pea.  Denies erythema, drainage, dental pain, h/o tobacco use, changes in meds or foods.     Past Medical History:  Diagnosis Date   Anxiety    Arthritis    Benign colon polyp 2018   Bursitis    Depression    Diabetes mellitus without complication (HCC)    GERD (gastroesophageal reflux disease)    Hyperlipidemia    Rosacea     Allergies  Allergen Reactions   Wellbutrin [Bupropion]     Muscle twitches   Clindamycin Hcl Rash    ROS General: Denies fever, chills, night sweats, changes in weight, changes in appetite HEENT: Denies headaches, ear pain, changes in vision, rhinorrhea, sore throat CV: Denies CP, palpitations, SOB, orthopnea Pulm: Denies SOB, cough, wheezing GI: Denies abdominal pain, nausea, vomiting, diarrhea, constipation GU: Denies dysuria, hematuria, frequency, vaginal discharge Msk: Denies muscle cramps, joint pains Neuro: Denies weakness, numbness, tingling Skin: Denies rashes, bruising +bump in skin of upper lip Psych: Denies depression, anxiety, hallucinations     Objective:    Blood pressure 112/80, pulse 98, temperature 97.9 F (36.6 C), temperature source Oral, weight 160 lb 3.2 oz (72.7 kg), SpO2 96 %.  Gen. Pleasant, well-nourished, in no distress, normal affect   HEENT: [place]/AT, face symmetric, conjunctiva clear, no scleral icterus, PERRLA, EOMI, nares patent without drainage, No TTP of salivary glands, no oral lesions or gingival edema.  Pharynx without erythema or exudate. Lungs: no  accessory muscle use Cardiovascular: RRR, no m/r/g, no peripheral edema Musculoskeletal: No deformities, no cyanosis or clubbing, normal tone Neuro:  A&Ox3, CN II-XII intact, normal gait Skin:  Warm, no lesions/ rash.  A 5 mm smooth, round, firm, mobile, nodule in palpated in skin above R side of upper lip, mildly tender, not visible on inspection.      Wt Readings from Last 3 Encounters:  08/05/20 160 lb 3.2 oz (72.7 kg)  07/15/20 162 lb (73.5 kg)  06/23/20 164 lb 8 oz (74.6 kg)    Lab Results  Component Value Date   WBC 6.3 03/24/2020   HGB 14.5 03/24/2020   HCT 42.8 03/24/2020   PLT 267.0 03/24/2020   GLUCOSE 123 (H) 03/24/2020   CHOL 133 03/24/2020   TRIG 41.0 03/24/2020   HDL 68.40 03/24/2020   LDLCALC 56 03/24/2020   ALT 13 03/24/2020   AST 17 03/24/2020   NA 137 03/24/2020   K 4.1 03/24/2020   CL 102 03/24/2020   CREATININE 0.84 03/24/2020   BUN 12 03/24/2020   CO2 25 03/24/2020   TSH 1.84 11/04/2018   HGBA1C 5.7 (A) 06/23/2020   MICROALBUR 2.4 (H) 03/24/2020    Assessment/Plan:  Skin nodule -discussed possible causes including cyst, calcification -warm compresses advised -tylenol prn for pain/discomfort -for continued or worsened symptoms f/u in 1-2 wks for further eval such as bx.  F/u prn  Grier Mitts, MD

## 2020-08-09 ENCOUNTER — Telehealth: Payer: Medicare Other | Admitting: Family Medicine

## 2020-08-30 ENCOUNTER — Telehealth: Payer: Self-pay | Admitting: Family Medicine

## 2020-08-30 ENCOUNTER — Encounter: Payer: Self-pay | Admitting: Family Medicine

## 2020-08-30 ENCOUNTER — Other Ambulatory Visit: Payer: Self-pay | Admitting: Podiatry

## 2020-08-30 NOTE — Telephone Encounter (Signed)
Pt wanted to know if Dr Ethlyn Gallery check the my chart message she sent

## 2020-08-30 NOTE — Telephone Encounter (Signed)
Please advise 

## 2020-08-30 NOTE — Telephone Encounter (Signed)
Message sent through [facility] and patient called by provider.

## 2020-08-31 ENCOUNTER — Other Ambulatory Visit: Payer: Self-pay | Admitting: Family Medicine

## 2020-08-31 DIAGNOSIS — Z1231 Encounter for screening mammogram for malignant neoplasm of breast: Secondary | ICD-10-CM

## 2020-09-03 MED ORDER — FLUVOXAMINE MALEATE 25 MG PO TABS: 25.0000 mg | ORAL_TABLET | Freq: Every day | ORAL | 1 refills | Status: DC

## 2020-09-03 NOTE — Telephone Encounter (Signed)
Patient called about refill request  Please advise

## 2020-09-10 ENCOUNTER — Other Ambulatory Visit: Payer: Self-pay

## 2020-09-10 ENCOUNTER — Ambulatory Visit (INDEPENDENT_AMBULATORY_CARE_PROVIDER_SITE_OTHER): Payer: Medicare Other | Admitting: Podiatry

## 2020-09-10 DIAGNOSIS — M79675 Pain in left toe(s): Secondary | ICD-10-CM

## 2020-09-10 DIAGNOSIS — B351 Tinea unguium: Secondary | ICD-10-CM | POA: Diagnosis not present

## 2020-09-10 DIAGNOSIS — E1142 Type 2 diabetes mellitus with diabetic polyneuropathy: Secondary | ICD-10-CM | POA: Diagnosis not present

## 2020-09-10 DIAGNOSIS — M79674 Pain in right toe(s): Secondary | ICD-10-CM

## 2020-09-10 NOTE — Progress Notes (Signed)
  Subjective:  Patient ID: Renee Bauer, female    DOB: 01/04/1951,  MRN: [id number]  Chief Complaint  Patient presents with   Nail Problem    3 month routine foot care nail trim    70 y.o. female returns for the above complaint.  Patient presents with thickened elongated dystrophic toenails x10.  Pain full to touch.  She is a diabetic with last A1c of 5.7.  She would like to have her nails debrided down.  She denies any other acute complaints.  Objective:  There were no vitals filed for this visit. Podiatric Exam: Vascular: dorsalis pedis and posterior tibial pulses are palpable bilateral. Capillary return is immediate. Temperature gradient is WNL. Skin turgor WNL  Sensorium: Normal Semmes Weinstein monofilament test. Normal tactile sensation bilaterally. Nail Exam: Pt has thick disfigured discolored nails with subungual debris noted bilateral entire nail hallux through fifth toenails.  Except left second digit due to previous total nail avulsion.  Pain on palpation to the nails. Ulcer Exam: There is no evidence of ulcer or pre-ulcerative changes or infection. Orthopedic Exam: Muscle tone and strength are WNL. No limitations in general ROM. No crepitus or effusions noted. HAV  B/L.  Hammer toes 2-5  B/L. Skin: No Porokeratosis. No infection or ulcers    Assessment & Plan:   1. Pain due to onychomycosis of toenails of both feet   2. Controlled type 2 diabetes mellitus with diabetic polyneuropathy, without long-term current use of insulin ([place])     Patient was evaluated and treated and all questions answered.  Onychomycosis with pain  -Nails palliatively debrided as below. -Educated on self-care  Procedure: Nail Debridement Rationale: pain  Type of Debridement: manual, sharp debridement. Instrumentation: Nail nipper, rotary burr. Number of Nails: 9  Procedures and Treatment: Consent by patient was obtained for treatment procedures. The patient understood the discussion of  treatment and procedures well. All questions were answered thoroughly reviewed. Debridement of mycotic and hypertrophic toenails, 1 through 5 bilateral and clearing of subungual debris. No ulceration, no infection noted.  Return Visit-Office Procedure: Patient instructed to return to the office for a follow up visit 3 months for continued evaluation and treatment.  Boneta Lucks, DPM    No follow-ups on file.

## 2020-09-19 ENCOUNTER — Encounter: Payer: Self-pay | Admitting: Family Medicine

## 2020-09-20 ENCOUNTER — Other Ambulatory Visit: Payer: Self-pay | Admitting: Family Medicine

## 2020-09-20 DIAGNOSIS — M2550 Pain in unspecified joint: Secondary | ICD-10-CM

## 2020-09-20 MED ORDER — FLUVOXAMINE MALEATE 25 MG PO TABS: 25.0000 mg | ORAL_TABLET | Freq: Every day | ORAL | 0 refills | Status: DC

## 2020-09-29 HISTORY — PX: TOTAL KNEE ARTHROPLASTY: SHX125

## 2020-10-11 ENCOUNTER — Ambulatory Visit: Payer: Medicare Other | Admitting: Family Medicine

## 2020-10-12 ENCOUNTER — Encounter: Payer: Self-pay | Admitting: Family Medicine

## 2020-11-10 ENCOUNTER — Ambulatory Visit: Payer: Medicare Other | Admitting: Family Medicine

## 2020-11-10 ENCOUNTER — Ambulatory Visit: Payer: Medicare Other

## 2020-11-17 ENCOUNTER — Other Ambulatory Visit: Payer: Self-pay

## 2020-11-17 ENCOUNTER — Encounter: Payer: Self-pay | Admitting: Family Medicine

## 2020-11-17 ENCOUNTER — Ambulatory Visit (INDEPENDENT_AMBULATORY_CARE_PROVIDER_SITE_OTHER): Payer: Medicare Other | Admitting: Family Medicine

## 2020-11-17 VITALS — BP 112/80 | HR 78 | Temp 98.1°F | Ht 61.0 in | Wt 157.0 lb

## 2020-11-17 DIAGNOSIS — G4709 Other insomnia: Secondary | ICD-10-CM

## 2020-11-17 DIAGNOSIS — E785 Hyperlipidemia, unspecified: Secondary | ICD-10-CM | POA: Diagnosis not present

## 2020-11-17 DIAGNOSIS — F419 Anxiety disorder, unspecified: Secondary | ICD-10-CM

## 2020-11-17 DIAGNOSIS — R739 Hyperglycemia, unspecified: Secondary | ICD-10-CM

## 2020-11-17 MED ORDER — MONTELUKAST SODIUM 10 MG PO TABS: 10.0000 mg | ORAL_TABLET | Freq: Every day | ORAL | 3 refills | Status: DC

## 2020-11-17 NOTE — Patient Instructions (Signed)
Stop the crestor and see how you feel in the next 2-3 weeks. If aching goes away, let me know. If aching goes away; I suggest restarting at half tablet (5mg ) dosing and we can recheck in 3 months lipids. You could always return to previous dose since numbers are well controlled now.

## 2020-11-17 NOTE — Progress Notes (Signed)
RANIAH KARAN DOB: 01-05-1951 Encounter date: 11/17/2020  This is a 70 y.o. female who presents with Chief Complaint  Patient presents with   Follow-up    History of present illness: 7 weeks out from left total knee. PT twice weekly, exercises at home and up to 0.68miles walking. Still hurts and some burning sensation. Feels like some of the phq9 responses are related to her limitations with knee. Making good progress. First two weeks were hard.  Hasn't paid too much attention to her lower back. Not much changed.   Being on tylenol has taken away some of appetite. Stopped pain medication after 4 days due to constipation. Mindful of what she is eating because didn't want to gain weight. Takes advil if needed in between tylenol.   Since surgery has had issues with bowels. Even before surgery was taking metamucil daily. Has been taking miralax - putting it in with metamucil which is working well for her. Used milk of magnesia one time which helped.   Wanted upper lip looked at. Saw Dr. Volanda Napoleon - had some bumps in upper lip - just wants to make sure these aren't worrisome.   Did get home call nurse re absent left achilles reflex - she states visit was 3 weeks after knee surgery.   Getting singulair from allergist - suggested getting from me so that she doesn't have to go back there except year.   Wondering what she is on crestor for. Wonders if it causes aches/pains in joints.   The 10-year ASCVD risk score (Arnett DK, et al., 2019) is: 9%   Values used to calculate the score:     Age: 43 years     Sex: Female     Is Non-Hispanic African American: No     Diabetic: Yes     Tobacco smoker: No     Systolic Blood Pressure: 371 mmHg     Is BP treated: No     HDL Cholesterol: 68.4 mg/dL     Total Cholesterol: 133 mg/dL  Hearing some clicking in ears; wanted these checked.   Allergies  Allergen Reactions   Wellbutrin [Bupropion]     Muscle twitches   Clindamycin Hcl Rash   Current  Meds  Medication Sig   ALPRAZolam (XANAX) 1 MG tablet Take 1 tablet (1 mg total) by mouth as needed for anxiety.   Azelastine & Fluticasone 137 & 50 MCG/ACT THPK Place 1 spray into the nose in the morning and at bedtime.   betamethasone acetate-betamethasone sodium phosphate (CELESTONE) 6 (3-3) MG/ML injection Inject into the articular space.   CALCIUM CITRATE PO Take 600 mg by mouth daily.   Cholecalciferol (VITAMIN D3 PO) Take 2,000 Units by mouth daily.   esomeprazole (NEXIUM) 40 MG capsule TAKE ONE CAPSULE BY MOUTH DAILY   fluvoxaMINE (LUVOX) 25 MG tablet Take 1 tablet (25 mg total) by mouth at bedtime.   gabapentin (NEURONTIN) 100 MG capsule TAKE 1 CAPSULE THREE TIMES A DAY   lidocaine (XYLOCAINE) 1 % (with preservative) injection by Infiltration route.   meloxicam (MOBIC) 7.5 MG tablet TAKE ONE TABLET BY MOUTH TWICE A DAY AS NEEDED FOR PAIN   METRONIDAZOLE, TOPICAL, 0.75 % LOTN as needed.    montelukast (SINGULAIR) 10 MG tablet Take 1 tablet (10 mg total) by mouth at bedtime.   OVER THE COUNTER MEDICATION Zeasorb powder   rosuvastatin (CRESTOR) 10 MG tablet TAKE 1 TABLET AT BEDTIME   TERBINAFINE EX Apply topically.   TURMERIC CURCUMIN PO Take  500 mg by mouth daily.   vitamin B-12 (CYANOCOBALAMIN) 1000 MCG tablet Take 1,000 mcg by mouth daily.   zolpidem (AMBIEN CR) 6.25 MG CR tablet Take 1 tablet (6.25 mg total) by mouth at bedtime as needed for sleep.    Review of Systems  Constitutional:  Negative for chills, fatigue and fever.  Respiratory:  Negative for cough, chest tightness, shortness of breath and wheezing.   Cardiovascular:  Negative for chest pain, palpitations and leg swelling.   Objective:  BP 112/80 (BP Location: Left Arm, Patient Position: Sitting, Cuff Size: Normal)   Pulse 78   Temp 98.1 F (36.7 C) (Oral)   Ht 5\' 1"  (1.549 m)   Wt 157 lb (71.2 kg)   SpO2 98%   BMI 29.66 kg/m   Weight: 157 lb (71.2 kg)   BP Readings from Last 3 Encounters:  11/17/20 112/80   08/05/20 112/80  07/15/20 120/84   Wt Readings from Last 3 Encounters:  11/17/20 157 lb (71.2 kg)  08/05/20 160 lb 3.2 oz (72.7 kg)  07/15/20 162 lb (73.5 kg)    Physical Exam Constitutional:      General: She is not in acute distress.    Appearance: She is well-developed.  HENT:     Head:     Comments: Mobile cystic-feeling nodules upper lip x 3 all smaller than 0.25cm, nontender. These are not visible.  Cardiovascular:     Rate and Rhythm: Normal rate and regular rhythm.     Heart sounds: Normal heart sounds. No murmur heard.   No friction rub.  Pulmonary:     Effort: Pulmonary effort is normal. No respiratory distress.     Breath sounds: Normal breath sounds. No wheezing or rales.  Musculoskeletal:     Right lower leg: No edema.     Left lower leg: No edema.  Neurological:     Mental Status: She is alert and oriented to person, place, and time.  Psychiatric:        Behavior: Behavior normal.    Assessment/Plan 1. Hyperglycemia - Comprehensive metabolic panel; Future - Hemoglobin A1c; Future  2. Other insomnia She is able to sleep with ambient or xanax. Alternates and does not take something nightly.  - CBC with Differential/Platelet; Future  3. Anxiety Stable on fluvoxamine   4. Hyperlipidemia, unspecified hyperlipidemia type She worries about aching/cramping with crestor.  Advised to hold the Crestor for the next 2 to 3 weeks and let me know what happens with aching.  If aching goes away, suggest restarting with half a tablet of Crestor. - Lipid panel; Future  No follow-ups on file.       Micheline Rough, MD

## 2020-11-19 MED ORDER — LIDOCAINE-PRILOCAINE 2.5-2.5 % EX CREA: 1.0000 "application " | TOPICAL_CREAM | CUTANEOUS | 0 refills | Status: DC | PRN

## 2020-11-19 NOTE — Telephone Encounter (Signed)
Spoke with Gwinda Passe, pharmacist at [facility] to call in the Rx as below.  Gwinda Passe stated no compounded Rx is required as this is available via commercial pharmacy as Emla cream.  Rx was sent to [facility] and the patient informed via Mychart message.

## 2020-12-01 ENCOUNTER — Other Ambulatory Visit: Payer: Self-pay

## 2020-12-01 ENCOUNTER — Other Ambulatory Visit (INDEPENDENT_AMBULATORY_CARE_PROVIDER_SITE_OTHER): Payer: Medicare Other

## 2020-12-01 DIAGNOSIS — E785 Hyperlipidemia, unspecified: Secondary | ICD-10-CM

## 2020-12-01 DIAGNOSIS — G4709 Other insomnia: Secondary | ICD-10-CM

## 2020-12-01 DIAGNOSIS — R739 Hyperglycemia, unspecified: Secondary | ICD-10-CM | POA: Diagnosis not present

## 2020-12-01 LAB — CBC WITH DIFFERENTIAL/PLATELET
Basophils Absolute: 0 10*3/uL (ref 0.0–0.1)
Basophils Relative: 0.6 % (ref 0.0–3.0)
Eosinophils Absolute: 0.2 10*3/uL (ref 0.0–0.7)
Eosinophils Relative: 2.7 % (ref 0.0–5.0)
HCT: 41.5 % (ref 36.0–46.0)
Hemoglobin: 13.7 g/dL (ref 12.0–15.0)
Lymphocytes Relative: 23.3 % (ref 12.0–46.0)
Lymphs Abs: 1.7 10*3/uL (ref 0.7–4.0)
MCHC: 32.9 g/dL (ref 30.0–36.0)
MCV: 88.9 fl (ref 78.0–100.0)
Monocytes Absolute: 0.5 10*3/uL (ref 0.1–1.0)
Monocytes Relative: 6.2 % (ref 3.0–12.0)
Neutro Abs: 5 10*3/uL (ref 1.4–7.7)
Neutrophils Relative %: 67.2 % (ref 43.0–77.0)
Platelets: 265 10*3/uL (ref 150.0–400.0)
RBC: 4.67 Mil/uL (ref 3.87–5.11)
RDW: 13.4 % (ref 11.5–15.5)
WBC: 7.4 10*3/uL (ref 4.0–10.5)

## 2020-12-01 LAB — COMPREHENSIVE METABOLIC PANEL
ALT: 9 U/L (ref 0–35)
AST: 14 U/L (ref 0–37)
Albumin: 4.2 g/dL (ref 3.5–5.2)
Alkaline Phosphatase: 72 U/L (ref 39–117)
BUN: 12 mg/dL (ref 6–23)
CO2: 29 mEq/L (ref 19–32)
Calcium: 9.8 mg/dL (ref 8.4–10.5)
Chloride: 104 mEq/L (ref 96–112)
Creatinine, Ser: 0.78 mg/dL (ref 0.40–1.20)
GFR: 78.15 mL/min (ref >60.00)
Glucose, Bld: 99 mg/dL (ref 70–99)
Potassium: 4 mEq/L (ref 3.5–5.1)
Sodium: 140 mEq/L (ref 135–145)
Total Bilirubin: 0.4 mg/dL (ref 0.2–1.2)
Total Protein: 6.8 g/dL (ref 6.0–8.3)

## 2020-12-01 LAB — LIPID PANEL
Cholesterol: 220 mg/dL — ABNORMAL HIGH (ref 0–200)
HDL: 49.8 mg/dL (ref >39.00)
LDL Cholesterol: 138 mg/dL — ABNORMAL HIGH (ref 0–99)
NonHDL: 170.62
Total CHOL/HDL Ratio: 4
Triglycerides: 165 mg/dL — ABNORMAL HIGH (ref 0.0–149.0)
VLDL: 33 mg/dL (ref 0.0–40.0)

## 2020-12-01 LAB — HEMOGLOBIN A1C: Hgb A1c MFr Bld: 5.8 % (ref 4.6–6.5)

## 2020-12-07 ENCOUNTER — Ambulatory Visit
Admission: RE | Admit: 2020-12-07 | Discharge: 2020-12-07 | Disposition: A | Discharge status: Home / Self Care | Payer: Medicare Other | Source: Ambulatory Visit | Attending: Family Medicine | Admitting: Family Medicine

## 2020-12-07 ENCOUNTER — Other Ambulatory Visit: Payer: Self-pay

## 2020-12-07 DIAGNOSIS — Z1231 Encounter for screening mammogram for malignant neoplasm of breast: Secondary | ICD-10-CM

## 2020-12-09 ENCOUNTER — Encounter: Payer: Self-pay | Admitting: Family Medicine

## 2020-12-12 ENCOUNTER — Other Ambulatory Visit: Payer: Self-pay | Admitting: Family Medicine

## 2020-12-12 DIAGNOSIS — M2550 Pain in unspecified joint: Secondary | ICD-10-CM

## 2020-12-13 ENCOUNTER — Other Ambulatory Visit: Payer: Self-pay | Admitting: Family Medicine

## 2020-12-14 MED ORDER — MELOXICAM 7.5 MG PO TABS: 7.5000 mg | ORAL_TABLET | Freq: Two times a day (BID) | ORAL | 0 refills | Status: DC | PRN

## 2020-12-15 ENCOUNTER — Ambulatory Visit (INDEPENDENT_AMBULATORY_CARE_PROVIDER_SITE_OTHER): Payer: Medicare Other | Admitting: Podiatry

## 2020-12-15 ENCOUNTER — Other Ambulatory Visit: Payer: Self-pay

## 2020-12-15 DIAGNOSIS — M79674 Pain in right toe(s): Secondary | ICD-10-CM | POA: Diagnosis not present

## 2020-12-15 DIAGNOSIS — E1142 Type 2 diabetes mellitus with diabetic polyneuropathy: Secondary | ICD-10-CM | POA: Diagnosis not present

## 2020-12-15 DIAGNOSIS — M79675 Pain in left toe(s): Secondary | ICD-10-CM

## 2020-12-15 DIAGNOSIS — B351 Tinea unguium: Secondary | ICD-10-CM | POA: Diagnosis not present

## 2020-12-16 ENCOUNTER — Encounter: Payer: Self-pay | Admitting: Podiatry

## 2020-12-16 NOTE — Progress Notes (Signed)
  Subjective:  Patient ID: Renee Bauer, female    DOB: 11/10/1950,  MRN: [id number]  Chief Complaint  Patient presents with   Nail Problem    Nail trim    70 y.o. female returns for the above complaint.  Patient presents with thickened elongated dystrophic toenails x10.  Pain full to touch.  She is a diabetic with last A1c of 5.7.  She would like to have her nails debrided down.  She denies any other acute complaints.  Objective:  There were no vitals filed for this visit. Podiatric Exam: Vascular: dorsalis pedis and posterior tibial pulses are palpable bilateral. Capillary return is immediate. Temperature gradient is WNL. Skin turgor WNL  Sensorium: Normal Semmes Weinstein monofilament test. Normal tactile sensation bilaterally. Nail Exam: Pt has thick disfigured discolored nails with subungual debris noted bilateral entire nail hallux through fifth toenails.  Except left second digit due to previous total nail avulsion.  Pain on palpation to the nails. Ulcer Exam: There is no evidence of ulcer or pre-ulcerative changes or infection. Orthopedic Exam: Muscle tone and strength are WNL. No limitations in general ROM. No crepitus or effusions noted. HAV  B/L.  Hammer toes 2-5  B/L. Skin: No Porokeratosis. No infection or ulcers    Assessment & Plan:   No diagnosis found.   Patient was evaluated and treated and all questions answered.  Onychomycosis with pain  -Nails palliatively debrided as below. -Educated on self-care  Procedure: Nail Debridement Rationale: pain  Type of Debridement: manual, sharp debridement. Instrumentation: Nail nipper, rotary burr. Number of Nails: 9  Procedures and Treatment: Consent by patient was obtained for treatment procedures. The patient understood the discussion of treatment and procedures well. All questions were answered thoroughly reviewed. Debridement of mycotic and hypertrophic toenails, 1 through 5 bilateral and clearing of subungual debris.  No ulceration, no infection noted.  Return Visit-Office Procedure: Patient instructed to return to the office for a follow up visit 3 months for continued evaluation and treatment.  Boneta Lucks, DPM    No follow-ups on file.

## 2021-01-02 ENCOUNTER — Encounter: Payer: Self-pay | Admitting: Family Medicine

## 2021-01-03 MED ORDER — ESOMEPRAZOLE MAGNESIUM 40 MG PO CPDR
40.0000 mg | DELAYED_RELEASE_CAPSULE | Freq: Every day | ORAL | 1 refills | Status: DC
Start: 2021-01-03 — End: 2021-06-28

## 2021-01-20 NOTE — Progress Notes (Signed)
Office Visit Note  Patient: Renee Bauer             Date of Birth: [birth year]           MRN: [id number]             PCP: Caren Macadam, MD Referring: Caren Macadam, MD Visit Date: 02/03/2021 Occupation: @GUAROCC @  Subjective:  Lower back pain   History of Present Illness: Renee Bauer is a 70 y.o. female with a history of osteoarthritis and degenerative disease of lumbar spine.  She underwent left total knee replacement in August 2022 by Dr. Lorre Nick.  She states she is recovering well from the knee replacement.  She continues to have some pain and stiffness in her hands.  She recently had left CMC injection by Dr. Burney Gauze.  She is also using a brace.  She continues to have lower back pain and SI joint discomfort.  She also has some discomfort in her feet off and on.  She continues to have symptoms of neuropathy.  Activities of Daily Living:  Patient reports morning stiffness for 10-15 minutes.   Patient Reports nocturnal pain.  Difficulty dressing/grooming: Denies Difficulty climbing stairs: Reports Difficulty getting out of chair: Denies Difficulty using hands for taps, buttons, cutlery, and/or writing: Reports  Review of Systems  Constitutional:  Negative for fatigue.  HENT:  Positive for mouth dryness. Negative for mouth sores and nose dryness.   Eyes:  Negative for pain, itching and dryness.  Respiratory:  Negative for shortness of breath and difficulty breathing.   Cardiovascular:  Negative for chest pain and palpitations.  Gastrointestinal:  Positive for constipation. Negative for blood in stool and diarrhea.  Endocrine: Negative for increased urination.  Genitourinary:  Negative for difficulty urinating.  Musculoskeletal:  Positive for joint pain, joint pain, myalgias, morning stiffness, muscle tenderness and myalgias. Negative for joint swelling.  Skin:  Negative for color change, rash and redness.  Allergic/Immunologic: Negative for susceptible to  infections.  Neurological:  Positive for numbness. Negative for dizziness, headaches, memory loss and weakness.  Hematological:  Negative for bruising/bleeding tendency.  Psychiatric/Behavioral:  Negative for confusion.    PMFS History:  Patient Active Problem List   Diagnosis Date Noted   Primary osteoarthritis of both hands 07/06/2019   Primary osteoarthritis of both knees 07/06/2019   Unilateral primary osteoarthritis, left knee 10/31/2018   Arthritis 10/30/2018   DM (diabetes mellitus) type II controlled, neurological manifestation ([place]) 10/30/2018   Neuropathy of both feet 10/30/2018   Anxiety 10/30/2018   Insomnia 10/30/2018   History of skin cancer 10/30/2018   Osteopenia 09/30/2018    Past Medical History:  Diagnosis Date   Anxiety    Arthritis    Benign colon polyp 2018   Bursitis    Depression    Diabetes mellitus without complication (HCC)    GERD (gastroesophageal reflux disease)    Hyperlipidemia    Rosacea     Family History  Problem Relation Age of Onset   Diabetes Mother    Arrhythmia Mother    High blood pressure Mother    Mesothelioma Father        asbestos exposure   Allergic rhinitis Father    Sinusitis Father    Healthy Sister    Brain cancer Maternal Aunt    Post-traumatic stress disorder Son    Scoliosis Son    Asthma Neg Hx    Eczema Neg Hx    Urticaria Neg Hx  Angioedema Neg Hx    Immunodeficiency Neg Hx    Atopy Neg Hx    Past Surgical History:  Procedure Laterality Date   ABDOMINAL HYSTERECTOMY     menorrhagia   BLADDER REPAIR     BREAST BIOPSY Left    BUNIONECTOMY Right    COLON RESECTION  2007   DILATION AND CURETTAGE OF UTERUS     TOTAL KNEE ARTHROPLASTY Left 09/29/2020   Social History   Social History Narrative   Not on file   Immunization History  Administered Date(s) Administered   Fluad Quad(high Dose 65+) 11/04/2018   Moderna Sars-Covid-2 Vaccination 06/10/2019, 07/06/2019   Pneumococcal Conjugate-13  11/04/2018   Td 07/19/2016   Zoster Recombinat (Shingrix) 06/10/2020     Objective: Vital Signs: BP 120/84 (BP Location: Left Arm, Patient Position: Sitting, Cuff Size: Normal)    Pulse 74    Ht 5' 1.5" (1.562 m)    Wt 161 lb 9.6 oz (73.3 kg)    BMI 30.04 kg/m    Physical Exam Vitals and nursing note reviewed.  Constitutional:      Appearance: She is well-developed.  HENT:     Head: Normocephalic and atraumatic.  Eyes:     Conjunctiva/sclera: Conjunctivae normal.  Cardiovascular:     Rate and Rhythm: Normal rate and regular rhythm.     Heart sounds: Normal heart sounds.  Pulmonary:     Effort: Pulmonary effort is normal.     Breath sounds: Normal breath sounds.  Abdominal:     General: Bowel sounds are normal.     Palpations: Abdomen is soft.  Musculoskeletal:     Cervical back: Normal range of motion.  Lymphadenopathy:     Cervical: No cervical adenopathy.  Skin:    General: Skin is warm and dry.     Capillary Refill: Capillary refill takes less than 2 seconds.  Neurological:     Mental Status: She is alert and oriented to person, place, and time.  Psychiatric:        Behavior: Behavior normal.     Musculoskeletal Exam: C-spine was in good range of motion.  She had good range of motion of the lumbar spine with some discomfort in the lower lumbar region.  She had no SI joint tenderness today.  Shoulder joints, elbow joints, wrist joints, MCPs PIPs and DIPs with good range of motion.  She had bilateral PIP and DIP thickening with no synovitis.  Hip joints and knee joints with good range of motion.  Left knee joint was replaced and had some warmth on palpation.  There was no tenderness over ankles or MTPs.  CDAI Exam: CDAI Score: -- Patient Global: --; Provider Global: -- Swollen: --; Tender: -- Joint Exam 02/03/2021   No joint exam has been documented for this visit   There is currently no information documented on the homunculus. Go to the Rheumatology activity and  complete the homunculus joint exam.  Investigation: No additional findings.  Imaging: No results found.  Recent Labs: Lab Results  Component Value Date   WBC 7.4 12/01/2020   HGB 13.7 12/01/2020   PLT 265.0 12/01/2020   NA 140 12/01/2020   K 4.0 12/01/2020   CL 104 12/01/2020   CO2 29 12/01/2020   GLUCOSE 99 12/01/2020   BUN 12 12/01/2020   CREATININE 0.78 12/01/2020   BILITOT 0.4 12/01/2020   ALKPHOS 72 12/01/2020   AST 14 12/01/2020   ALT 9 12/01/2020   PROT 6.8 12/01/2020   ALBUMIN  4.2 12/01/2020   CALCIUM 9.8 12/01/2020    Speciality Comments: No specialty comments available.  Procedures:  No procedures performed Allergies: Tramadol, Wellbutrin [bupropion], and Clindamycin hcl   Assessment / Plan:     Visit Diagnoses: Primary osteoarthritis of both hands -she had bilateral PIP and DIP thickening.  She had to have a left CMC injection by Dr. Burney Gauze recently.  Trochanteric bursitis of both hips-she continues to have some tenderness over trochanteric bursa.  IT band stretches were discussed.  Primary osteoarthritis of right knee - Right knee moderate osteoarthritis,  moderate chondromalacia patella.    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 Lorre Nick.  She had full extension and some warmth on palpation.  She is doing well.  Primary osteoarthritis of both feet-she is off-and-on discomfort in her feet.  She states discomfort is mostly from neuropathy.  Neuropathy of both feet  Chronic right SI joint pain-she has intermittent discomfort in her SI joints.  She had no tenderness on examination today.  She states she had good response to cortisone injection in the past.  Stretching exercises were discussed.  DDD (degenerative disc disease), lumbar - x-rays of lumbar spine from February 2022 and MRI from June 2022.  X-rays were consistent with degenerative disc disease and facet joint arthropathy.  Core strengthening exercises were  emphasized.  Osteopenia of multiple sites - Treated with Reclast in the past.  April 21, 2021DXA T score -1.3.  She gets DEXA scan through her PCP.  Calcium rich diet was discussed.  Other medical problems are listed as follows:  Controlled type 2 diabetes mellitus with diabetic polyneuropathy, without long-term current use of insulin (HCC)  History of hyperlipidemia  History of gastroesophageal reflux (GERD)  Anxiety and depression  Other insomnia  History of skin cancer  Hx of colonic polyps  Orders: No orders of the defined types were placed in this encounter.  No orders of the defined types were placed in this encounter.    Follow-Up Instructions: Return in about 6 months (around 08/03/2021) for Osteoarthritis.   Bo Merino, MD  Note - This record has been created using Editor, commissioning.  Chart creation errors have been sought, but may not always  have been located. Such creation errors do not reflect on  the standard of medical care.

## 2021-01-24 ENCOUNTER — Other Ambulatory Visit: Payer: Self-pay | Admitting: Family Medicine

## 2021-01-24 DIAGNOSIS — E785 Hyperlipidemia, unspecified: Secondary | ICD-10-CM

## 2021-01-28 ENCOUNTER — Encounter: Payer: Self-pay | Admitting: Family Medicine

## 2021-02-02 MED ORDER — FLUTICASONE PROPIONATE 50 MCG/ACT NA SUSP: 2.0000 | Freq: Every day | NASAL | 3 refills | Status: DC

## 2021-02-03 ENCOUNTER — Encounter: Payer: Self-pay | Admitting: Rheumatology

## 2021-02-03 ENCOUNTER — Ambulatory Visit (INDEPENDENT_AMBULATORY_CARE_PROVIDER_SITE_OTHER): Payer: Medicare Other | Admitting: Rheumatology

## 2021-02-03 ENCOUNTER — Other Ambulatory Visit: Payer: Self-pay

## 2021-02-03 VITALS — BP 120/84 | HR 74 | Ht 61.5 in | Wt 161.6 lb

## 2021-02-03 DIAGNOSIS — M19042 Primary osteoarthritis, left hand: Secondary | ICD-10-CM

## 2021-02-03 DIAGNOSIS — M1711 Unilateral primary osteoarthritis, right knee: Secondary | ICD-10-CM

## 2021-02-03 DIAGNOSIS — M5136 Other intervertebral disc degeneration, lumbar region: Secondary | ICD-10-CM

## 2021-02-03 DIAGNOSIS — G4709 Other insomnia: Secondary | ICD-10-CM

## 2021-02-03 DIAGNOSIS — M533 Sacrococcygeal disorders, not elsewhere classified: Secondary | ICD-10-CM

## 2021-02-03 DIAGNOSIS — Z96652 Presence of left artificial knee joint: Secondary | ICD-10-CM

## 2021-02-03 DIAGNOSIS — Z85828 Personal history of other malignant neoplasm of skin: Secondary | ICD-10-CM

## 2021-02-03 DIAGNOSIS — Z8719 Personal history of other diseases of the digestive system: Secondary | ICD-10-CM

## 2021-02-03 DIAGNOSIS — Z860100 Personal history of colon polyps, unspecified: Secondary | ICD-10-CM

## 2021-02-03 DIAGNOSIS — M8589 Other specified disorders of bone density and structure, multiple sites: Secondary | ICD-10-CM

## 2021-02-03 DIAGNOSIS — M19072 Primary osteoarthritis, left ankle and foot: Secondary | ICD-10-CM

## 2021-02-03 DIAGNOSIS — M19071 Primary osteoarthritis, right ankle and foot: Secondary | ICD-10-CM

## 2021-02-03 DIAGNOSIS — E1142 Type 2 diabetes mellitus with diabetic polyneuropathy: Secondary | ICD-10-CM

## 2021-02-03 DIAGNOSIS — M19041 Primary osteoarthritis, right hand: Secondary | ICD-10-CM | POA: Diagnosis not present

## 2021-02-03 DIAGNOSIS — M7061 Trochanteric bursitis, right hip: Secondary | ICD-10-CM | POA: Diagnosis not present

## 2021-02-03 DIAGNOSIS — F419 Anxiety disorder, unspecified: Secondary | ICD-10-CM

## 2021-02-03 DIAGNOSIS — G5793 Unspecified mononeuropathy of bilateral lower limbs: Secondary | ICD-10-CM

## 2021-02-03 DIAGNOSIS — G8929 Other chronic pain: Secondary | ICD-10-CM

## 2021-02-03 DIAGNOSIS — M7062 Trochanteric bursitis, left hip: Secondary | ICD-10-CM

## 2021-02-03 DIAGNOSIS — F32A Depression, unspecified: Secondary | ICD-10-CM

## 2021-02-03 DIAGNOSIS — M51369 Other intervertebral disc degeneration, lumbar region without mention of lumbar back pain or lower extremity pain: Secondary | ICD-10-CM

## 2021-02-03 DIAGNOSIS — Z8639 Personal history of other endocrine, nutritional and metabolic disease: Secondary | ICD-10-CM

## 2021-03-02 LAB — HM DIABETES EYE EXAM

## 2021-03-03 ENCOUNTER — Encounter: Payer: Self-pay | Admitting: Family Medicine

## 2021-03-16 ENCOUNTER — Ambulatory Visit (INDEPENDENT_AMBULATORY_CARE_PROVIDER_SITE_OTHER): Payer: Medicare Other | Admitting: Podiatry

## 2021-03-16 ENCOUNTER — Other Ambulatory Visit: Payer: Self-pay

## 2021-03-16 DIAGNOSIS — E1142 Type 2 diabetes mellitus with diabetic polyneuropathy: Secondary | ICD-10-CM

## 2021-03-16 DIAGNOSIS — M79675 Pain in left toe(s): Secondary | ICD-10-CM | POA: Diagnosis not present

## 2021-03-16 DIAGNOSIS — M79674 Pain in right toe(s): Secondary | ICD-10-CM | POA: Diagnosis not present

## 2021-03-16 DIAGNOSIS — B351 Tinea unguium: Secondary | ICD-10-CM | POA: Diagnosis not present

## 2021-03-16 NOTE — Progress Notes (Signed)
°  Subjective:  Patient ID: Renee Bauer, female    DOB: February 01, 1950,  MRN: [id number]  Chief Complaint  Patient presents with   Nail Problem    Nail trim    71 y.o. female returns for the above complaint.  Patient presents with thickened elongated dystrophic toenails x10.  Pain full to touch.  She is a diabetic with last A1c of 5.7.  She would like to have her nails debrided down.  She denies any other acute complaints.  Objective:  There were no vitals filed for this visit. Podiatric Exam: Vascular: dorsalis pedis and posterior tibial pulses are palpable bilateral. Capillary return is immediate. Temperature gradient is WNL. Skin turgor WNL  Sensorium: Normal Semmes Weinstein monofilament test. Normal tactile sensation bilaterally. Nail Exam: Pt has thick disfigured discolored nails with subungual debris noted bilateral entire nail hallux through fifth toenails.  Except left second digit due to previous total nail avulsion.  Pain on palpation to the nails. Ulcer Exam: There is no evidence of ulcer or pre-ulcerative changes or infection. Orthopedic Exam: Muscle tone and strength are WNL. No limitations in general ROM. No crepitus or effusions noted. HAV  B/L.  Hammer toes 2-5  B/L. Skin: No Porokeratosis. No infection or ulcers    Assessment & Plan:   1. Pain due to onychomycosis of toenails of both feet   2. Controlled type 2 diabetes mellitus with diabetic polyneuropathy, without long-term current use of insulin ([place])      Patient was evaluated and treated and all questions answered.  Onychomycosis with pain  -Nails palliatively debrided as below. -Educated on self-care  Procedure: Nail Debridement Rationale: pain  Type of Debridement: manual, sharp debridement. Instrumentation: Nail nipper, rotary burr. Number of Nails: 9  Procedures and Treatment: Consent by patient was obtained for treatment procedures. The patient understood the discussion of treatment and procedures  well. All questions were answered thoroughly reviewed. Debridement of mycotic and hypertrophic toenails, 1 through 5 bilateral and clearing of subungual debris. No ulceration, no infection noted.  Return Visit-Office Procedure: Patient instructed to return to the office for a follow up visit 3 months for continued evaluation and treatment.  Boneta Lucks, DPM    No follow-ups on file.

## 2021-03-20 ENCOUNTER — Encounter: Payer: Self-pay | Admitting: Family Medicine

## 2021-03-20 DIAGNOSIS — M2550 Pain in unspecified joint: Secondary | ICD-10-CM

## 2021-03-21 MED ORDER — MELOXICAM 7.5 MG PO TABS: 7.5000 mg | ORAL_TABLET | Freq: Two times a day (BID) | ORAL | 0 refills | Status: DC | PRN

## 2021-03-29 ENCOUNTER — Encounter: Payer: Self-pay | Admitting: Family Medicine

## 2021-03-30 MED ORDER — FLUVOXAMINE MALEATE 50 MG PO TABS: 50.0000 mg | ORAL_TABLET | Freq: Every day | ORAL | 3 refills | Status: DC

## 2021-04-01 ENCOUNTER — Encounter: Payer: Self-pay | Admitting: Family Medicine

## 2021-04-20 ENCOUNTER — Other Ambulatory Visit: Payer: Self-pay | Admitting: Adult Health

## 2021-04-20 ENCOUNTER — Encounter: Payer: Self-pay | Admitting: Family Medicine

## 2021-04-20 MED ORDER — ALPRAZOLAM 1 MG PO TABS: 1.0000 mg | ORAL_TABLET | ORAL | 0 refills | Status: DC | PRN

## 2021-05-03 ENCOUNTER — Other Ambulatory Visit: Payer: Self-pay | Admitting: Family Medicine

## 2021-05-17 ENCOUNTER — Other Ambulatory Visit: Payer: Self-pay | Admitting: Adult Health

## 2021-05-18 ENCOUNTER — Encounter: Payer: Self-pay | Admitting: Family Medicine

## 2021-05-18 ENCOUNTER — Ambulatory Visit (INDEPENDENT_AMBULATORY_CARE_PROVIDER_SITE_OTHER): Payer: Medicare Other | Admitting: Family Medicine

## 2021-05-18 VITALS — BP 128/70 | HR 70 | Temp 97.8°F | Ht 62.25 in | Wt 159.4 lb

## 2021-05-18 DIAGNOSIS — T148XXA Other injury of unspecified body region, initial encounter: Secondary | ICD-10-CM | POA: Diagnosis not present

## 2021-05-18 DIAGNOSIS — E538 Deficiency of other specified B group vitamins: Secondary | ICD-10-CM

## 2021-05-18 DIAGNOSIS — M8589 Other specified disorders of bone density and structure, multiple sites: Secondary | ICD-10-CM

## 2021-05-18 DIAGNOSIS — Z0000 Encounter for general adult medical examination without abnormal findings: Secondary | ICD-10-CM

## 2021-05-18 DIAGNOSIS — R739 Hyperglycemia, unspecified: Secondary | ICD-10-CM

## 2021-05-18 DIAGNOSIS — R159 Full incontinence of feces: Secondary | ICD-10-CM

## 2021-05-18 DIAGNOSIS — M7062 Trochanteric bursitis, left hip: Secondary | ICD-10-CM

## 2021-05-18 DIAGNOSIS — E663 Overweight: Secondary | ICD-10-CM

## 2021-05-18 DIAGNOSIS — E559 Vitamin D deficiency, unspecified: Secondary | ICD-10-CM

## 2021-05-18 DIAGNOSIS — E1142 Type 2 diabetes mellitus with diabetic polyneuropathy: Secondary | ICD-10-CM | POA: Diagnosis not present

## 2021-05-18 DIAGNOSIS — M7061 Trochanteric bursitis, right hip: Secondary | ICD-10-CM

## 2021-05-18 DIAGNOSIS — E785 Hyperlipidemia, unspecified: Secondary | ICD-10-CM

## 2021-05-18 NOTE — Progress Notes (Signed)
Renee Bauer ?DOB: 12/24/1950 ?Encounter date: 05/18/2021 ? ?This is a 71 y.o. female who presents for complete physical  ? ?History of present illness/Additional concerns: ? ?Last visit with me was 11/17/2020. ?Last mammogram 12/07/2020: Normal ?Due for repeat bone density ?Due for colonoscopy repeat 05/2021 - states that she either has hemorrhoids or rectum is prolapsed again. She started having leakage about a month ago - then started with some bleeding.had surgery for prolapse in the past. Just a little blood and not happening now. More with wiping. Hx of colectomy during surgery for prolapse.  ? ?Getting recurrent spot on left hand - gets red, then fades, then comes back. Just when she thinks it is going away it comes back. Does have bony prominence which is tender under that area. This is isolated area of bruising. Does have hand doc whom she sees for neuropathy with hands, falling asleep at night. Getting injection every 4 mo; wears brace at night.  ? ?Wanting referral for bursitis in hips. Years ago was told that injections were possibility. She rotates from side to side every night due to pain. Left is worse than right side. Feels like it is radiating down the leg. ? ? ?Diabetes: Diet controlled, last A1c was 5.8 in November. Diet controlled and exercising regularly. ?Osteopenia: Status post Boniva for over 5 years with bisphosphonate holiday 2018 ?Anxiety/insomnia:mood has been doing ok. First week on increase on fluvoxamine she was tired, but now feels like this is better. Still using ambien for sleep/alternates between that and alprazolam. Doesn't use all the time. Does use on vacation; can't sleep outside of her house. Might be a little better with mood improvement; not huge.  ? ?Hyperlipidemia: We had her try a holiday from [place], but she had no improvement in terms of aches or pains.  She was asked to restart this after LDL had significant elevation off of medication on lab work back in  November. ? ?Rash under breast. Been there awhile. Using powder but not helping. ? ?Wants ears checked - just to make sure no wax buildup.  ? ? ? ?Past Medical History:  ?Diagnosis Date  ? Anxiety   ? Arthritis   ? Benign colon polyp 2018  ? Bursitis   ? Depression   ? Diabetes mellitus without complication ([place])   ? GERD (gastroesophageal reflux disease)   ? Hyperlipidemia   ? Rosacea   ? ?Past Surgical History:  ?Procedure Laterality Date  ? ABDOMINAL HYSTERECTOMY    ? menorrhagia  ? BLADDER REPAIR    ? BREAST BIOPSY Left   ? BUNIONECTOMY Right   ? COLON RESECTION  2007  ? DILATION AND CURETTAGE OF UTERUS    ? TOTAL KNEE ARTHROPLASTY Left 09/29/2020  ? ?Allergies  ?Allergen Reactions  ? Tramadol   ?  Felt sick with this  ? Wellbutrin [Bupropion]   ?  Muscle twitches  ? Clindamycin Hcl Rash  ? ?No outpatient medications have been marked as taking for the 05/18/21 encounter (Appointment) with Caren Macadam, MD.  ? ?Social History  ? ?Tobacco Use  ? Smoking status: Never  ? Smokeless tobacco: Never  ?Substance Use Topics  ? Alcohol use: Yes  ?  Comment: occasional wine  ? ?Family History  ?Problem Relation Age of Onset  ? Diabetes Mother   ? Arrhythmia Mother   ? High blood pressure Mother   ? Mesothelioma Father   ?     asbestos exposure  ? Allergic rhinitis Father   ?  Sinusitis Father   ? Healthy Sister   ? Brain cancer Maternal Aunt   ? Post-traumatic stress disorder Son   ? Scoliosis Son   ? Asthma Neg Hx   ? Eczema Neg Hx   ? Urticaria Neg Hx   ? Angioedema Neg Hx   ? Immunodeficiency Neg Hx   ? Atopy Neg Hx   ? ? ? ?Review of Systems  ?Constitutional:  Negative for activity change, appetite change, chills, fatigue, fever and unexpected weight change.  ?HENT:  Negative for congestion, ear pain, hearing loss, sinus pressure, sinus pain, sore throat and trouble swallowing.   ?Eyes:  Negative for pain and visual disturbance.  ?Respiratory:  Negative for cough, chest tightness, shortness of breath and wheezing.    ?Cardiovascular:  Negative for chest pain, palpitations and leg swelling.  ?Gastrointestinal:  Positive for blood in stool. Negative for abdominal pain, constipation, diarrhea, nausea and vomiting.  ?     Stool leakage  ?Genitourinary:  Negative for difficulty urinating and menstrual problem.  ?Musculoskeletal:  Negative for arthralgias and back pain.  ?Skin:  Negative for rash.  ?     Recurrent left hand bruise ?  ?Neurological:  Negative for dizziness, weakness, numbness and headaches.  ?Hematological:  Negative for adenopathy. Does not bruise/bleed easily.  ?Psychiatric/Behavioral:  Negative for sleep disturbance and suicidal ideas. The patient is not nervous/anxious.   ? ?CBC:  ?Lab Results  ?Component Value Date  ? WBC 7.4 12/01/2020  ? HGB 13.7 12/01/2020  ? HCT 41.5 12/01/2020  ? MCH 29.5 09/19/2019  ? MCHC 32.9 12/01/2020  ? RDW 13.4 12/01/2020  ? PLT 265.0 12/01/2020  ? MPV 10.4 09/19/2019  ? ?CMP: ?Lab Results  ?Component Value Date  ? NA 140 12/01/2020  ? NA 141 05/14/2018  ? K 4.0 12/01/2020  ? CL 104 12/01/2020  ? CO2 29 12/01/2020  ? GLUCOSE 99 12/01/2020  ? BUN 12 12/01/2020  ? BUN 17 05/14/2018  ? CREATININE 0.78 12/01/2020  ? CREATININE 0.91 09/19/2019  ? CALCIUM 9.8 12/01/2020  ? PROT 6.8 12/01/2020  ? BILITOT 0.4 12/01/2020  ? ALKPHOS 72 12/01/2020  ? ALT 9 12/01/2020  ? AST 14 12/01/2020  ? ?LIPID: ?Lab Results  ?Component Value Date  ? CHOL 220 (H) 12/01/2020  ? TRIG 165.0 (H) 12/01/2020  ? HDL 49.80 12/01/2020  ? LDLCALC 138 (H) 12/01/2020  ? [place] 57 09/19/2019  ? ? ?Objective: ? ?There were no vitals taken for this visit.     ? ?BP Readings from Last 3 Encounters:  ?02/03/21 120/84  ?11/17/20 112/80  ?08/05/20 112/80  ? ?Wt Readings from Last 3 Encounters:  ?02/03/21 161 lb 9.6 oz (73.3 kg)  ?11/17/20 157 lb (71.2 kg)  ?08/05/20 160 lb 3.2 oz (72.7 kg)  ? ? ?Physical Exam ?Constitutional:   ?   General: She is not in acute distress. ?   Appearance: She is well-developed.  ?HENT:  ?    Head: Normocephalic and atraumatic.  ?   Right Ear: External ear normal.  ?   Left Ear: External ear normal.  ?   Mouth/Throat:  ?   Pharynx: No oropharyngeal exudate.  ?Eyes:  ?   Conjunctiva/sclera: Conjunctivae normal.  ?   Pupils: Pupils are equal, round, and reactive to light.  ?Neck:  ?   Thyroid: No thyromegaly.  ?Cardiovascular:  ?   Rate and Rhythm: Normal rate and regular rhythm.  ?   Heart sounds: Normal heart sounds. No murmur  heard. ?  No friction rub. No gallop.  ?Pulmonary:  ?   Effort: Pulmonary effort is normal.  ?   Breath sounds: Normal breath sounds.  ?Abdominal:  ?   General: Abdomen is flat. Bowel sounds are normal. There is no distension.  ?   Palpations: Abdomen is soft. There is no mass.  ?   Tenderness: There is no abdominal tenderness. There is no guarding.  ?   Hernia: No hernia is present.  ?Musculoskeletal:     ?   General: No tenderness or deformity. Normal range of motion.  ?   Cervical back: Normal range of motion and neck supple.  ?Lymphadenopathy:  ?   Cervical: No cervical adenopathy.  ?Skin: ?   General: Skin is warm and dry.  ?   Findings: No rash.  ?   Comments: Left hand bony prominence underlying center of 1.5cm resolving echymosis. Slightly tender to touch.  ?Neurological:  ?   Mental Status: She is alert and oriented to person, place, and time.  ?   Deep Tendon Reflexes: Reflexes normal.  ?   Reflex Scores: ?     Tricep reflexes are 2+ on the right side and 2+ on the left side. ?     Bicep reflexes are 2+ on the right side and 2+ on the left side. ?     Brachioradialis reflexes are 2+ on the right side and 2+ on the left side. ?     Patellar reflexes are 2+ on the right side and 2+ on the left side. ?Psychiatric:     ?   Speech: Speech normal.     ?   Behavior: Behavior normal.     ?   Thought Content: Thought content normal.  ? ? ?Assessment/Plan: ?Health Maintenance Due  ?Topic Date Due  ? COVID-19 Vaccine (3 - Moderna risk series) 08/03/2019  ? Pneumonia Vaccine 84+  Years old (2 - PPSV23 if available, else PCV20) 11/04/2019  ? Zoster Vaccines- Shingrix (2 of 2) 08/05/2020  ? FOOT EXAM  03/24/2021  ? URINE MICROALBUMIN  03/24/2021  ? ?Health Maintenance reviewed. ? ?1. Preventative he

## 2021-05-18 NOTE — Patient Instructions (Addendum)
Dr. Leafy Ro at [facility] ? ?*try stopping the meloxicam and see how arthritis pain is. If pain returns, then go back to just once daily 7.'5mg'$  meloxicam and let me know if this is adequate for pain. If not you can do 2 tablets once daily (and we can send in new rx for '15mg'$  IF that is helpful). If none of the above are helpful, let me know. ?

## 2021-05-19 LAB — COMPREHENSIVE METABOLIC PANEL
ALT: 12 U/L (ref 0–35)
AST: 18 U/L (ref 0–37)
Albumin: 4.4 g/dL (ref 3.5–5.2)
Alkaline Phosphatase: 78 U/L (ref 39–117)
BUN: 15 mg/dL (ref 6–23)
CO2: 28 mEq/L (ref 19–32)
Calcium: 9.4 mg/dL (ref 8.4–10.5)
Chloride: 103 mEq/L (ref 96–112)
Creatinine, Ser: 0.86 mg/dL (ref 0.40–1.20)
GFR: 69.28 mL/min (ref >60.00)
Glucose, Bld: 89 mg/dL (ref 70–99)
Potassium: 3.6 mEq/L (ref 3.5–5.1)
Sodium: 140 mEq/L (ref 135–145)
Total Bilirubin: 0.5 mg/dL (ref 0.2–1.2)
Total Protein: 7.2 g/dL (ref 6.0–8.3)

## 2021-05-19 LAB — CBC WITH DIFFERENTIAL/PLATELET
Basophils Absolute: 0.1 10*3/uL (ref 0.0–0.1)
Basophils Relative: 1.2 % (ref 0.0–3.0)
Eosinophils Absolute: 0.1 10*3/uL (ref 0.0–0.7)
Eosinophils Relative: 1.9 % (ref 0.0–5.0)
HCT: 43.1 % (ref 36.0–46.0)
Hemoglobin: 14.2 g/dL (ref 12.0–15.0)
Lymphocytes Relative: 28.5 % (ref 12.0–46.0)
Lymphs Abs: 2 10*3/uL (ref 0.7–4.0)
MCHC: 32.8 g/dL (ref 30.0–36.0)
MCV: 89.1 fl (ref 78.0–100.0)
Monocytes Absolute: 0.4 10*3/uL (ref 0.1–1.0)
Monocytes Relative: 6.2 % (ref 3.0–12.0)
Neutro Abs: 4.3 10*3/uL (ref 1.4–7.7)
Neutrophils Relative %: 62.2 % (ref 43.0–77.0)
Platelets: 216 10*3/uL (ref 150.0–400.0)
RBC: 4.84 Mil/uL (ref 3.87–5.11)
RDW: 12.9 % (ref 11.5–15.5)
WBC: 7 10*3/uL (ref 4.0–10.5)

## 2021-05-19 LAB — LIPID PANEL
Cholesterol: 142 mg/dL (ref 0–200)
HDL: 67.4 mg/dL (ref >39.00)
LDL Cholesterol: 56 mg/dL (ref 0–99)
NonHDL: 74.38
Total CHOL/HDL Ratio: 2
Triglycerides: 92 mg/dL (ref 0.0–149.0)
VLDL: 18.4 mg/dL (ref 0.0–40.0)

## 2021-05-19 LAB — VITAMIN B12: Vitamin B-12: 377 pg/mL (ref 211–911)

## 2021-05-19 LAB — VITAMIN D 25 HYDROXY (VIT D DEFICIENCY, FRACTURES): VITD: 38.35 ng/mL (ref 30.00–100.00)

## 2021-05-19 LAB — HEMOGLOBIN A1C: Hgb A1c MFr Bld: 5.9 % (ref 4.6–6.5)

## 2021-05-19 NOTE — Telephone Encounter (Signed)
Message routed to PCP CMA  

## 2021-05-20 NOTE — Progress Notes (Signed)
?Charlann Boxer D.O. ?[facility] ?[place] ?Phone: [phone] ?Subjective:   ?I, Renee Bauer, am serving as a scribe for Dr. Hulan Saas. ? ?I'm seeing this patient by the request  of:  Caren Macadam, MD ? ?CC: hip pain  ? ?ZDG:[id number]  ?Renee Bauer is a 71 y.o. female coming in with complaint of hip pain going on for years patient states she was told it was bursitis in the past. Patient describes the pain as bruised and has to keep switching sides at night because of the pain. Patient locates the pain lower buttock/side buttock pan that radiates doe the lateral side of both legs. Left worst than the right. Patient states for about three years she has been having trouble sleeping because of the pain, patient has moved to [place] about 3 years ago. Patient gets at most 4 hours of sleep. Patient does stay active a couple miles for five days a week and a stationary bike. Patient has recently had a full Left knee replacement which she was given exercises for that she continues to do, takes Meloxicam and a topical called "ice Blue" and if really bad will take tylenol to help sleep at night.  ? ? ? ?Reviewing chart patient is followed by rheumatology for significant number of comorbidities as well as multiple different orthopedic providers over the years. ? ?Last imaging shows an MRI of the lumbar spine in June 2022 showing facet arthropathy at L4-S1 but otherwise fairly unremarkable with no signs of spinal stenosis or significant nerve root impingement. ? ?  ? ?Past Medical History:  ?Diagnosis Date  ? Anxiety   ? Arthritis   ? Benign colon polyp 2018  ? Bursitis   ? Depression   ? Diabetes mellitus without complication ([place])   ? GERD (gastroesophageal reflux disease)   ? Hyperlipidemia   ? Rosacea   ? ?Past Surgical History:  ?Procedure Laterality Date  ? ABDOMINAL HYSTERECTOMY    ? menorrhagia  ? BLADDER REPAIR    ? BREAST BIOPSY Left   ? BUNIONECTOMY Right   ?  COLON RESECTION  2007  ? DILATION AND CURETTAGE OF UTERUS    ? TOTAL KNEE ARTHROPLASTY Left 09/29/2020  ? ?Social History  ? ?Socioeconomic History  ? Marital status: Married  ?  Spouse name: Not on file  ? Number of children: Not on file  ? Years of education: Not on file  ? Highest education level: Not on file  ?Occupational History  ? Not on file  ?Tobacco Use  ? Smoking status: Never  ? Smokeless tobacco: Never  ?Vaping Use  ? Vaping Use: Never used  ?Substance and Sexual Activity  ? Alcohol use: Yes  ?  Comment: occasional wine  ? Drug use: Never  ? Sexual activity: Not on file  ?Other Topics Concern  ? Not on file  ?Social History Narrative  ? Not on file  ? ?Social Determinants of Health  ? ?Financial Resource Strain: Low Risk   ? Difficulty of Paying Living Expenses: Not hard at all  ?Food Insecurity: No Food Insecurity  ? Worried About Charity fundraiser in the Last Year: Never true  ? Ran Out of Food in the Last Year: Never true  ?Transportation Needs: No Transportation Needs  ? Lack of Transportation (Medical): No  ? Lack of Transportation (Non-Medical): No  ?Physical Activity: Insufficiently Active  ? Days of Exercise per Week: 4 days  ? Minutes of  Exercise per Session: 30 min  ?Stress: No Stress Concern Present  ? Feeling of Stress : Only a little  ?Social Connections: Socially Integrated  ? Frequency of Communication with Friends and Family: More than three times a week  ? Frequency of Social Gatherings with Friends and Family: More than three times a week  ? Attends Religious Services: 1 to 4 times per year  ? Active Member of Clubs or Organizations: Yes  ? Attends Archivist Meetings: 1 to 4 times per year  ? Marital Status: Married  ? ?Allergies  ?Allergen Reactions  ? Tramadol   ?  Felt sick with this  ? Wellbutrin [Bupropion]   ?  Muscle twitches  ? Clindamycin Hcl Rash  ? ?Family History  ?Problem Relation Age of Onset  ? Diabetes Mother   ? Arrhythmia Mother   ? High blood pressure  Mother   ? Mesothelioma Father   ?     asbestos exposure  ? Allergic rhinitis Father   ? Sinusitis Father   ? Healthy Sister   ? Brain cancer Maternal Aunt   ? Post-traumatic stress disorder Son   ? Scoliosis Son   ? Asthma Neg Hx   ? Eczema Neg Hx   ? Urticaria Neg Hx   ? Angioedema Neg Hx   ? Immunodeficiency Neg Hx   ? Atopy Neg Hx   ? ? ?Current Outpatient Medications (Endocrine & Metabolic):  ?  betamethasone acetate-betamethasone sodium phosphate (CELESTONE) 6 (3-3) MG/ML injection, Inject into the articular space. ? ?Current Outpatient Medications (Cardiovascular):  ?  rosuvastatin (CRESTOR) 10 MG tablet, TAKE 1 TABLET AT BEDTIME ? ?Current Outpatient Medications (Respiratory):  ?  fluticasone (FLONASE) 50 MCG/ACT nasal spray, Place 2 sprays into both nostrils daily. ?  montelukast (SINGULAIR) 10 MG tablet, Take 1 tablet (10 mg total) by mouth at bedtime. (Patient taking differently: Take 10 mg by mouth as needed.) ? ?Current Outpatient Medications (Analgesics):  ?  meloxicam (MOBIC) 7.5 MG tablet, Take 1 tablet (7.5 mg total) by mouth 2 (two) times daily as needed for pain. ? ?Current Outpatient Medications (Hematological):  ?  vitamin B-12 (CYANOCOBALAMIN) 1000 MCG tablet, Take 1,000 mcg by mouth daily. ? ?Current Outpatient Medications (Other):  ?  ALPRAZolam (XANAX) 1 MG tablet, TAKE 1 TABLET AS NEEDED FOR ANXIETY ?  CALCIUM CITRATE PO, Take 600 mg by mouth daily. ?  Cholecalciferol (VITAMIN D3 PO), Take 2,000 Units by mouth daily. ?  esomeprazole (NEXIUM) 40 MG capsule, Take 1 capsule (40 mg total) by mouth daily. ?  fluvoxaMINE (LUVOX) 50 MG tablet, Take 1 tablet (50 mg total) by mouth at bedtime. ?  gabapentin (NEURONTIN) 100 MG capsule, TAKE 1 CAPSULE THREE TIMES A DAY ?  lidocaine-prilocaine (EMLA) cream, APPLY 1 APPLICATION TOPICALLY AS NEEDED ?  METRONIDAZOLE, TOPICAL, 0.75 % LOTN, as needed.  ?  OVER THE COUNTER MEDICATION, Zeasorb powder ?  TERBINAFINE EX, Apply topically. ?  zolpidem (AMBIEN CR)  6.25 MG CR tablet, Take 1 tablet (6.25 mg total) by mouth at bedtime as needed for sleep. ? ? ?Reviewed prior external information including notes and imaging from  ?primary care provider ?As well as notes that were available from care everywhere and other healthcare systems. ? ?Past medical history, social, surgical and family history all reviewed in electronic medical record.  No pertanent information unless stated regarding to the chief complaint.  ? ?Review of Systems: ? No headache, visual changes, nausea, vomiting, diarrhea, constipation, dizziness, abdominal pain,  skin rash, fevers, chills, night sweats, weight loss, swollen lymph nodes, body aches, joint swelling, chest pain, shortness of breath, mood changes. POSITIVE muscle aches ? ?Objective  ?Blood pressure 130/82, pulse 71, height 5' 2.25" (1.581 m), weight 160 lb (72.6 kg), SpO2 97 %. ?  ?General: No apparent distress alert and oriented x3 mood and affect normal, dressed appropriately.  ?HEENT: Pupils equal, extraocular movements intact  ?Respiratory: Patient's speak in full sentences and does not appear short of breath  ?Cardiovascular: No lower extremity edema, non tender, no erythema  ?Gait normal with good balance and coordination.  ?MSK:  hip exam shows ? ?Back exam shows loss of lordosis, patient does have severe tenderness to palpation over the greater trochanteric area in the gluteal area bilaterally.  Tightness noted with FABER test as well.  Negative straight leg test. ? ? ?Procedure: Real-time Ultrasound Guided Injection of right greater trochanteric bursitis secondary to patient's body habitus ?Device: GE Logiq Q7 ?Ultrasound guided injection is preferred based studies that show increased duration, increased effect, greater accuracy, decreased procedural pain, increased response rate, and decreased cost with ultrasound guided versus blind injection.  ?Verbal informed consent obtained.  ?Noted no overlying erythema, induration, or other  signs of local infection.  ?Skin prepped in a sterile fashion.  ?Local anesthesia: Topical Ethyl chloride.  ?With sterile technique and under real time ultrasound guidance:  Greater trochanteric area was visu

## 2021-05-23 ENCOUNTER — Ambulatory Visit (INDEPENDENT_AMBULATORY_CARE_PROVIDER_SITE_OTHER): Payer: Medicare Other | Admitting: Family Medicine

## 2021-05-23 ENCOUNTER — Ambulatory Visit (INDEPENDENT_AMBULATORY_CARE_PROVIDER_SITE_OTHER): Payer: Medicare Other

## 2021-05-23 ENCOUNTER — Ambulatory Visit: Payer: Self-pay

## 2021-05-23 VITALS — BP 130/82 | HR 71 | Ht 62.25 in | Wt 160.0 lb

## 2021-05-23 DIAGNOSIS — M7062 Trochanteric bursitis, left hip: Secondary | ICD-10-CM | POA: Diagnosis not present

## 2021-05-23 DIAGNOSIS — M25552 Pain in left hip: Secondary | ICD-10-CM | POA: Diagnosis not present

## 2021-05-23 DIAGNOSIS — M7061 Trochanteric bursitis, right hip: Secondary | ICD-10-CM | POA: Insufficient documentation

## 2021-05-23 DIAGNOSIS — M25551 Pain in right hip: Secondary | ICD-10-CM

## 2021-05-23 LAB — URIC ACID: Uric Acid, Serum: 1.8 mg/dL — ABNORMAL LOW (ref 2.4–7.0)

## 2021-05-23 NOTE — Assessment & Plan Note (Signed)
Bilateral injections given today, tolerated the procedure well, will rule out any underlying arthritic changes with x-ray today.  In addition to this we discussed home exercise but patient has responded much better to formal physical therapy over the years.  We will get patient back into formal physical therapy.  Encouraged her to continue the gabapentin, discussed over-the-counter natural supplementations that could be helpful.  Follow-up with me again 6 to 8 weeks.  Worsening pain will consider the possibility of further evaluation of lumbar radiculopathy but hopefully not with patient already having a history of multiple surgeries. ?

## 2021-05-23 NOTE — Patient Instructions (Addendum)
Good to see you  ?PT to [facility] ?Get labs and x-ray on the way out ?Turmeric '500mg'$  daily  ?Tart cherry extract '1200mg'$  at night ?Vitamin D 2000 IU daily  ?Follow up in 6-8 weeks ? ? ? ? ? ?  ?

## 2021-05-28 ENCOUNTER — Encounter: Payer: Self-pay | Admitting: Family Medicine

## 2021-06-03 ENCOUNTER — Telehealth: Payer: Self-pay | Admitting: Family Medicine

## 2021-06-03 ENCOUNTER — Encounter: Payer: Self-pay | Admitting: Nurse Practitioner

## 2021-06-03 ENCOUNTER — Encounter: Payer: Self-pay | Admitting: Family Medicine

## 2021-06-03 ENCOUNTER — Ambulatory Visit (INDEPENDENT_AMBULATORY_CARE_PROVIDER_SITE_OTHER): Payer: Medicare Other | Admitting: Nurse Practitioner

## 2021-06-03 VITALS — BP 124/80 | HR 73 | Ht 62.0 in | Wt 162.4 lb

## 2021-06-03 DIAGNOSIS — Z86010 Personal history of colonic polyps: Secondary | ICD-10-CM

## 2021-06-03 DIAGNOSIS — K219 Gastro-esophageal reflux disease without esophagitis: Secondary | ICD-10-CM | POA: Diagnosis not present

## 2021-06-03 DIAGNOSIS — R159 Full incontinence of feces: Secondary | ICD-10-CM

## 2021-06-03 MED ORDER — HYDROCORTISONE (PERIANAL) 2.5 % EX CREA: 1.0000 "application " | TOPICAL_CREAM | Freq: Every evening | CUTANEOUS | 0 refills | Status: DC

## 2021-06-03 MED ORDER — HYDROCORTISONE (PERIANAL) 2.5 % EX CREA: 1.0000 "application " | TOPICAL_CREAM | Freq: Every evening | CUTANEOUS | 0 refills | Status: AC

## 2021-06-03 NOTE — Telephone Encounter (Signed)
Left message for patient to call back and schedule Medicare Annual Wellness Visit (AWV) either virtually or in office. Left  my Herbie Drape number [phone] ? ? ?Last AWV 06/04/20 ? please schedule at anytime with [facility] Nurse Health Advisor 1 or 2 ? ? ?

## 2021-06-03 NOTE — Patient Instructions (Addendum)
We have sent the following medications to your pharmacy for you to pick up at your convenience: ?Anusol cream  ? ?Follow up on: 06/30/21 at 10:30am with Tye Savoy NP  ? ?Thank you for choosing me and [facility]. ? ? ? ?

## 2021-06-03 NOTE — Progress Notes (Signed)
? ? ?Assessment  ? ?Patient profile:  ?Renee Bauer is a 71 y.o. female new to practice, referred by PCP for a history of colon polyps and fecal incontinence.  Her past medical history is significant for diet controlled diabetes, HLD, anxiety, rosacea, anxiety, hysterectomy, colon resection ( no detail at present), GERD. See PMH below for any additional history ? ?Fecal leakage, intermittent for a couple of months  ?No fecal leakage in the last week. This doesn't seem to be related to incomplete emptying during defecation. Good pelvic descent on DRE. Sphincter tone ( resting and squeezing) grossly normal on exam. She has external hemorrhoids. She could possibly have internal hemorrhoids which can sometimes lead to fecal soiling ? ?GERD, chronic.  ?Heartburn controlled on daily Nexium. Gets symptoms if skips a dose. Remote EGD  in 2007,  she thinks ? ?Plan  ?Treat empirically for internal hemorrhoids with Anusol cream per rectum x10 days ?Patient will contact PCP to see if she has a copy of her last colonoscopy report.  Patient cannot recall who did the colonoscopy so we are unable to request records ?We will request records from colorectal surgeon in [place] ?Follow up with me in a few weeks. By then we should hopefully have her her records and can decide when her next colonoscopy is due ?If this relatively new fecal incontinence does not improve then will consider referral to a pelvic floor physical therapist ? ?History of Present Illness  ? ?Chief complaint:  having fecal incontinence.  ? ?Renee Bauer gives a history of colon polyps. Her colonoscopies were done in [place], last one in 2018. Sounds like two polyps removed at time of last colonoscopy.  Unable to locate records in [place] though patient thinks PCP may have copies of the report. She cannot remember who performed the her colonoscopies. She gives a history of a colon resection in 2007 for ? ( maybe prolapsed rectum). No [place] of colon cancer. No blood in stool.  Hgb is 14. BMs normal except for fecal leakage over the last couple of months. She feels like she empties rectum adequately during defecation but then finds soft stool in underwear later in the day. This also happen during the night.  No history of rectal trauma. She also has chronic urine leakage.  ? ? ?Previous Labs / Imaging:: ? ?  Latest Ref Rng & Units 05/18/2021  ?  3:52 PM 12/01/2020  ? 11:19 AM 03/24/2020  ?  9:06 AM  ?CBC  ?WBC 4.0 - 10.5 K/uL 7.0   7.4   6.3    ?Hemoglobin 12.0 - 15.0 g/dL 14.2   13.7   14.5    ?Hematocrit 36.0 - 46.0 % 43.1   41.5   42.8    ?Platelets 150.0 - 400.0 K/uL 216.0   265.0   267.0    ? ? ?No results found for: LIPASE ? ?  Latest Ref Rng & Units 05/18/2021  ?  3:52 PM 12/01/2020  ? 11:19 AM 03/24/2020  ?  9:06 AM  ?CMP  ?Glucose 70 - 99 mg/dL 89   99   123    ?BUN 6 - 23 mg/dL '15   12   12    '$ ?Creatinine 0.40 - 1.20 mg/dL 0.86   0.78   0.84    ?Sodium 135 - 145 mEq/L 140   140   137    ?Potassium 3.5 - 5.1 mEq/L 3.6   4.0   4.1    ?Chloride 96 -  112 mEq/L 103   104   102    ?CO2 19 - 32 mEq/L '28   29   25    '$ ?Calcium 8.4 - 10.5 mg/dL 9.4   9.8   10.1    ?Total Protein 6.0 - 8.3 g/dL 7.2   6.8   7.0    ?Total Bilirubin 0.2 - 1.2 mg/dL 0.5   0.4   0.5    ?Alkaline Phos 39 - 117 U/L 78   72   70    ?AST 0 - 37 U/L '18   14   17    '$ ?ALT 0 - 35 U/L '12   9   13    '$ ? ? ?Previous GI Evaluations  ? ?Endoscopies: ?Done in [place] ? ?Past Medical History:  ?Diagnosis Date  ? Anxiety   ? Arthritis   ? Benign colon polyp 2018  ? Bursitis   ? Depression   ? Diabetes mellitus without complication ([place])   ? GERD (gastroesophageal reflux disease)   ? Hyperlipidemia   ? Rosacea   ? ?Past Surgical History:  ?Procedure Laterality Date  ? ABDOMINAL HYSTERECTOMY    ? menorrhagia  ? BLADDER REPAIR    ? BREAST BIOPSY Left   ? BUNIONECTOMY Right   ? COLON RESECTION  2007  ? DILATION AND CURETTAGE OF UTERUS    ? TOTAL KNEE ARTHROPLASTY Left 09/29/2020  ? ?Family History  ?Problem Relation Age of Onset  ? Diabetes  Mother   ? Arrhythmia Mother   ? High blood pressure Mother   ? Mesothelioma Father   ?     asbestos exposure  ? Allergic rhinitis Father   ? Sinusitis Father   ? Healthy Sister   ? Post-traumatic stress disorder Son   ? Scoliosis Son   ? Brain cancer Maternal Aunt   ? Asthma Neg Hx   ? Eczema Neg Hx   ? Urticaria Neg Hx   ? Angioedema Neg Hx   ? Immunodeficiency Neg Hx   ? Atopy Neg Hx   ? Colon cancer Neg Hx   ? Esophageal cancer Neg Hx   ? Stomach cancer Neg Hx   ? Colon polyps Neg Hx   ? ?Social History  ? ?Tobacco Use  ? Smoking status: Never  ? Smokeless tobacco: Never  ?Vaping Use  ? Vaping Use: Never used  ?Substance Use Topics  ? Alcohol use: Yes  ?  Comment: occasional wine  ? Drug use: Never  ? ?Current Outpatient Medications  ?Medication Sig Dispense Refill  ? ALPRAZolam (XANAX) 1 MG tablet TAKE 1 TABLET AS NEEDED FOR ANXIETY 30 tablet 2  ? betamethasone acetate-betamethasone sodium phosphate (CELESTONE) 6 (3-3) MG/ML injection Inject into the articular space.    ? CALCIUM CITRATE PO Take 600 mg by mouth daily.    ? Cholecalciferol (VITAMIN D3 PO) Take 2,000 Units by mouth daily.    ? esomeprazole (NEXIUM) 40 MG capsule Take 1 capsule (40 mg total) by mouth daily. 90 capsule 1  ? fluticasone (FLONASE) 50 MCG/ACT nasal spray Place 2 sprays into both nostrils daily. 48 g 3  ? fluvoxaMINE (LUVOX) 50 MG tablet Take 1 tablet (50 mg total) by mouth at bedtime. 90 tablet 3  ? gabapentin (NEURONTIN) 100 MG capsule TAKE 1 CAPSULE THREE TIMES A DAY 90 capsule 11  ? lidocaine-prilocaine (EMLA) cream APPLY 1 APPLICATION TOPICALLY AS NEEDED 30 g 11  ? meloxicam (MOBIC) 7.5 MG tablet Take 1 tablet (7.5 mg total)  by mouth 2 (two) times daily as needed for pain. 180 tablet 0  ? METRONIDAZOLE, TOPICAL, 0.75 % LOTN as needed.     ? montelukast (SINGULAIR) 10 MG tablet Take 1 tablet (10 mg total) by mouth at bedtime. (Patient taking differently: Take 10 mg by mouth as needed.) 90 tablet 3  ? OVER THE COUNTER MEDICATION  Zeasorb powder    ? rosuvastatin (CRESTOR) 10 MG tablet TAKE 1 TABLET AT BEDTIME 90 tablet 1  ? TERBINAFINE EX Apply topically.    ? vitamin B-12 (CYANOCOBALAMIN) 1000 MCG tablet Take 1,000 mcg by mouth daily.    ? zolpidem (AMBIEN CR) 6.25 MG CR tablet Take 1 tablet (6.25 mg total) by mouth at bedtime as needed for sleep. 90 tablet 3  ? ?No current facility-administered medications for this visit.  ? ?Allergies  ?Allergen Reactions  ? Tramadol   ?  Felt sick with this  ? Wellbutrin [Bupropion]   ?  Muscle twitches  ? Clindamycin Hcl Rash  ? ? ?Review of Systems: ?Positive for arthritis, sleeping problems.  All other systems reviewed and negative except where noted in HPI.  ? ?Physical Exam  ? ?Wt Readings from Last 3 Encounters:  ?06/03/21 162 lb 6.4 oz (73.7 kg)  ?05/23/21 160 lb (72.6 kg)  ?05/18/21 159 lb 6.4 oz (72.3 kg)  ? ? ?BP 124/80   Pulse 73   Ht '5\' 2"'$  (1.575 m)   Wt 162 lb 6.4 oz (73.7 kg)   SpO2 97%   BMI 29.70 kg/m?  ?Constitutional:  Generally well appearing female in no acute distress. ?Psychiatric: Pleasant. Normal mood and affect. Behavior is normal. ?EENT: Pupils normal.  Conjunctivae are normal. No scleral icterus. ?Neck supple.  ?Cardiovascular: Normal rate, regular rhythm. No edema ?Pulmonary/chest: Effort normal and breath sounds normal. No wheezing, rales or rhonchi. ?Abdominal: Soft, nondistended, nontender. Bowel sounds active throughout. There are no masses palpable. No hepatomegaly. ?Rectal Small external hemorrhoids. No masses felt on DRE. Good pelvic descent. Sphincter with adequate resting and squeeze pressure  ?Neurological: Alert and oriented to person place and time. ?Skin: Skin is warm and dry. No rashes noted. ? ?Tye Savoy, NP  06/03/2021, 10:43 AM ? ?Cc:  ?Referring Provider ?Caren Macadam, MD ? ? ? ? ? ? ? ?

## 2021-06-06 ENCOUNTER — Ambulatory Visit (INDEPENDENT_AMBULATORY_CARE_PROVIDER_SITE_OTHER): Payer: Medicare Other

## 2021-06-06 VITALS — Ht 62.0 in | Wt 162.0 lb

## 2021-06-06 DIAGNOSIS — Z0000 Encounter for general adult medical examination without abnormal findings: Secondary | ICD-10-CM

## 2021-06-06 NOTE — Progress Notes (Signed)
? ?Subjective:  ? Renee Bauer is a 71 y.o. female who presents for Medicare Annual (Subsequent) preventive examination. ? ?Review of Systems    ?Virtual Visit via Telephone Note ? ?I connected with  Renee Bauer on 06/06/21 at 11:15 AM EDT by telephone and verified that I am speaking with the correct person using two identifiers. ? ?Location: ?Patient: Home  ?Provider: Office ?Persons participating in the virtual visit: patient/Nurse Health Advisor ?  ?I discussed the limitations, risks, security and privacy concerns of performing an evaluation and management service by telephone and the availability of in person appointments. The patient expressed understanding and agreed to proceed. ? ?Interactive audio and video telecommunications were attempted between this nurse and patient, however failed, due to patient having technical difficulties OR patient did not have access to video capability.  We continued and completed visit with audio only. ? ?Some vital signs may be absent or patient reported.  ? ?Renee Peaches, LPN  ?Cardiac Risk Factors include: advanced age (>53mn, >>73women) ? ?   ?Objective:  ?  ?Today's Vitals  ? 06/06/21 1122  ?Weight: 162 lb (73.5 kg)  ?Height: '5\' 2"'$  (1.575 m)  ? ?Body mass index is 29.63 kg/m?. ? ? ?  06/06/2021  ? 11:30 AM 06/04/2020  ?  1:28 PM  ?Advanced Directives  ?Does Patient Have a Medical Advance Directive? Yes Yes  ?Type of [place] will  ?Does patient want to make changes to medical advance directive? No - Patient declined No - Patient declined  ?Copy of [place] Chart? No - copy requested No - copy requested  ? ? ?Current Medications (verified) ?Outpatient Encounter Medications as of 06/06/2021  ?Medication Sig  ? ALPRAZolam (XANAX) 1 MG tablet TAKE 1 TABLET AS NEEDED FOR ANXIETY  ? betamethasone acetate-betamethasone sodium phosphate (CELESTONE) 6 (3-3) MG/ML  injection Inject into the articular space.  ? CALCIUM CITRATE PO Take 600 mg by mouth daily.  ? Cholecalciferol (VITAMIN D3 PO) Take 2,000 Units by mouth daily.  ? esomeprazole (NEXIUM) 40 MG capsule Take 1 capsule (40 mg total) by mouth daily.  ? fluticasone (FLONASE) 50 MCG/ACT nasal spray Place 2 sprays into both nostrils daily.  ? fluvoxaMINE (LUVOX) 50 MG tablet Take 1 tablet (50 mg total) by mouth at bedtime.  ? gabapentin (NEURONTIN) 100 MG capsule TAKE 1 CAPSULE THREE TIMES A DAY  ? hydrocortisone (ANUSOL-HC) 2.5 % rectal cream Place 1 application. rectally at bedtime for 10 days.  ? lidocaine-prilocaine (EMLA) cream APPLY 1 APPLICATION TOPICALLY AS NEEDED  ? meloxicam (MOBIC) 7.5 MG tablet Take 1 tablet (7.5 mg total) by mouth 2 (two) times daily as needed for pain.  ? METRONIDAZOLE, TOPICAL, 0.75 % LOTN as needed.   ? montelukast (SINGULAIR) 10 MG tablet Take 1 tablet (10 mg total) by mouth at bedtime. (Patient taking differently: Take 10 mg by mouth as needed.)  ? OVER THE COUNTER MEDICATION Zeasorb powder  ? rosuvastatin (CRESTOR) 10 MG tablet TAKE 1 TABLET AT BEDTIME  ? TERBINAFINE EX Apply topically.  ? vitamin B-12 (CYANOCOBALAMIN) 1000 MCG tablet Take 1,000 mcg by mouth daily.  ? zolpidem (AMBIEN CR) 6.25 MG CR tablet Take 1 tablet (6.25 mg total) by mouth at bedtime as needed for sleep.  ? ?No facility-administered encounter medications on file as of 06/06/2021.  ? ? ?Allergies (verified) ?Tramadol, Wellbutrin [bupropion], and Clindamycin hcl  ? ?History: ?Past Medical History:  ?  Diagnosis Date  ? Anxiety   ? Arthritis   ? Benign colon polyp 2018  ? Bursitis   ? Depression   ? Diabetes mellitus without complication ([place])   ? GERD (gastroesophageal reflux disease)   ? Hyperlipidemia   ? Rosacea   ? ?Past Surgical History:  ?Procedure Laterality Date  ? ABDOMINAL HYSTERECTOMY    ? menorrhagia  ? BLADDER REPAIR    ? BREAST BIOPSY Left   ? BUNIONECTOMY Right   ? COLON RESECTION  2007  ? DILATION AND  CURETTAGE OF UTERUS    ? TOTAL KNEE ARTHROPLASTY Left 09/29/2020  ? ?Family History  ?Problem Relation Age of Onset  ? Diabetes Mother   ? Arrhythmia Mother   ? High blood pressure Mother   ? Mesothelioma Father   ?     asbestos exposure  ? Allergic rhinitis Father   ? Sinusitis Father   ? Healthy Sister   ? Post-traumatic stress disorder Son   ? Scoliosis Son   ? Brain cancer Maternal Aunt   ? Asthma Neg Hx   ? Eczema Neg Hx   ? Urticaria Neg Hx   ? Angioedema Neg Hx   ? Immunodeficiency Neg Hx   ? Atopy Neg Hx   ? Colon cancer Neg Hx   ? Esophageal cancer Neg Hx   ? Stomach cancer Neg Hx   ? Colon polyps Neg Hx   ? ?Social History  ? ?Socioeconomic History  ? Marital status: Married  ?  Spouse name: Not on file  ? Number of children: 2  ? Years of education: Not on file  ? Highest education level: Not on file  ?Occupational History  ? Occupation: Retired  ?Tobacco Use  ? Smoking status: Never  ? Smokeless tobacco: Never  ?Vaping Use  ? Vaping Use: Never used  ?Substance and Sexual Activity  ? Alcohol use: Yes  ?  Comment: occasional wine  ? Drug use: Never  ? Sexual activity: Not on file  ?Other Topics Concern  ? Not on file  ?Social History Narrative  ? Not on file  ? ?Social Determinants of Health  ? ?Financial Resource Strain: Low Risk   ? Difficulty of Paying Living Expenses: Not hard at all  ?Food Insecurity: No Food Insecurity  ? Worried About Charity fundraiser in the Last Year: Never true  ? Ran Out of Food in the Last Year: Never true  ?Transportation Needs: No Transportation Needs  ? Lack of Transportation (Medical): No  ? Lack of Transportation (Non-Medical): No  ?Physical Activity: Sufficiently Active  ? Days of Exercise per Week: 5 days  ? Minutes of Exercise per Session: 50 min  ?Stress: No Stress Concern Present  ? Feeling of Stress : Only a little  ?Social Connections: Socially Integrated  ? Frequency of Communication with Friends and Family: Twice a week  ? Frequency of Social Gatherings with  Friends and Family: Once a week  ? Attends Religious Services: More than 4 times per year  ? Active Member of Clubs or Organizations: Yes  ? Attends Archivist Meetings: More than 4 times per year  ? Marital Status: Married  ? ? ?Tobacco Counseling ?Counseling given: Not Answered ? ? ?Clinical Intake: ? ?Pre-visit preparation completed: Yes ? ?Pain : No/denies pain ? ?  ? ?BMI - recorded: 29.7 ?Nutritional Risks: None ?Diabetes: No ? ?How often do you need to have someone help you when you read instructions, pamphlets, or other  written materials from your doctor or pharmacy?: 1 - Never ? ?Diabetic? No ? ? ? ?Activities of Daily Living ? ?  06/06/2021  ? 11:28 AM 06/03/2021  ? 12:47 PM  ?In your present state of health, do you have any difficulty performing the following activities:  ?Hearing? 0 0  ?Vision? 0 0  ?Difficulty concentrating or making decisions? 0 0  ?Walking or climbing stairs? 0 0  ?Dressing or bathing? 0 0  ?Doing errands, shopping? 0 0  ?Preparing Food and eating ? N N  ?Using the Toilet? N N  ?In the past six months, have you accidently leaked urine? Y Y  ?Comment Followed by PCP   ?Do you have problems with loss of bowel control? Y Y  ?Comment Followed by PCP   ?Managing your Medications? N N  ?Managing your Finances? N N  ?Housekeeping or managing your Housekeeping? N N  ? ? ?Patient Care Team: ?Caren Macadam, MD as PCP - General (Family Medicine) ? ?Indicate any recent Medical Services you may have received from other than [facility] providers in the past year (date may be approximate). ? ?   ?Assessment:  ? This is a routine wellness examination for Pamila. ? ?Hearing/Vision screen ?Hearing Screening - Comments:: No hearing difficulty ?Vision Screening - Comments:: Wears reading glasses. Followed by Dr Katy Fitch ? ?Dietary issues and exercise activities discussed: ?Exercise limited by: None identified ? ? Goals Addressed   ? ?  ?  ?  ?  ?  ? This Visit's Progress  ?   Increase physical activity  (pt-stated)     ?   I would like to lose weight. ?  ? ?  ? ?Depression Screen ? ?  06/06/2021  ? 11:26 AM 05/18/2021  ?  3:55 PM 11/17/2020  ?  4:34 PM 06/04/2020  ?  1:29 PM 06/04/2020  ?  1:26 PM 03/24/2020  ?  8:09 A

## 2021-06-06 NOTE — Patient Instructions (Addendum)
?Ms. Crotty , ?Thank you for taking time to come for your Medicare Wellness Visit. I appreciate your ongoing commitment to your health goals. Please review the following plan we discussed and let me know if I can assist you in the future.  ? ?These are the goals we discussed: ? Goals   ? ?   Develop a Weight Loss Readiness Plan   ?   Lose 15 lbs  ?  ?   Increase physical activity (pt-stated)   ?   I would like to lose weight. ?  ? ?  ?  ?This is a list of the screening recommended for you and due dates:  ?Health Maintenance  ?Topic Date Due  ? Complete foot exam   03/24/2021  ? Urine Protein Check  03/24/2021  ? COVID-19 Vaccine (3 - Moderna risk series) 06/22/2021*  ? Zoster (Shingles) Vaccine (2 of 2) 09/06/2021*  ? Pneumonia Vaccine (2 - PPSV23 if available, else PCV20) 06/07/2022*  ? Flu Shot  08/30/2021  ? Hemoglobin A1C  11/17/2021  ? Mammogram  12/07/2021  ? Eye exam for diabetics  03/02/2022  ? Colon Cancer Screening  05/31/2026  ? Tetanus Vaccine  07/20/2026  ? DEXA scan (bone density measurement)  Completed  ? Hepatitis C Screening: USPSTF Recommendation to screen - Ages 54-79 yo.  Completed  ? HPV Vaccine  Aged Out  ?*Topic was postponed. The date shown is not the original due date.  ? ? ?Advanced directives: Yes Copies on file ? ?Conditions/risks identified: None ? ?Next appointment: Follow up in one year for your annual wellness visit  ? ? ?Preventive Care 33 Years and Older, Female ?Preventive care refers to lifestyle choices and visits with your health care provider that can promote health and wellness. ?What does preventive care include? ?A yearly physical exam. This is also called an annual well check. ?Dental exams once or twice a year. ?Routine eye exams. Ask your health care provider how often you should have your eyes checked. ?Personal lifestyle choices, including: ?Daily care of your teeth and gums. ?Regular physical activity. ?Eating a healthy diet. ?Avoiding tobacco and drug use. ?Limiting  alcohol use. ?Practicing safe sex. ?Taking low-dose aspirin every day. ?Taking vitamin and mineral supplements as recommended by your health care provider. ?What happens during an annual well check? ?The services and screenings done by your health care provider during your annual well check will depend on your age, overall health, lifestyle risk factors, and family history of disease. ?Counseling  ?Your health care provider may ask you questions about your: ?Alcohol use. ?Tobacco use. ?Drug use. ?Emotional well-being. ?Home and relationship well-being. ?Sexual activity. ?Eating habits. ?History of falls. ?Memory and ability to understand (cognition). ?Work and work Statistician. ?Reproductive health. ?Screening  ?You may have the following tests or measurements: ?Height, weight, and BMI. ?Blood pressure. ?Lipid and cholesterol levels. These may be checked every 5 years, or more frequently if you are over 37 years old. ?Skin check. ?Lung cancer screening. You may have this screening every year starting at age 57 if you have a 30-pack-year history of smoking and currently smoke or have quit within the past 15 years. ?Fecal occult blood test (FOBT) of the stool. You may have this test every year starting at age 48. ?Flexible sigmoidoscopy or colonoscopy. You may have a sigmoidoscopy every 5 years or a colonoscopy every 10 years starting at age 32. ?Hepatitis C blood test. ?Hepatitis B blood test. ?Sexually transmitted disease (STD) testing. ?Diabetes screening. This  is done by checking your blood sugar (glucose) after you have not eaten for a while (fasting). You may have this done every 1-3 years. ?Bone density scan. This is done to screen for osteoporosis. You may have this done starting at age 36. ?Mammogram. This may be done every 1-2 years. Talk to your health care provider about how often you should have regular mammograms. ?Talk with your health care provider about your test results, treatment options, and if  necessary, the need for more tests. ?Vaccines  ?Your health care provider may recommend certain vaccines, such as: ?Influenza vaccine. This is recommended every year. ?Tetanus, diphtheria, and acellular pertussis (Tdap, Td) vaccine. You may need a Td booster every 10 years. ?Zoster vaccine. You may need this after age 59. ?Pneumococcal 13-valent conjugate (PCV13) vaccine. One dose is recommended after age 61. ?Pneumococcal polysaccharide (PPSV23) vaccine. One dose is recommended after age 55. ?Talk to your health care provider about which screenings and vaccines you need and how often you need them. ?This information is not intended to replace advice given to you by your health care provider. Make sure you discuss any questions you have with your health care provider. ?Document Released: 02/12/2015 Document Revised: 10/06/2015 Document Reviewed: 11/17/2014 ?Elsevier Interactive Patient Education ? 2017 [place]. ? ?Fall Prevention in the Home ?Falls can cause injuries. They can happen to people of all ages. There are many things you can do to make your home safe and to help prevent falls. ?What can I do on the outside of my home? ?Regularly fix the edges of walkways and driveways and fix any cracks. ?Remove anything that might make you trip as you walk through a door, such as a raised step or threshold. ?Trim any bushes or trees on the path to your home. ?Use bright outdoor lighting. ?Clear any walking paths of anything that might make someone trip, such as rocks or tools. ?Regularly check to see if handrails are loose or broken. Make sure that both sides of any steps have handrails. ?Any raised decks and porches should have guardrails on the edges. ?Have any leaves, snow, or ice cleared regularly. ?Use sand or salt on walking paths during winter. ?Clean up any spills in your garage right away. This includes oil or grease spills. ?What can I do in the bathroom? ?Use night lights. ?Install grab bars by the toilet  and in the tub and shower. Do not use towel bars as grab bars. ?Use non-skid mats or decals in the tub or shower. ?If you need to sit down in the shower, use a plastic, non-slip stool. ?Keep the floor dry. Clean up any water that spills on the floor as soon as it happens. ?Remove soap buildup in the tub or shower regularly. ?Attach bath mats securely with double-sided non-slip rug tape. ?Do not have throw rugs and other things on the floor that can make you trip. ?What can I do in the bedroom? ?Use night lights. ?Make sure that you have a light by your bed that is easy to reach. ?Do not use any sheets or blankets that are too big for your bed. They should not hang down onto the floor. ?Have a firm chair that has side arms. You can use this for support while you get dressed. ?Do not have throw rugs and other things on the floor that can make you trip. ?What can I do in the kitchen? ?Clean up any spills right away. ?Avoid walking on wet floors. ?Keep items that  you use a lot in easy-to-reach places. ?If you need to reach something above you, use a strong step stool that has a grab bar. ?Keep electrical cords out of the way. ?Do not use floor polish or wax that makes floors slippery. If you must use wax, use non-skid floor wax. ?Do not have throw rugs and other things on the floor that can make you trip. ?What can I do with my stairs? ?Do not leave any items on the stairs. ?Make sure that there are handrails on both sides of the stairs and use them. Fix handrails that are broken or loose. Make sure that handrails are as long as the stairways. ?Check any carpeting to make sure that it is firmly attached to the stairs. Fix any carpet that is loose or worn. ?Avoid having throw rugs at the top or bottom of the stairs. If you do have throw rugs, attach them to the floor with carpet tape. ?Make sure that you have a light switch at the top of the stairs and the bottom of the stairs. If you do not have them, ask someone to add  them for you. ?What else can I do to help prevent falls? ?Wear shoes that: ?Do not have high heels. ?Have rubber bottoms. ?Are comfortable and fit you well. ?Are closed at the toe. Do not wear sandals

## 2021-06-08 NOTE — Progress Notes (Signed)
Agree with the assessment and plan as outlined by Paula Guenther, NP. ° °Sacred Roa, DO, FACG ° °

## 2021-06-22 ENCOUNTER — Ambulatory Visit (INDEPENDENT_AMBULATORY_CARE_PROVIDER_SITE_OTHER): Payer: Medicare Other | Admitting: Podiatry

## 2021-06-22 ENCOUNTER — Telehealth: Payer: Self-pay | Admitting: Nurse Practitioner

## 2021-06-22 DIAGNOSIS — E1142 Type 2 diabetes mellitus with diabetic polyneuropathy: Secondary | ICD-10-CM

## 2021-06-22 DIAGNOSIS — B351 Tinea unguium: Secondary | ICD-10-CM

## 2021-06-22 DIAGNOSIS — M79674 Pain in right toe(s): Secondary | ICD-10-CM | POA: Diagnosis not present

## 2021-06-22 DIAGNOSIS — M79675 Pain in left toe(s): Secondary | ICD-10-CM

## 2021-06-22 NOTE — Telephone Encounter (Signed)
Inbound call from patient seeking advice if we received records from [place]. Patient stated at her OV with Nevin Bloodgood on 5/5 she would have them sent over. Please advise.

## 2021-06-22 NOTE — Progress Notes (Signed)
  Subjective:  Patient ID: Renee Bauer, female    DOB: 03/31/1950,  MRN: [id number]  Chief Complaint  Patient presents with   Nail Problem    Nail trim    71 y.o. female returns for the above complaint.  Patient presents with thickened elongated dystrophic toenails x10.  Pain full to touch.  She is a diabetic with last A1c of 5.7.  She would like to have her nails debrided down.  She denies any other acute complaints.  Objective:  There were no vitals filed for this visit. Podiatric Exam: Vascular: dorsalis pedis and posterior tibial pulses are palpable bilateral. Capillary return is immediate. Temperature gradient is WNL. Skin turgor WNL  Sensorium: Normal Semmes Weinstein monofilament test. Normal tactile sensation bilaterally. Nail Exam: Pt has thick disfigured discolored nails with subungual debris noted bilateral entire nail hallux through fifth toenails.  Except left second digit due to previous total nail avulsion.  Pain on palpation to the nails. Ulcer Exam: There is no evidence of ulcer or pre-ulcerative changes or infection. Orthopedic Exam: Muscle tone and strength are WNL. No limitations in general ROM. No crepitus or effusions noted. HAV  B/L.  Hammer toes 2-5  B/L. Skin: No Porokeratosis. No infection or ulcers    Assessment & Plan:   1. Pain due to onychomycosis of toenails of both feet   2. Controlled type 2 diabetes mellitus with diabetic polyneuropathy, without long-term current use of insulin ([place])       Patient was evaluated and treated and all questions answered.  Onychomycosis with pain  -Nails palliatively debrided as below. -Educated on self-care  Procedure: Nail Debridement Rationale: pain  Type of Debridement: manual, sharp debridement. Instrumentation: Nail nipper, rotary burr. Number of Nails: 9  Procedures and Treatment: Consent by patient was obtained for treatment procedures. The patient understood the discussion of treatment and procedures  well. All questions were answered thoroughly reviewed. Debridement of mycotic and hypertrophic toenails, 1 through 5 bilateral and clearing of subungual debris. No ulceration, no infection noted.  Return Visit-Office Procedure: Patient instructed to return to the office for a follow up visit 3 months for continued evaluation and treatment.  Boneta Lucks, DPM    No follow-ups on file.

## 2021-06-23 NOTE — Telephone Encounter (Addendum)
Records were received. Records are in Paula's office. Pt has been informed.

## 2021-06-27 ENCOUNTER — Encounter: Payer: Self-pay | Admitting: Family Medicine

## 2021-06-27 ENCOUNTER — Other Ambulatory Visit: Payer: Self-pay | Admitting: Family Medicine

## 2021-06-30 ENCOUNTER — Ambulatory Visit (INDEPENDENT_AMBULATORY_CARE_PROVIDER_SITE_OTHER): Payer: Medicare Other | Admitting: Nurse Practitioner

## 2021-06-30 ENCOUNTER — Encounter: Payer: Self-pay | Admitting: Nurse Practitioner

## 2021-06-30 VITALS — BP 108/76 | HR 96 | Ht 61.25 in | Wt 158.4 lb

## 2021-06-30 DIAGNOSIS — Z86010 Personal history of colonic polyps: Secondary | ICD-10-CM | POA: Diagnosis not present

## 2021-06-30 DIAGNOSIS — R159 Full incontinence of feces: Secondary | ICD-10-CM | POA: Diagnosis not present

## 2021-06-30 NOTE — Progress Notes (Signed)
Assessment   Patient Profile:  Renee Bauer is a 71 y.o. female known to Dr. Bryan Lemma with a past medical history of adenomatous colon polyps, diet controlled diabetes, HLD, anxiety,  depression, rosacea, hysterectomy, colon resection for rectal prolapse and GERD.  Additional medical history as listed in [place] .  Fecal leakage.  It was thought that she may have internal hemorrhoids which can cause leakage but trial of steroid cream wasn't helpful. Leakage possibly secondary to incomplete rectal emptying though her rectal exam last visit didn't seem compatible with obstructed defecation  History of adenomatous colon polyps.  A 7 mm adenomatous polyp ( at 65 cm) was removed in 2018. She has no [facility] of colon cancer. Based on polyp surveillance guidelines she will likely need a 7 year interval surveillance colonoscopy in 2025.   History of remote colon resection for rectal prolapse.  Records not available.   Plan  She will use Glycerin supp as needed to facilitate complete rectal emptying. If no improvement in fecal leakage over the next 2-3 weeks she will let us know. At that point will schedule for a colonoscopy to rule out development of any interval lesions.  If colonoscopy negative and symptoms persist will refer to pelvic floor PT  HPI   Chief Complaint : follow up on fecal leakage.   Patient established care here on 06/03/2021 for evaluation of fecal leakage and history of colon polyps.  At the time of that visit she was treated empirically for internal hemorrhoids to see if this would help with some of the fecal leakage.  We requested records from her colorectal surgeon in [place] regarding colon resection.  She was given a follow-up appointment  Records received from [facility] in [place], [place]. She had a screening colonoscopy March 2018 by Sherron Monday, MD. Extent of exam was to the cecum.  Adequate prep.  A 7 mm sessile polyp was removed at 65 cm., scattered  diverticula throughout the sigmoid colon.  Polyp was adenomatous  Interval history :  She used the Anusol for 10 days without any improvement in fecal leakage. Still having leakage 3-4 times a week. She leaks an amount about the size of a quarter. She sometimes feels the stool coming out, other times not. Episodes are random. Can happen when laying in bed or when walking around. When I saw her last month the leakage didn't seem related to incomplete rectal emptying but with further questioning today she may see a correlation. She thinks the fecal leakage may in fact occur on days she doesn't empty bowels well. Stools are generally soft and formed. She tends to have a bowel movement every day.Some days she has to return to bathroom 2 or 3 more times for a BM.   Labs:     Latest Ref Rng & Units 05/18/2021    3:52 PM 12/01/2020   11:19 AM 03/24/2020    9:06 AM  CBC  WBC 4.0 - 10.5 K/uL 7.0   7.4   6.3    Hemoglobin 12.0 - 15.0 g/dL 14.2   13.7   14.5    Hematocrit 36.0 - 46.0 % 43.1   41.5   42.8    Platelets 150.0 - 400.0 K/uL 216.0   265.0   267.0         Latest Ref Rng & Units 05/18/2021    3:52 PM 12/01/2020   11:19 AM 03/24/2020    9:06 AM  Hepatic Function  Total Protein  6.0 - 8.3 g/dL 7.2   6.8   7.0    Albumin 3.5 - 5.2 g/dL 4.4   4.2   4.4    AST 0 - 37 U/L '18   14   17    ' ALT 0 - 35 U/L '12   9   13    ' Alk Phosphatase 39 - 117 U/L 78   72   70    Total Bilirubin 0.2 - 1.2 mg/dL 0.5   0.4   0.5       Past Medical History:  Diagnosis Date   Anxiety    Arthritis    Benign colon polyp 2018   Bursitis    Depression    Diabetes mellitus without complication (HCC)    GERD (gastroesophageal reflux disease)    Hyperlipidemia    Rosacea     Past Surgical History:  Procedure Laterality Date   ABDOMINAL HYSTERECTOMY     menorrhagia   BLADDER REPAIR     BREAST BIOPSY Left    BUNIONECTOMY Right    COLON RESECTION  2007   DILATION AND CURETTAGE OF UTERUS     TOTAL KNEE  ARTHROPLASTY Left 09/29/2020    Current Medications, Allergies, Family History and Social History were reviewed in [employer] record.     Current Outpatient Medications  Medication Sig Dispense Refill   ALPRAZolam (XANAX) 1 MG tablet TAKE 1 TABLET AS NEEDED FOR ANXIETY 30 tablet 2   betamethasone acetate-betamethasone sodium phosphate (CELESTONE) 6 (3-3) MG/ML injection Inject into the articular space.     CALCIUM CITRATE PO Take 600 mg by mouth daily.     Cholecalciferol (VITAMIN D3 PO) Take 2,000 Units by mouth daily.     esomeprazole (NEXIUM) 40 MG capsule Take 1 capsule by mouth once daily 90 capsule 0   fluticasone (FLONASE) 50 MCG/ACT nasal spray Place 2 sprays into both nostrils daily. 48 g 3   fluvoxaMINE (LUVOX) 50 MG tablet Take 1 tablet (50 mg total) by mouth at bedtime. 90 tablet 3   gabapentin (NEURONTIN) 100 MG capsule TAKE 1 CAPSULE THREE TIMES A DAY 90 capsule 11   lidocaine-prilocaine (EMLA) cream APPLY 1 APPLICATION TOPICALLY AS NEEDED 30 g 11   meloxicam (MOBIC) 7.5 MG tablet Take 1 tablet (7.5 mg total) by mouth 2 (two) times daily as needed for pain. 180 tablet 0   METRONIDAZOLE, TOPICAL, 0.75 % LOTN as needed.      montelukast (SINGULAIR) 10 MG tablet Take 1 tablet (10 mg total) by mouth at bedtime. (Patient taking differently: Take 10 mg by mouth as needed.) 90 tablet 3   OVER THE COUNTER MEDICATION Zeasorb powder     rosuvastatin (CRESTOR) 10 MG tablet TAKE 1 TABLET AT BEDTIME 90 tablet 1   TERBINAFINE EX Apply topically.     vitamin B-12 (CYANOCOBALAMIN) 1000 MCG tablet Take 1,000 mcg by mouth daily.     zolpidem (AMBIEN CR) 6.25 MG CR tablet Take 1 tablet (6.25 mg total) by mouth at bedtime as needed for sleep. 90 tablet 3   No current facility-administered medications for this visit.    Review of Systems: No chest pain. No shortness of breath. No urinary complaints.    Physical Exam  Wt Readings from Last 3 Encounters:  06/06/21  162 lb (73.5 kg)  06/03/21 162 lb 6.4 oz (73.7 kg)  05/23/21 160 lb (72.6 kg)    BP 108/76 (BP Location: Left Arm, Patient Position: Sitting, Cuff Size: Normal)  Pulse 96   Ht 5' 1.25" (1.556 m) Comment: height measured without shoes  Wt 158 lb 6 oz (71.8 kg)   BMI 29.68 kg/m  Constitutional:  Generally well appearing female in no acute distress. Psychiatric: Pleasant. Normal mood and affect. Behavior is normal. EENT: Pupils normal.  Conjunctivae are normal. No scleral icterus. Neck supple.  Cardiovascular: Normal rate, regular rhythm. No edema Pulmonary/chest: Effort normal and breath sounds normal. No wheezing, rales or rhonchi. Abdominal: Soft, nondistended, nontender. Bowel sounds active throughout. There are no masses palpable. No hepatomegaly. Neurological: Alert and oriented to person place and time. Skin: Skin is warm and dry. No rashes noted.  Tye Savoy, NP  06/30/2021, 10:34 AM

## 2021-06-30 NOTE — Progress Notes (Signed)
Agree with the assessment and plan as outlined by Paula Guenther, NP. ° °Jariana Shumard, DO, FACG ° °

## 2021-06-30 NOTE — Patient Instructions (Addendum)
Glycerin suppositories (over the counter) as needed to facilitate rectal emptying. If no improvement over the next 2-3 weeks let me know and will schedule you or a colonoscopy for further evaluation. If colonoscopy negative and symptoms persist, we will refer you to pelvic floor physical therapy  If you are age 69 or older, your body mass index should be between 23-30. Your Body mass index is 29.68 kg/m. If this is out of the aforementioned range listed, please consider follow up with your Primary Care Provider.  If you are age 57 or younger, your body mass index should be between 19-25. Your Body mass index is 29.68 kg/m. If this is out of the aformentioned range listed, please consider follow up with your Primary Care Provider.   ________________________________________________________  The [facility] providers would like to encourage you to use [facility] to communicate with providers for non-urgent requests or questions.  Due to long hold times on the telephone, sending your provider a message by [facility] may be a faster and more efficient way to get a response.  Please allow 48 business hours for a response.  Please remember that this is for non-urgent requests.  _______________________________________________________  Due to recent changes in healthcare laws, you may see the results of your imaging and laboratory studies on MyChart before your provider has had a chance to review them.  We understand that in some cases there may be results that are confusing or concerning to you. Not all laboratory results come back in the same time frame and the provider may be waiting for multiple results in order to interpret others.  Please give [place] 48 hours in order for your provider to thoroughly review all the results before contacting the office for clarification of your results.  Marland Kitchen

## 2021-07-04 NOTE — Progress Notes (Signed)
[name] [address] [place] [address] Phone: [phone] Subjective:   Bruce Donath, am serving as a scribe for Dr. Antoine Primas.   I'm seeing this patient by the request  of:  Wynn Banker, MD (Inactive)  CC: bilateral hip pain   [id number]  05/23/2021 Bilateral injections given today, tolerated the procedure well, will rule out any underlying arthritic changes with x-ray today.  In addition to this we discussed home exercise but patient has responded much better to formal physical therapy over the years.  We will get patient back into formal physical therapy.  Encouraged her to continue the gabapentin, discussed over-the-counter natural supplementations that could be helpful.  Follow-up with me again 6 to 8 weeks.  Worsening pain will consider the possibility of further evaluation of lumbar radiculopathy but hopefully not with patient already having a history of multiple surgeries.  Updated 07/06/2021 Renee Bauer is a 71 y.o. female coming in with complaint of bilateral hip pain. Patient states that she is improving. Doing PT and feels injections also helped.  Complaining of L knee pain. TKR August 2022. Started having tightness in knee 2 months ago. New new injury or change in activity level. Saw her surgeon who gave her pes anserine injection. This did not help pain.         Past Medical History:  Diagnosis Date   Anxiety    Arthritis    Benign colon polyp 2018   Bursitis    Depression    Diabetes mellitus without complication (HCC)    GERD (gastroesophageal reflux disease)    Hyperlipidemia    Rosacea    Past Surgical History:  Procedure Laterality Date   ABDOMINAL HYSTERECTOMY     menorrhagia   BLADDER REPAIR     BREAST BIOPSY Left    BUNIONECTOMY Right    COLON RESECTION  2007   DILATION AND CURETTAGE OF UTERUS     TOTAL KNEE ARTHROPLASTY Left 09/29/2020   Social History   Socioeconomic History    Marital status: Married    Spouse name: Not on file   Number of children: 2   Years of education: Not on file   Highest education level: Not on file  Occupational History   Occupation: Retired  Tobacco Use   Smoking status: Never   Smokeless tobacco: Never  Vaping Use   Vaping Use: Never used  Substance and Sexual Activity   Alcohol use: Yes    Comment: occasional wine   Drug use: Never   Sexual activity: Not on file  Other Topics Concern   Not on file  Social History Narrative   Not on file   Social Determinants of Health   Financial Resource Strain: Low Risk    Difficulty of Paying Living Expenses: Not hard at all  Food Insecurity: No Food Insecurity   Worried About Programme researcher, broadcasting/film/video in the Last Year: Never true   Ran Out of Food in the Last Year: Never true  Transportation Needs: No Transportation Needs   Lack of Transportation (Medical): No   Lack of Transportation (Non-Medical): No  Physical Activity: Sufficiently Active   Days of Exercise per Week: 5 days   Minutes of Exercise per Session: 50 min  Stress: No Stress Concern Present   Feeling of Stress : Only a little  Social Connections: Press photographer of Communication with Friends and Family: Twice a week   Frequency of Social Gatherings  with Friends and Family: Once a week   Attends Religious Services: More than 4 times per year   Active Member of Clubs or Organizations: Yes   Attends Engineer, structural: More than 4 times per year   Marital Status: Married   Allergies  Allergen Reactions   Tramadol     Felt sick with this   Wellbutrin [Bupropion]     Muscle twitches   Clindamycin Hcl Rash   Family History  Problem Relation Age of Onset   Diabetes Mother    Arrhythmia Mother    High blood pressure Mother    Mesothelioma Father        asbestos exposure   Allergic rhinitis Father    Sinusitis Father    Healthy Sister    Post-traumatic stress disorder Son    Scoliosis  Son    Brain cancer Maternal Aunt    Asthma Neg Hx    Eczema Neg Hx    Urticaria Neg Hx    Angioedema Neg Hx    Immunodeficiency Neg Hx    Atopy Neg Hx    Colon cancer Neg Hx    Esophageal cancer Neg Hx    Stomach cancer Neg Hx    Colon polyps Neg Hx     Current Outpatient Medications (Endocrine & Metabolic):    betamethasone acetate-betamethasone sodium phosphate (CELESTONE) 6 (3-3) MG/ML injection, Inject into the articular space.  Current Outpatient Medications (Cardiovascular):    rosuvastatin (CRESTOR) 10 MG tablet, TAKE 1 TABLET AT BEDTIME  Current Outpatient Medications (Respiratory):    fluticasone (FLONASE) 50 MCG/ACT nasal spray, Place 2 sprays into both nostrils daily.   montelukast (SINGULAIR) 10 MG tablet, Take 1 tablet (10 mg total) by mouth at bedtime. (Patient taking differently: Take 10 mg by mouth as needed.)  Current Outpatient Medications (Analgesics):    meloxicam (MOBIC) 7.5 MG tablet, Take 1 tablet (7.5 mg total) by mouth 2 (two) times daily as needed for pain.  Current Outpatient Medications (Hematological):    vitamin B-12 (CYANOCOBALAMIN) 1000 MCG tablet, Take 1,000 mcg by mouth daily.  Current Outpatient Medications (Other):    ALPRAZolam (XANAX) 1 MG tablet, TAKE 1 TABLET AS NEEDED FOR ANXIETY   CALCIUM CITRATE PO, Take 600 mg by mouth daily.   Cholecalciferol (VITAMIN D3 PO), Take 2,000 Units by mouth daily.   esomeprazole (NEXIUM) 40 MG capsule, Take 1 capsule by mouth once daily   fluvoxaMINE (LUVOX) 50 MG tablet, Take 1 tablet (50 mg total) by mouth at bedtime.   gabapentin (NEURONTIN) 100 MG capsule, TAKE 1 CAPSULE THREE TIMES A DAY   lidocaine-prilocaine (EMLA) cream, APPLY 1 APPLICATION TOPICALLY AS NEEDED   METRONIDAZOLE, TOPICAL, 0.75 % LOTN, as needed.    OVER THE COUNTER MEDICATION, Zeasorb powder   TERBINAFINE EX, Apply topically.   zolpidem (AMBIEN CR) 6.25 MG CR tablet, Take 1 tablet (6.25 mg total) by mouth at bedtime as needed for  sleep.   Reviewed prior external information including notes and imaging from  primary care provider As well as notes that were available from care everywhere and other healthcare systems.  Past medical history, social, surgical and family history all reviewed in electronic medical record.  No pertanent information unless stated regarding to the chief complaint.   Review of Systems:  No headache, visual changes, nausea, vomiting, diarrhea, constipation, dizziness, abdominal pain, skin rash, fevers, chills, night sweats, weight loss, swollen lymph nodes, body aches, joint swelling, chest pain, shortness of breath, mood changes. POSITIVE  muscle aches  Objective  Blood pressure 116/80, pulse 71, height 5\' 1"  (1.549 m), weight 158 lb (71.7 kg), SpO2 98 %.   General: No apparent distress alert and oriented x3 mood and affect normal, dressed appropriately.  HEENT: Pupils equal, extraocular movements intact  Respiratory: Patient's speak in full sentences and does not appear short of breath  Cardiovascular: No lower extremity edema, non tender, no erythema  Gait antalgic  MSK:  trace effusion noted.  95 degrees of flexion, instability noted    Limited muscular skeletal ultrasound was performed and interpreted by Antoine Primas, M  Hypoechoic changes noted in the patellofemoral area.  Trace noted.  Patient does have calcific changes noted that seem to be the quadricep tendon at the insertion.  Calcific changes noted as well over the medial joint space. 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

## 2021-07-06 ENCOUNTER — Ambulatory Visit: Payer: Self-pay

## 2021-07-06 ENCOUNTER — Encounter: Payer: Self-pay | Admitting: Family Medicine

## 2021-07-06 ENCOUNTER — Ambulatory Visit (INDEPENDENT_AMBULATORY_CARE_PROVIDER_SITE_OTHER): Payer: Medicare Other | Admitting: Family Medicine

## 2021-07-06 ENCOUNTER — Ambulatory Visit (INDEPENDENT_AMBULATORY_CARE_PROVIDER_SITE_OTHER): Payer: Medicare Other

## 2021-07-06 VITALS — BP 116/80 | HR 71 | Ht 61.0 in | Wt 158.0 lb

## 2021-07-06 DIAGNOSIS — M25551 Pain in right hip: Secondary | ICD-10-CM

## 2021-07-06 DIAGNOSIS — M25552 Pain in left hip: Secondary | ICD-10-CM

## 2021-07-06 DIAGNOSIS — Z96652 Presence of left artificial knee joint: Secondary | ICD-10-CM

## 2021-07-06 DIAGNOSIS — G8929 Other chronic pain: Secondary | ICD-10-CM

## 2021-07-06 DIAGNOSIS — M25562 Pain in left knee: Secondary | ICD-10-CM

## 2021-07-06 DIAGNOSIS — M2550 Pain in unspecified joint: Secondary | ICD-10-CM

## 2021-07-06 LAB — C-REACTIVE PROTEIN: CRP: <1 mg/dL (ref 0.5–20.0)

## 2021-07-06 LAB — URIC ACID: Uric Acid, Serum: 1.9 mg/dL — ABNORMAL LOW (ref 2.4–7.0)

## 2021-07-06 LAB — VITAMIN D 25 HYDROXY (VIT D DEFICIENCY, FRACTURES): VITD: 66.55 ng/mL (ref 30.00–100.00)

## 2021-07-06 LAB — SEDIMENTATION RATE: Sed Rate: 15 mm/hr (ref 0–30)

## 2021-07-06 NOTE — Patient Instructions (Addendum)
Xray today Shockwave therapy with Dr. Glennon Mac or Dr. Bishop Limbo today See me in 6 weeks

## 2021-07-06 NOTE — Assessment & Plan Note (Signed)
Patient does have some calcific changes noted at this point.  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  We will see how patient responds to shockwave therapy.  Discussed vitamin D supplementation, will get laboratory work-up to make sure patient does not toxemia.  On ultrasound today patient did have small effusion of the placement so we will check on him and cobalt as well as we will get an x-ray to further evaluate but likely is stable on exam today follow-up with me again in 4 weeks

## 2021-07-07 ENCOUNTER — Ambulatory Visit (INDEPENDENT_AMBULATORY_CARE_PROVIDER_SITE_OTHER): Payer: Self-pay | Admitting: Sports Medicine

## 2021-07-07 ENCOUNTER — Other Ambulatory Visit: Payer: Self-pay

## 2021-07-07 DIAGNOSIS — M25562 Pain in left knee: Secondary | ICD-10-CM

## 2021-07-07 DIAGNOSIS — M7652 Patellar tendinitis, left knee: Secondary | ICD-10-CM

## 2021-07-07 DIAGNOSIS — G8929 Other chronic pain: Secondary | ICD-10-CM

## 2021-07-07 NOTE — Progress Notes (Signed)
   [facility] [place] Phone: [phone]   Extracorporeal Shockwave Therapy Note    Patient is being treated today with ECSWT. Informed consent was obtained and patient tolerated procedure well.   Therapy performed by Glennon Mac  Condition treated: Focused on left patellar tendinitis, with additional treatments of pes anserine region, quad tendon Treatment preset used: Acute patellar tendinitis Energy used: 90 mJ Frequency used: 10 hz Number of pulses: 2000 Treatment #1 of #3  Electronically signed by:  [facility] 9:42 AM 07/07/21

## 2021-07-08 LAB — PTH, INTACT AND CALCIUM
Calcium: 10.2 mg/dL (ref 8.6–10.4)
PTH: 35 pg/mL (ref 16–77)

## 2021-07-08 LAB — CALCIUM, IONIZED: Calcium, Ion: 5.3 mg/dL (ref 4.7–5.5)

## 2021-07-13 ENCOUNTER — Telehealth: Payer: Self-pay | Admitting: Rheumatology

## 2021-07-13 LAB — CHROMIUM AND COBALT, WB (MOM)
Chromium: <1 ng/mL (ref <3.0)
Cobalt: <1 ng/mL (ref <3.0)

## 2021-07-13 NOTE — Telephone Encounter (Signed)
Patient called the office stating she is having a lot of pain in her back again and would like another injection. Patient states she last had it done a year ago by Dr. Estanislado Pandy but Dr. Estanislado Pandy has no availability. Patient would like to know if another provider can give the injection, like [place]. Please advise.

## 2021-07-14 ENCOUNTER — Encounter: Payer: Self-pay | Admitting: Rheumatology

## 2021-07-14 ENCOUNTER — Ambulatory Visit (INDEPENDENT_AMBULATORY_CARE_PROVIDER_SITE_OTHER): Payer: Medicare Other | Admitting: Rheumatology

## 2021-07-14 VITALS — BP 121/82 | HR 86 | Resp 15 | Ht 61.0 in | Wt 157.0 lb

## 2021-07-14 DIAGNOSIS — M7061 Trochanteric bursitis, right hip: Secondary | ICD-10-CM

## 2021-07-14 DIAGNOSIS — M8589 Other specified disorders of bone density and structure, multiple sites: Secondary | ICD-10-CM

## 2021-07-14 DIAGNOSIS — F32A Depression, unspecified: Secondary | ICD-10-CM

## 2021-07-14 DIAGNOSIS — Z86010 Personal history of colonic polyps: Secondary | ICD-10-CM

## 2021-07-14 DIAGNOSIS — G8929 Other chronic pain: Secondary | ICD-10-CM

## 2021-07-14 DIAGNOSIS — Z85828 Personal history of other malignant neoplasm of skin: Secondary | ICD-10-CM

## 2021-07-14 DIAGNOSIS — Z96652 Presence of left artificial knee joint: Secondary | ICD-10-CM

## 2021-07-14 DIAGNOSIS — M7062 Trochanteric bursitis, left hip: Secondary | ICD-10-CM

## 2021-07-14 DIAGNOSIS — M5136 Other intervertebral disc degeneration, lumbar region: Secondary | ICD-10-CM

## 2021-07-14 DIAGNOSIS — M19071 Primary osteoarthritis, right ankle and foot: Secondary | ICD-10-CM

## 2021-07-14 DIAGNOSIS — M1711 Unilateral primary osteoarthritis, right knee: Secondary | ICD-10-CM

## 2021-07-14 DIAGNOSIS — F419 Anxiety disorder, unspecified: Secondary | ICD-10-CM

## 2021-07-14 DIAGNOSIS — M19042 Primary osteoarthritis, left hand: Secondary | ICD-10-CM

## 2021-07-14 DIAGNOSIS — M19072 Primary osteoarthritis, left ankle and foot: Secondary | ICD-10-CM

## 2021-07-14 DIAGNOSIS — M19041 Primary osteoarthritis, right hand: Secondary | ICD-10-CM | POA: Diagnosis not present

## 2021-07-14 DIAGNOSIS — Z8639 Personal history of other endocrine, nutritional and metabolic disease: Secondary | ICD-10-CM

## 2021-07-14 DIAGNOSIS — M533 Sacrococcygeal disorders, not elsewhere classified: Secondary | ICD-10-CM | POA: Diagnosis not present

## 2021-07-14 DIAGNOSIS — E1142 Type 2 diabetes mellitus with diabetic polyneuropathy: Secondary | ICD-10-CM

## 2021-07-14 DIAGNOSIS — G4709 Other insomnia: Secondary | ICD-10-CM

## 2021-07-14 DIAGNOSIS — G5793 Unspecified mononeuropathy of bilateral lower limbs: Secondary | ICD-10-CM

## 2021-07-14 DIAGNOSIS — Z8719 Personal history of other diseases of the digestive system: Secondary | ICD-10-CM

## 2021-07-14 MED ORDER — TRIAMCINOLONE ACETONIDE 40 MG/ML IJ SUSP
40.0000 mg | INTRAMUSCULAR | Status: AC | PRN
  Administered 2021-07-14: 40 mg via INTRA_ARTICULAR

## 2021-07-14 MED ORDER — LIDOCAINE HCL 1 % IJ SOLN
1.0000 mL | INTRAMUSCULAR | Status: AC | PRN
  Administered 2021-07-14: 1 mL

## 2021-07-14 NOTE — Progress Notes (Signed)
Office Visit Note  Patient: Renee Bauer             Date of Birth: Feb 22, 1950           MRN: [id number]             PCP: Caren Macadam, MD (Inactive) Referring: No ref. provider found Visit Date: 07/14/2021 Occupation: '@GUAROCC'$ @  Subjective:  Osteoarthritis (Wants SI joint inj)   History of Present Illness: Renee Bauer is a 71 y.o. female with history of osteoarthritis and degenerative disc disease.  She states about a month ago she was having severe pain in her bilateral trochanteric bursa.  She was seen by Dr. Tamala Julian who gave her bilateral trochanteric bursa injections and sent her for physical therapy.  Her symptoms are improving.  She states her SI joint pain has come back and she has been having increased pain and discomfort in her bilateral SI joints more on the left side.  She continues to have pain and discomfort in her left knee which has been replaced.  She was told there was some calcification around the knee joint.  She has discomfort in her right foot.  She had prior surgery on that foot before.  She continues to have some stiffness in her hands and her back due to arthritis.  Activities of Daily Living:  Patient reports morning stiffness for 30 minutes.   Patient Reports nocturnal pain.  Difficulty dressing/grooming: Denies Difficulty climbing stairs: Denies Difficulty getting out of chair: Reports Difficulty using hands for taps, buttons, cutlery, and/or writing: Denies  Review of Systems  Constitutional:  Negative for fatigue.  HENT:  Positive for mouth dryness.   Eyes:  Negative for dryness.  Respiratory:  Negative for shortness of breath.   Cardiovascular:  Negative for swelling in legs/feet.  Gastrointestinal:  Negative for constipation.  Endocrine: Negative for increased urination.  Genitourinary:  Negative for difficulty urinating.  Musculoskeletal:  Positive for joint pain, joint pain, morning stiffness and muscle tenderness. Negative for joint  swelling.  Skin:  Negative for color change, rash and sensitivity to sunlight.  Allergic/Immunologic: Negative for susceptible to infections.  Neurological:  Positive for numbness.  Hematological:  Positive for bruising/bleeding tendency.  Psychiatric/Behavioral:  Positive for depressed mood and sleep disturbance. The patient is nervous/anxious.     PMFS History:  Patient Active Problem List   Diagnosis Date Noted   Chronic knee pain 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 of left knee joint 07/06/2021   Greater trochanteric bursitis of both hips 05/23/2021   Primary osteoarthritis of both hands 07/06/2019   Primary osteoarthritis of both knees 07/06/2019   Unilateral primary osteoarthritis, left knee 10/31/2018   Arthritis 10/30/2018   DM (diabetes mellitus) type II controlled, neurological manifestation ([place]) 10/30/2018   Neuropathy of both feet 10/30/2018   Anxiety 10/30/2018   Insomnia 10/30/2018   History of skin cancer 10/30/2018   Osteopenia 09/30/2018    Past Medical History:  Diagnosis Date   Anxiety    Arthritis    Benign colon polyp 2018   Bursitis    Depression    Diabetes mellitus without complication (HCC)    GERD (gastroesophageal reflux disease)    Hyperlipidemia    Rosacea     Family History  Problem Relation Age of Onset   Diabetes Mother    Arrhythmia Mother    High blood pressure Mother    Mesothelioma Father        asbestos exposure   Allergic rhinitis Father  Sinusitis Father    Healthy Sister    Post-traumatic stress disorder Son    Scoliosis Son    Brain cancer Maternal Aunt    Asthma Neg Hx    Eczema Neg Hx    Urticaria Neg Hx    Angioedema Neg Hx    Immunodeficiency Neg Hx    Atopy Neg Hx    Colon cancer Neg Hx    Esophageal cancer Neg Hx    Stomach cancer Neg Hx    Colon polyps Neg Hx    Past Surgical History:  Procedure Laterality Date   ABDOMINAL HYSTERECTOMY     menorrhagia   BLADDER REPAIR     BREAST BIOPSY Left    BUNIONECTOMY Right     COLON RESECTION  2007   DILATION AND CURETTAGE OF UTERUS     TOTAL KNEE ARTHROPLASTY Left 09/29/2020   Social History   Social History Narrative   Not on file   Immunization History  Administered Date(s) Administered   Fluad Quad(high Dose 65+) 11/04/2018   Moderna Sars-Covid-2 Vaccination 06/10/2019, 07/06/2019   Pneumococcal Conjugate-13 11/04/2018   Td 07/19/2016   Zoster Recombinat (Shingrix) 06/10/2020     Objective: Vital Signs: BP 121/82 (BP Location: Left Arm, Patient Position: Sitting, Cuff Size: Normal)   Pulse 86   Resp 15   Ht '5\' 1"'$  (1.549 m)   Wt 157 lb (71.2 kg)   BMI 29.66 kg/m    Physical Exam Vitals and nursing note reviewed.  Constitutional:      Appearance: She is well-developed.  HENT:     Head: Normocephalic and atraumatic.  Eyes:     Conjunctiva/sclera: Conjunctivae normal.  Cardiovascular:     Rate and Rhythm: Normal rate and regular rhythm.     Heart sounds: Normal heart sounds.  Pulmonary:     Effort: Pulmonary effort is normal.     Breath sounds: Normal breath sounds.  Abdominal:     General: Bowel sounds are normal.     Palpations: Abdomen is soft.  Musculoskeletal:     Cervical back: Normal range of motion.  Lymphadenopathy:     Cervical: No cervical adenopathy.  Skin:    General: Skin is warm and dry.     Capillary Refill: Capillary refill takes less than 2 seconds.  Neurological:     Mental Status: She is alert and oriented to person, place, and time.  Psychiatric:        Behavior: Behavior normal.      Musculoskeletal Exam: C-spine thoracic and lumbar spine with good range of motion.  She had tenderness on palpation over bilateral SI joints.  She had tenderness on palpation of bilateral trochanteric bursa.  Shoulder joints, elbow joints, wrist joints with good range of motion.  She had bilateral PIP and DIP thickening.  Hip joints and good range of motion.  Left knee joint was replaced with some warmth on palpation.  Right  knee joint was in good range of motion without any discomfort.  There was no tenderness over ankles or MTPs.  CDAI Exam: CDAI Score: -- Patient Global: --; Provider Global: -- Swollen: --; Tender: -- Joint Exam 07/14/2021   No joint exam has been documented for this visit   There is currently no information documented on the homunculus. Go to the Rheumatology activity and complete the homunculus joint exam.  Investigation: No additional findings.  Imaging: [place] LIMITED JOINT SPACE STRUCTURES LOW LEFT(NO LINKED CHARGES)  Result Date: 07/08/2021 Limited muscular skeletal ultrasound was  performed and interpreted by Hulan Saas, M Hypoechoic changes noted in the patellofemoral area.  Trace noted.  Patient does have calcific changes noted that seem to be the quadricep tendon at the insertion.  Calcific changes noted as well over the medial joint space. Impression: Calcific tendinitis with a trace effusion of the joint replacement but no signs of infectious etiology  [place] LIMITED JOINT SPACE STRUCTURES LOW BILAT(NO LINKED CHARGES)  Result Date: 07/08/2021 Limited muscular skeletal ultrasound was performed and interpreted by Hulan Saas, M Hypoechoic changes noted in the patellofemoral area.  Trace noted.  Patient does have calcific changes noted that seem to be the quadricep tendon at the insertion.  Calcific changes noted as well over the medial joint space. Impression: Calcific tendinitis with a trace effusion of the joint replacement but no signs of infectious etiology  DG Knee AP/LAT W/[place] Left  Result Date: 07/07/2021 CLINICAL DATA:  L knee pain EXAM: LEFT KNEE 3 VIEWS COMPARISON:  March 2021 FINDINGS: Interval total knee arthroplasty. Hardware alignment appears within expected limits. There is small degree of lucency surrounding the inferior aspect of the tibial component. No acute fracture. Small knee joint effusion appears to be present. IMPRESSION: 1. Status post total knee  arthroplasty. Lucency surrounding the inferior aspect of the tibial component may suggest a degree of loosening. 2. Probable small knee joint effusion. Electronically Signed   By: Albin Felling M.D.   On: 07/07/2021 13:35    Recent Labs: Lab Results  Component Value Date   WBC 7.0 05/18/2021   HGB 14.2 05/18/2021   PLT 216.0 05/18/2021   NA 140 05/18/2021   K 3.6 05/18/2021   CL 103 05/18/2021   CO2 28 05/18/2021   GLUCOSE 89 05/18/2021   BUN 15 05/18/2021   CREATININE 0.86 05/18/2021   BILITOT 0.5 05/18/2021   ALKPHOS 78 05/18/2021   AST 18 05/18/2021   ALT 12 05/18/2021   PROT 7.2 05/18/2021   ALBUMIN 4.4 05/18/2021   CALCIUM 10.2 07/06/2021    Speciality Comments: No specialty comments available.  Procedures:  Sacroiliac Joint Inj on 07/14/2021 10:09 AM Indications: pain Details: 27 G 1.5 in needle, posterior approach Medications: 1 mL lidocaine 1 %; 40 mg triamcinolone acetonide 40 MG/ML Aspirate: 0 mL Outcome: tolerated well, no immediate complications Procedure, treatment alternatives, risks and benefits explained, specific risks discussed. Consent was given by the patient. Immediately prior to procedure a time out was called to verify the correct patient, procedure, equipment, support staff and site/side marked as required. Patient was prepped and draped in the usual sterile fashion.     Allergies: Tramadol, Wellbutrin [bupropion], and Clindamycin hcl   Assessment / Plan:     Visit Diagnoses: Primary osteoarthritis of both hands-she continues to have some pain and stiffness in her hands.  DIP thickening was noted.  Joint protection was discussed.  Trochanteric bursitis of both hips-she had bilateral trochanteric bursa injection by Dr. Tamala Julian recently which gave her some relief.  She is going to physical therapy.  Chronic left SI joint pain-she has been experiencing increased pain in her bilateral SI joints.  She states left SI joint is more painful.  Per patient's  request after informed consent was obtained left SI joint was injected with lidocaine and Kenalog.  She tolerated the procedure well.  Status post total knee replacement, -she continues to have some pain and discomfort.  Patient states she has Pilson calcification around her knee.    Primary osteoarthritis of right knee-no warmth swelling  or effusion was noted.  Primary osteoarthritis of both feet-she has intermittent discomfort in her feet.  Neuropathy of both feet  DDD (degenerative disc disease), lumbar-she gives history of discomfort off and on.  Osteopenia of multiple sites-patient states that she is scheduled for repeat DEXA scan.  Controlled type 2 diabetes mellitus with diabetic polyneuropathy, without long-term current use of insulin (HCC)-according to patient she is prediabetic.  She does not have elevation her blood sugar with cortisone injection.  History of hyperlipidemia  History of gastroesophageal reflux (GERD)  Anxiety and depression  Other insomnia  History of skin cancer  Hx of colonic polyps  Orders: Orders Placed This Encounter  Procedures   Sacroiliac Joint Inj   No orders of the defined types were placed in this encounter.    Follow-Up Instructions: Return in about 6 months (around 01/13/2022) for Osteoarthritis.   Bo Merino, MD  Note - This record has been created using Editor, commissioning.  Chart creation errors have been sought, but may not always  have been located. Such creation errors do not reflect on  the standard of medical care.

## 2021-07-15 ENCOUNTER — Ambulatory Visit (INDEPENDENT_AMBULATORY_CARE_PROVIDER_SITE_OTHER): Payer: Self-pay | Admitting: Sports Medicine

## 2021-07-15 DIAGNOSIS — M25562 Pain in left knee: Secondary | ICD-10-CM

## 2021-07-15 DIAGNOSIS — M76899 Other specified enthesopathies of unspecified lower limb, excluding foot: Secondary | ICD-10-CM

## 2021-07-15 DIAGNOSIS — M7652 Patellar tendinitis, left knee: Secondary | ICD-10-CM

## 2021-07-15 DIAGNOSIS — G8929 Other chronic pain: Secondary | ICD-10-CM

## 2021-07-15 NOTE — Progress Notes (Signed)
   [facility] [place] Phone: [phone]   Extracorporeal Shockwave Therapy Note    Patient is being treated today with ECSWT. Informed consent was obtained and patient tolerated procedure well.   Therapy performed by Glennon Mac  Condition treated: Focused on quadriceps tendon with additional treatments of patellar tendon and pes anserine bursa Treatment preset used: Acute patellar tendinitis Energy used: 90 mJ Frequency used: 10 hz Number of pulses: 2000 Treatment #2 of #3  Electronically signed by:  [facility] 9:28 AM 07/15/21

## 2021-07-20 NOTE — Telephone Encounter (Signed)
Agree. Ok to schedule colonoscopy w/ me, next available.

## 2021-07-20 NOTE — Telephone Encounter (Signed)
Spoke with pt. Pre-visit scheduled for 6/22 at 1:30 pm. Colon scheduled with Dr. Bryan Lemma on 7/6 at 3:30 pm.

## 2021-07-20 NOTE — Telephone Encounter (Signed)
Left message for pt to call back  °

## 2021-07-21 ENCOUNTER — Other Ambulatory Visit: Payer: Self-pay

## 2021-07-21 ENCOUNTER — Ambulatory Visit (AMBULATORY_SURGERY_CENTER): Payer: Self-pay | Admitting: *Deleted

## 2021-07-21 VITALS — Ht 61.25 in | Wt 167.0 lb

## 2021-07-21 DIAGNOSIS — R159 Full incontinence of feces: Secondary | ICD-10-CM

## 2021-07-21 DIAGNOSIS — Z86010 Personal history of colonic polyps: Secondary | ICD-10-CM

## 2021-07-21 MED ORDER — NA SULFATE-K SULFATE-MG SULF 17.5-3.13-1.6 GM/177ML PO SOLN: 1.0000 | Freq: Once | ORAL | 0 refills | Status: AC

## 2021-07-21 NOTE — Progress Notes (Signed)
No egg or soy allergy known to patient  No issues known to pt with past sedation with any surgeries or procedures Patient denies ever being told they had issues or difficulty with intubation  No FH of Malignant Hyperthermia Pt is not on diet pills Pt is not on  home 02  Pt is not on blood thinners  Pt denies issues with constipation  No A fib or A flutter   Discussed with pt there will be an out-of-pocket cost for prep and that varies from $0 to 70 +  dollars - pt verbalized understanding  Pt instructed to use Singlecare.com or GoodRx for a price reduction on prep   PV completed over the phone. Pt verified name, DOB, address and insurance during PV today.  Pt mailed instruction packet with copy of consent form to read and not return, and instructions.  Pt encouraged to call with questions or issues.  If pt has My chart, procedure instructions sent via My Chart  Insurance confirmed with pt at [facility] today

## 2021-07-22 ENCOUNTER — Ambulatory Visit (INDEPENDENT_AMBULATORY_CARE_PROVIDER_SITE_OTHER): Payer: Self-pay | Admitting: Sports Medicine

## 2021-07-22 DIAGNOSIS — G8929 Other chronic pain: Secondary | ICD-10-CM

## 2021-07-22 DIAGNOSIS — M7652 Patellar tendinitis, left knee: Secondary | ICD-10-CM

## 2021-07-22 DIAGNOSIS — M25562 Pain in left knee: Secondary | ICD-10-CM

## 2021-07-28 ENCOUNTER — Encounter: Payer: Self-pay | Admitting: Gastroenterology

## 2021-07-28 ENCOUNTER — Other Ambulatory Visit: Payer: Self-pay | Admitting: Podiatry

## 2021-07-28 ENCOUNTER — Telehealth: Payer: Self-pay | Admitting: Nurse Practitioner

## 2021-07-28 NOTE — Telephone Encounter (Signed)
Patient called stating she received a phone call but was unsure who it was from, and as I didn't see any notes, I figured I'd forward it to you to see if you knew anything about it.  Thank you.

## 2021-07-28 NOTE — Telephone Encounter (Signed)
Left detailed message for pt letting her know I do not see any documentation of a call from our office today.

## 2021-08-04 ENCOUNTER — Ambulatory Visit (AMBULATORY_SURGERY_CENTER): Payer: Medicare Other | Admitting: Gastroenterology

## 2021-08-04 ENCOUNTER — Encounter: Payer: Self-pay | Admitting: Gastroenterology

## 2021-08-04 VITALS — BP 107/63 | HR 79 | Temp 97.5°F | Resp 12 | Ht 61.25 in | Wt 167.0 lb

## 2021-08-04 DIAGNOSIS — Z860100 Personal history of colon polyps, unspecified: Secondary | ICD-10-CM

## 2021-08-04 DIAGNOSIS — D122 Benign neoplasm of ascending colon: Secondary | ICD-10-CM | POA: Diagnosis not present

## 2021-08-04 DIAGNOSIS — K5730 Diverticulosis of large intestine without perforation or abscess without bleeding: Secondary | ICD-10-CM | POA: Diagnosis not present

## 2021-08-04 DIAGNOSIS — R194 Change in bowel habit: Secondary | ICD-10-CM | POA: Diagnosis not present

## 2021-08-04 DIAGNOSIS — K641 Second degree hemorrhoids: Secondary | ICD-10-CM | POA: Diagnosis not present

## 2021-08-04 DIAGNOSIS — R159 Full incontinence of feces: Secondary | ICD-10-CM

## 2021-08-04 DIAGNOSIS — K5520 Angiodysplasia of colon without hemorrhage: Secondary | ICD-10-CM | POA: Diagnosis not present

## 2021-08-04 MED ORDER — SODIUM CHLORIDE 0.9 % IV SOLN: 500.0000 mL | Freq: Once | INTRAVENOUS | Status: DC

## 2021-08-04 NOTE — Patient Instructions (Signed)
Await pathology results.  YOU HAD AN ENDOSCOPIC PROCEDURE TODAY AT [facility]:   Refer to the procedure report that was given to you for any specific questions about what was found during the examination.  If the procedure report does not answer your questions, please call your gastroenterologist to clarify.  If you requested that your care partner not be given the details of your procedure findings, then the procedure report has been included in a sealed envelope for you to review at your convenience later.  YOU SHOULD EXPECT: Some feelings of bloating in the abdomen. Passage of more gas than usual.  Walking can help get rid of the air that was put into your GI tract during the procedure and reduce the bloating. If you had a lower endoscopy (such as a colonoscopy or flexible sigmoidoscopy) you may notice spotting of blood in your stool or on the toilet paper. If you underwent a bowel prep for your procedure, you may not have a normal bowel movement for a few days.  Please Note:  You might notice some irritation and congestion in your nose or some drainage.  This is from the oxygen used during your procedure.  There is no need for concern and it should clear up in a day or so.  SYMPTOMS TO REPORT IMMEDIATELY:  Following lower endoscopy (colonoscopy or flexible sigmoidoscopy):  Excessive amounts of blood in the stool  Significant tenderness or worsening of abdominal pains  Swelling of the abdomen that is new, acute  Fever of 100F or higher  For urgent or emergent issues, a gastroenterologist can be reached at any hour by calling [phone]. Do not use MyChart messaging for urgent concerns.    DIET:  We do recommend a small meal at first, but then you may proceed to your regular diet.  Drink plenty of fluids but you should avoid alcoholic beverages for 24 hours.  ACTIVITY:  You should plan to take it easy for the rest of today and you should NOT DRIVE or use heavy machinery  until tomorrow (because of the sedation medicines used during the test).    FOLLOW UP: Our staff will call the number listed on your records the next business day following your procedure.  We will call around 7:15- 8:00 am to check on you and address any questions or concerns that you may have regarding the information given to you following your procedure. If we do not reach you, we will leave a message.  If you develop any symptoms (ie: fever, flu-like symptoms, shortness of breath, cough etc.) before then, please call [phone].  If you test positive for Covid 19 in the 2 weeks post procedure, please call and report this information to [place].    If any biopsies were taken you will be contacted by phone or by letter within the next 1-3 weeks.  Please call us at [phone] if you have not heard about the biopsies in 3 weeks.    SIGNATURES/CONFIDENTIALITY: You and/or your care partner have signed paperwork which will be entered into your electronic medical record.  These signatures attest to the fact that that the information above on your After Visit Summary has been reviewed and is understood.  Full responsibility of the confidentiality of this discharge information lies with you and/or your care-partner.

## 2021-08-04 NOTE — Op Note (Signed)
[facility] Patient Name: Renee Bauer Procedure Date: 08/04/2021 1:45 PM MRN: [id number] Endoscopist: Gerrit Heck , MD Age: 71 Referring MD:  Date of Birth: [birth year] Gender: Female Account #: [id number] Procedure:                Colonoscopy with snare polypectomy and biopsy Indications:              Change in bowel habits, Fecal incontinence/Fecal                            leakage                           History of colon polyps. Last colonsocopy was 2018                            and notable for 7 mm adenoma. Medicines:                Monitored Anesthesia Care Procedure:                Pre-Anesthesia Assessment:                           - Prior to the procedure, a History and Physical                            was performed, and patient medications and                            allergies were reviewed. The patient's tolerance of                            previous anesthesia was also reviewed. The risks                            and benefits of the procedure and the sedation                            options and risks were discussed with the patient.                            All questions were answered, and informed consent                            was obtained. Prior Anticoagulants: The patient has                            taken no previous anticoagulant or antiplatelet                            agents. ASA Grade Assessment: II - A patient with                            mild systemic disease. After reviewing the risks  and benefits, the patient was deemed in                            satisfactory condition to undergo the procedure.                           After obtaining informed consent, the colonoscope                            was passed under direct vision. Throughout the                            procedure, the patient's blood pressure, pulse, and                            oxygen saturations were monitored  continuously. The                            Olympus CF-HQ190L [id number]) Colonoscope was                            introduced through the anus and advanced to the the                            terminal ileum. The colonoscopy was performed                            without difficulty. The patient tolerated the                            procedure well. The quality of the bowel                            preparation was good. The terminal ileum, ileocecal                            valve, appendiceal orifice, and rectum were                            photographed. Scope In: 1:58:36 PM Scope Out: 2:16:59 PM Scope Withdrawal Time: 0 hours 14 minutes 22 seconds  Total Procedure Duration: 0 hours 18 minutes 23 seconds  Findings:                 Hemorrhoids were found on perianal exam.                           A single small, non-bleeding angioectasia with                            typical arborization was found in the cecum.                           Five sessile polyps were found in the ascending  colon. The polyps were 3 to 8 mm in size. These                            polyps were removed with a cold snare. Resection                            and retrieval were complete. Estimated blood loss                            was minimal.                           A few small-mouthed diverticula were found in the                            ascending colon.                           There was evidence of a prior end-to-end                            colo-colonic anastomosis in the recto-sigmoid                            colon, located approximately 21 cm from the anal                            verge. This was patent and was characterized by                            healthy appearing mucosa. The anastomosis was                            traversed.                           Normal mucosa was found in the entire colon.                            Biopsies for  histology were taken with a cold                            forceps from the right colon and left colon for                            evaluation of microscopic colitis. Estimated blood                            loss was minimal.                           Non-bleeding internal hemorrhoids were found during                            retroflexion. The hemorrhoids were  medium-sized and                            Grade II (internal hemorrhoids that prolapse but                            reduce spontaneously). There was a well-healed scar                            in the distal rectum consistent with previosu                            surgery (prolapse surgery vs prior banding?).                           The terminal ileum appeared normal. Complications:            No immediate complications. Estimated Blood Loss:     Estimated blood loss was minimal. Impression:               - Hemorrhoids found on perianal exam.                           - A single colonic angioectasia.                           - Five 3 to 8 mm polyps in the ascending colon,                            removed with a cold snare. Resected and retrieved.                           - Diverticulosis in the ascending colon.                           - Patent end-to-end colo-colonic anastomosis,                            characterized by healthy appearing mucosa.                           - Normal mucosa in the entire examined colon.                            Biopsied.                           - Non-bleeding internal hemorrhoids.                           - The examined portion of the ileum was normal. Recommendation:           - Patient has a contact number available for                            emergencies. The signs and symptoms of potential  delayed complications were discussed with the                            patient. Return to normal activities tomorrow.                             Written discharge instructions were provided to the                            patient.                           - Resume previous diet.                           - Continue present medications.                           - Await pathology results.                           - Repeat colonoscopy for surveillance based on                            pathology results.                           - Use fiber, for example Citrucel, Fibercon, Konsyl                            or Metamucil.                           - Internal hemorrhoids were noted on this study and                            may be amenable to hemorrhoid band ligation. If you                            are interested in further treatment of these                            hemorrhoids with band ligation, please contact my                            clinic to set up an appointment for evaluation and                            treatment. Gerrit Heck, MD 08/04/2021 2:28:55 PM

## 2021-08-04 NOTE — Progress Notes (Signed)
Report to PACU, RN, vss, BBS= Clear.  

## 2021-08-04 NOTE — Progress Notes (Signed)
GASTROENTEROLOGY PROCEDURE H&P NOTE   Primary Care Physician: No primary care provider on file.    Reason for Procedure:  Fecal leakage, history of colon polyps  Plan:    Colonoscopy  Patient is appropriate for endoscopic procedure(s) in the ambulatory ([place]) setting.  The nature of the procedure, as well as the risks, benefits, and alternatives were carefully and thoroughly reviewed with the patient. Ample time for discussion and questions allowed. The patient understood, was satisfied, and agreed to proceed.     HPI: Renee Bauer is a 71 y.o. female who presents for colonoscopy for evaluation of fecal leakage.  No improvement with trial of hemorrhoidal treatment, glycerin suppository, etc.  Last colonoscopy was 2018 in [place], [place] and notable for 7 mm adenomatous polyp and sigmoid diverticulosis.  Past Medical History:  Diagnosis Date   Anxiety    Arthritis    Benign colon polyp 2018   Bursitis    Depression    Diabetes mellitus without complication (HCC)    GERD (gastroesophageal reflux disease)    Hyperlipidemia    Rosacea     Past Surgical History:  Procedure Laterality Date   ABDOMINAL HYSTERECTOMY     menorrhagia   BLADDER REPAIR     BREAST BIOPSY Left    BUNIONECTOMY Right    COLON RESECTION  2007   COLONOSCOPY     DILATION AND CURETTAGE OF UTERUS     TOTAL KNEE ARTHROPLASTY Left 09/29/2020    Prior to Admission medications   Medication Sig Start Date End Date Taking? Authorizing Provider  CALCIUM CITRATE PO Take 600 mg by mouth daily.   Yes [provider]  Cholecalciferol (VITAMIN D3 PO) Take 2,000 Units by mouth daily.   Yes [provider]  esomeprazole (NEXIUM) 40 MG capsule Take 1 capsule by mouth once daily 06/28/21  Yes Koberlein, Junell C, MD  fluticasone (FLONASE) 50 MCG/ACT nasal spray Place 2 sprays into both nostrils daily. 02/02/21  Yes Koberlein, Junell C, MD  fluvoxaMINE (LUVOX) 50 MG tablet Take 1 tablet (50  mg total) by mouth at bedtime. 03/30/21  Yes Koberlein, Steele Berg, MD  gabapentin (NEURONTIN) 100 MG capsule TAKE 1 CAPSULE THREE TIMES A DAY 07/28/21  Yes Felipa Furnace, DPM  rosuvastatin (CRESTOR) 10 MG tablet TAKE 1 TABLET AT BEDTIME 01/25/21  Yes Koberlein, Junell C, MD  vitamin B-12 (CYANOCOBALAMIN) 1000 MCG tablet Take 1,000 mcg by mouth daily.   Yes [provider]  zolpidem (AMBIEN CR) 6.25 MG CR tablet Take 1 tablet (6.25 mg total) by mouth at bedtime as needed for sleep. 03/24/20  Yes Koberlein, Steele Berg, MD  ALPRAZolam (XANAX) 1 MG tablet TAKE 1 TABLET AS NEEDED FOR ANXIETY 05/20/21   Koberlein, Andris Flurry C, MD  lidocaine-prilocaine (EMLA) cream APPLY 1 APPLICATION TOPICALLY AS NEEDED Patient not taking: Reported on 08/04/2021 05/04/21   Caren Macadam, MD  meloxicam (MOBIC) 7.5 MG tablet Take 1 tablet (7.5 mg total) by mouth 2 (two) times daily as needed for pain. 03/21/21   Koberlein, Steele Berg, MD  METRONIDAZOLE, TOPICAL, 0.75 % LOTN as needed.  06/30/18   [provider]  montelukast (SINGULAIR) 10 MG tablet Take 1 tablet (10 mg total) by mouth at bedtime. Patient taking differently: Take 10 mg by mouth as needed. 11/17/20   Caren Macadam, MD  OVER THE COUNTER MEDICATION Zeasorb powder    [provider]  TERBINAFINE EX Apply topically.    [provider]  Current Outpatient Medications  Medication Sig Dispense Refill   CALCIUM CITRATE PO Take 600 mg by mouth daily.     Cholecalciferol (VITAMIN D3 PO) Take 2,000 Units by mouth daily.     esomeprazole (NEXIUM) 40 MG capsule Take 1 capsule by mouth once daily 90 capsule 0   fluticasone (FLONASE) 50 MCG/ACT nasal spray Place 2 sprays into both nostrils daily. 48 g 3   fluvoxaMINE (LUVOX) 50 MG tablet Take 1 tablet (50 mg total) by mouth at bedtime. 90 tablet 3   gabapentin (NEURONTIN) 100 MG capsule TAKE 1 CAPSULE THREE TIMES A DAY 90 capsule 11   rosuvastatin (CRESTOR) 10 MG tablet TAKE 1 TABLET  AT BEDTIME 90 tablet 1   vitamin B-12 (CYANOCOBALAMIN) 1000 MCG tablet Take 1,000 mcg by mouth daily.     zolpidem (AMBIEN CR) 6.25 MG CR tablet Take 1 tablet (6.25 mg total) by mouth at bedtime as needed for sleep. 90 tablet 3   ALPRAZolam (XANAX) 1 MG tablet TAKE 1 TABLET AS NEEDED FOR ANXIETY 30 tablet 2   lidocaine-prilocaine (EMLA) cream APPLY 1 APPLICATION TOPICALLY AS NEEDED (Patient not taking: Reported on 08/04/2021) 30 g 11   meloxicam (MOBIC) 7.5 MG tablet Take 1 tablet (7.5 mg total) by mouth 2 (two) times daily as needed for pain. 180 tablet 0   METRONIDAZOLE, TOPICAL, 0.75 % LOTN as needed.      montelukast (SINGULAIR) 10 MG tablet Take 1 tablet (10 mg total) by mouth at bedtime. (Patient taking differently: Take 10 mg by mouth as needed.) 90 tablet 3   OVER THE COUNTER MEDICATION Zeasorb powder     TERBINAFINE EX Apply topically.     Current Facility-Administered Medications  Medication Dose Route Frequency Provider Last Rate Last Admin   0.9 %  sodium chloride infusion  500 mL Intravenous Once Tamaira Ciriello V, DO        Allergies as of 08/04/2021 - Review Complete 08/04/2021  Allergen Reaction Noted   Tramadol  11/17/2020   Wellbutrin [bupropion]  06/11/2019   Clindamycin hcl Rash 10/08/2018    Family History  Problem Relation Age of Onset   Diabetes Mother    Arrhythmia Mother    High blood pressure Mother    Mesothelioma Father        asbestos exposure   Allergic rhinitis Father    Sinusitis Father    Healthy Sister    Post-traumatic stress disorder Son    Scoliosis Son    Brain cancer Maternal Aunt    Asthma Neg Hx    Eczema Neg Hx    Urticaria Neg Hx    Angioedema Neg Hx    Immunodeficiency Neg Hx    Atopy Neg Hx    Colon cancer Neg Hx    Esophageal cancer Neg Hx    Stomach cancer Neg Hx    Colon polyps Neg Hx     Social History   Socioeconomic History   Marital status: Married    Spouse name: Not on file   Number of children: 2   Years of  education: Not on file   Highest education level: Not on file  Occupational History   Occupation: Retired  Tobacco Use   Smoking status: Never   Smokeless tobacco: Never  Vaping Use   Vaping Use: Never used  Substance and Sexual Activity   Alcohol use: Yes    Comment: occasional wine   Drug use: Never   Sexual activity: Not on file  Other  Topics Concern   Not on file  Social History Narrative   Not on file   Social Determinants of Health   Financial Resource Strain: Low Risk  (06/06/2021)   Overall Financial Resource Strain (CARDIA)    Difficulty of Paying Living Expenses: Not hard at all  Food Insecurity: No Food Insecurity (06/06/2021)   Hunger Vital Sign    Worried About Running Out of Food in the Last Year: Never true    Ran Out of Food in the Last Year: Never true  Transportation Needs: No Transportation Needs (06/06/2021)   PRAPARE - Hydrologist (Medical): No    Lack of Transportation (Non-Medical): No  Physical Activity: Sufficiently Active (06/06/2021)   Exercise Vital Sign    Days of Exercise per Week: 5 days    Minutes of Exercise per Session: 50 min  Stress: No Stress Concern Present (06/06/2021)   [place]    Feeling of Stress : Only a little  Social Connections: Socially Integrated (06/06/2021)   Social Connection and Isolation Panel [NHANES]    Frequency of Communication with Friends and Family: Twice a week    Frequency of Social Gatherings with Friends and Family: Once a week    Attends Religious Services: More than 4 times per year    Active Member of [employer] or Organizations: Yes    Attends Music therapist: More than 4 times per year    Marital Status: Married  Human resources officer Violence: Not At Risk (06/06/2021)   Humiliation, Afraid, Rape, and Kick questionnaire    Fear of Current or Ex-Partner: No    Emotionally Abused: No    Physically Abused: No     Sexually Abused: No    Physical Exam: Vital signs in last 24 hours: '@BP'$  120/80   Pulse 92   Temp (!) 97.5 F (36.4 C) (Temporal)   Ht 5' 1.25" (1.556 m)   Wt 167 lb (75.8 kg)   SpO2 95%   BMI 31.30 kg/m  GEN: NAD EYE: Sclerae anicteric ENT: MMM CV: Non-tachycardic Pulm: CTA b/l GI: Soft, NT/ND NEURO:  Alert & Oriented x Morris, DO [facility]   08/04/2021 1:50 PM

## 2021-08-04 NOTE — Progress Notes (Signed)
Called to room to assist during endoscopic procedure.  Patient ID and intended procedure confirmed with present staff. Received instructions for my participation in the procedure from the performing physician.  

## 2021-08-05 ENCOUNTER — Telehealth: Payer: Self-pay | Admitting: *Deleted

## 2021-08-05 NOTE — Telephone Encounter (Signed)
Post procedure follow up call placed, no answer and left VM.  

## 2021-08-08 ENCOUNTER — Other Ambulatory Visit: Payer: Self-pay | Admitting: *Deleted

## 2021-08-08 DIAGNOSIS — E1169 Type 2 diabetes mellitus with other specified complication: Secondary | ICD-10-CM

## 2021-08-08 MED ORDER — ROSUVASTATIN CALCIUM 10 MG PO TABS: 10.0000 mg | ORAL_TABLET | Freq: Every day | ORAL | 1 refills | Status: DC

## 2021-08-10 ENCOUNTER — Ambulatory Visit (HOSPITAL_COMMUNITY)
Admission: RE | Admit: 2021-08-10 | Discharge: 2021-08-10 | Disposition: A | Discharge status: Home / Self Care | Payer: Medicare Other | Source: Ambulatory Visit | Attending: Family Medicine | Admitting: Family Medicine

## 2021-08-10 ENCOUNTER — Ambulatory Visit: Payer: Medicare Other | Admitting: Rheumatology

## 2021-08-10 DIAGNOSIS — G8929 Other chronic pain: Secondary | ICD-10-CM | POA: Diagnosis present

## 2021-08-10 DIAGNOSIS — M25562 Pain in left knee: Secondary | ICD-10-CM | POA: Diagnosis present

## 2021-08-10 MED ORDER — TECHNETIUM TC 99M MEDRONATE IV KIT
20.0000 | PACK | Freq: Once | INTRAVENOUS | Status: AC | PRN
  Administered 2021-08-10: 20 via INTRAVENOUS

## 2021-08-11 ENCOUNTER — Encounter: Payer: Self-pay | Admitting: Gastroenterology

## 2021-08-16 NOTE — Progress Notes (Unsigned)
Renee Bauer Phone: [phone] Subjective:   Renee Bauer, am serving as a scribe for Dr. Hulan Saas.  I'm seeing this patient by the request  of:  Patient, Bauer Pcp Per  CC: Knee pain follow-up  [id number]  07/06/2021 Patient does have some calcific changes noted at this point.  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  We will see how patient responds to shockwave therapy.  Discussed vitamin D supplementation, will get laboratory work-up to make sure patient does not toxemia.  On ultrasound today patient did have small effusion of the placement so we will check on him and cobalt as well as we will get an x-ray to further evaluate but likely is stable on exam today follow-up with me again in 4 weeks  Updated 08/17/2021 Renee Bauer is a 71 y.o. female coming in with complaint of L knee pain. Has been seeing Renee Bauer for Shockwave therapy. Does note feel like therapy helped. Constant tightness and pain worse with stair climbing. Using meloxicam.  Patient states does not know if he is making any significant improvement.  Still feels like she is having limited range of motion.  Patient did follow-up with orthopedic surgeon but did not get anything different with treatment at the moment.      Past Medical History:  Diagnosis Date   Anxiety    Arthritis    Benign colon polyp 2018   Bursitis    Depression    Diabetes mellitus without complication (HCC)    GERD (gastroesophageal reflux disease)    Hyperlipidemia    Rosacea    Past Surgical History:  Procedure Laterality Date   ABDOMINAL HYSTERECTOMY     menorrhagia   BLADDER REPAIR     BREAST BIOPSY Left    BUNIONECTOMY Right    COLON RESECTION  2007   COLONOSCOPY     DILATION AND CURETTAGE OF UTERUS     TOTAL KNEE ARTHROPLASTY Left 09/29/2020   Social History   Socioeconomic History    Marital status: Married    Spouse name: Not on file   Number of children: 2   Years of education: Not on file   Highest education level: Not on file  Occupational History   Occupation: Retired  Tobacco Use   Smoking status: Never   Smokeless tobacco: Never  Vaping Use   Vaping Use: Never used  Substance and Sexual Activity   Alcohol use: Yes    Comment: occasional wine   Drug use: Never   Sexual activity: Not on file  Other Topics Concern   Not on file  Social History Narrative   Not on file   Social Determinants of Health   Financial Resource Strain: Low Risk  (06/06/2021)   Overall Financial Resource Strain (CARDIA)    Difficulty of Paying Living Expenses: Not hard at all  Food Insecurity: Bauer Food Insecurity (06/06/2021)   Hunger Vital Sign    Worried About Running Out of Food in the Last Year: Never true    Ran Out of Food in the Last Year: Never true  Transportation Needs: Bauer Transportation Needs (06/06/2021)   PRAPARE - Hydrologist (Medical): Bauer    Lack of Transportation (Non-Medical): Bauer  Physical Activity: Sufficiently Active (06/06/2021)   Exercise Vital Sign    Days of Exercise per Week: 5 days    Minutes of  Exercise per Session: 50 min  Stress: Bauer Stress Concern Present (06/06/2021)   [place]    Feeling of Stress : Only a little  Social Connections: Socially Integrated (06/06/2021)   Social Connection and Isolation Panel [NHANES]    Frequency of Communication with Friends and Family: Twice a week    Frequency of Social Gatherings with Friends and Family: Once a week    Attends Religious Services: More than 4 times per year    Active Member of [employer] or Organizations: Yes    Attends Music therapist: More than 4 times per year    Marital Status: Married   Allergies  Allergen Reactions   Tramadol     Felt sick with this   Wellbutrin [Bupropion]     Muscle  twitches   Clindamycin Hcl Rash   Family History  Problem Relation Age of Onset   Diabetes Mother    Arrhythmia Mother    High blood pressure Mother    Mesothelioma Father        asbestos exposure   Allergic rhinitis Father    Sinusitis Father    Healthy Sister    Post-traumatic stress disorder Son    Scoliosis Son    Brain cancer Maternal Aunt    Asthma Neg Hx    Eczema Neg Hx    Urticaria Neg Hx    Angioedema Neg Hx    Immunodeficiency Neg Hx    Atopy Neg Hx    Colon cancer Neg Hx    Esophageal cancer Neg Hx    Stomach cancer Neg Hx    Colon polyps Neg Hx      Current Outpatient Medications (Cardiovascular):    rosuvastatin (CRESTOR) 10 MG tablet, Take 1 tablet (10 mg total) by mouth at bedtime.  Current Outpatient Medications (Respiratory):    fluticasone (FLONASE) 50 MCG/ACT nasal spray, Place 2 sprays into both nostrils daily.   montelukast (SINGULAIR) 10 MG tablet, Take 1 tablet (10 mg total) by mouth at bedtime. (Patient taking differently: Take 10 mg by mouth as needed.)  Current Outpatient Medications (Analgesics):    meloxicam (MOBIC) 7.5 MG tablet, Take 1 tablet (7.5 mg total) by mouth 2 (two) times daily as needed for pain.  Current Outpatient Medications (Hematological):    vitamin B-12 (CYANOCOBALAMIN) 1000 MCG tablet, Take 1,000 mcg by mouth daily.  Current Outpatient Medications (Other):    ALPRAZolam (XANAX) 1 MG tablet, TAKE 1 TABLET AS NEEDED FOR ANXIETY   CALCIUM CITRATE PO, Take 600 mg by mouth daily.   Cholecalciferol (VITAMIN D3 PO), Take 2,000 Units by mouth daily.   esomeprazole (NEXIUM) 40 MG capsule, Take 1 capsule by mouth once daily   fluvoxaMINE (LUVOX) 50 MG tablet, Take 1 tablet (50 mg total) by mouth at bedtime.   gabapentin (NEURONTIN) 100 MG capsule, TAKE 1 CAPSULE THREE TIMES A DAY   lidocaine-prilocaine (EMLA) cream, APPLY 1 APPLICATION TOPICALLY AS NEEDED   METRONIDAZOLE, TOPICAL, 0.75 % LOTN, as needed.    OVER THE COUNTER  MEDICATION, Zeasorb powder   TERBINAFINE EX, Apply topically.   zolpidem (AMBIEN CR) 6.25 MG CR tablet, Take 1 tablet (6.25 mg total) by mouth at bedtime as needed for sleep.   Reviewed prior external information including notes and imaging from  primary care provider As well as notes that were available from care everywhere and other healthcare systems.  Past medical history, social, surgical and family history all reviewed in electronic  medical record.  Bauer pertanent information unless stated regarding to the chief complaint.   Review of Systems:  Bauer headache, visual changes, nausea, vomiting, diarrhea, constipation, dizziness, abdominal pain, skin rash, fevers, chills, night sweats, weight loss, swollen lymph nodes, body aches, joint swelling, chest pain, shortness of breath, mood changes. POSITIVE muscle aches  Objective  Blood pressure 114/82, pulse 90, height '5\' 1"'$  (1.549 m), weight 170 lb (77.1 kg), SpO2 99 %.   General: Bauer apparent distress alert and oriented x3 mood and affect normal, dressed appropriately.  HEENT: Pupils equal, extraocular movements intact  Respiratory: Patient's speak in full sentences and does not appear short of breath  Cardiovascular: Bauer lower extremity edema, non tender, Bauer erythema  Patient's left knee is a replacement.  Still trace effusion noted otherwise.  Questionable less tenderness than previous exam. More pain over the medial joint line.  Some of the tibia as well still noted.  Limited muscular skeletal ultrasound was performed and interpreted by Hulan Saas, M  Ultrasound does show that there is some decrease in the calcific changes noted of the tendon.  Patient still has some very mild hypoechoic changes that seems to be a small effusion coming from the medial joint line. Impression: Improvement in some calcific changes but still trace effusion of the joint replacement      Impression and Recommendations:     The above documentation has  been reviewed and is accurate and complete Lyndal Pulley, DO

## 2021-08-17 ENCOUNTER — Other Ambulatory Visit: Payer: Self-pay | Admitting: Family Medicine

## 2021-08-17 ENCOUNTER — Ambulatory Visit: Payer: Self-pay

## 2021-08-17 ENCOUNTER — Encounter: Payer: Self-pay | Admitting: Family Medicine

## 2021-08-17 ENCOUNTER — Ambulatory Visit (INDEPENDENT_AMBULATORY_CARE_PROVIDER_SITE_OTHER): Payer: Medicare Other | Admitting: Family Medicine

## 2021-08-17 VITALS — BP 114/82 | HR 90 | Ht 61.0 in | Wt 170.0 lb

## 2021-08-17 DIAGNOSIS — M2550 Pain in unspecified joint: Secondary | ICD-10-CM

## 2021-08-17 DIAGNOSIS — Z96652 Presence of left artificial knee joint: Secondary | ICD-10-CM

## 2021-08-17 DIAGNOSIS — M25562 Pain in left knee: Secondary | ICD-10-CM

## 2021-08-17 DIAGNOSIS — G8929 Other chronic pain: Secondary | ICD-10-CM | POA: Diagnosis not present

## 2021-08-17 MED ORDER — MELOXICAM 7.5 MG PO TABS: 7.5000 mg | ORAL_TABLET | Freq: Two times a day (BID) | ORAL | 0 refills | Status: DC | PRN

## 2021-08-17 NOTE — Patient Instructions (Signed)
[place] If you get worse call me See me in 2-3 months

## 2021-08-17 NOTE — Telephone Encounter (Signed)
Pt requesting refill  meloxicam (MOBIC) 7.5 MG tablet  was a patient of koberlein, last seen 05/18/2021

## 2021-08-17 NOTE — Assessment & Plan Note (Signed)
Continues to have the left knee pain.  The calcific changes that was noted somewhat on the previous ultrasound does seem to be resolving.  We discussed with patient know if continuing to have pain around the is a specialized MRI but I would consider a second opinion by orthopedic surgery.  At the moment though after discussing for quite some time we did discuss maybe repeating formal physical therapy will be helpful and patient will be referred.  Patient is in agreement with this plan and will follow-up with me again in 2 months to see how she is responding.  Total time 33 minutes

## 2021-08-18 ENCOUNTER — Encounter: Payer: Self-pay | Admitting: Family Medicine

## 2021-08-18 ENCOUNTER — Ambulatory Visit: Payer: Medicare Other | Admitting: Rheumatology

## 2021-08-18 ENCOUNTER — Other Ambulatory Visit: Payer: Self-pay

## 2021-08-18 DIAGNOSIS — M170 Bilateral primary osteoarthritis of knee: Secondary | ICD-10-CM

## 2021-08-25 ENCOUNTER — Encounter: Payer: Self-pay | Admitting: Family Medicine

## 2021-08-31 ENCOUNTER — Ambulatory Visit (INDEPENDENT_AMBULATORY_CARE_PROVIDER_SITE_OTHER): Payer: Medicare Other | Admitting: Podiatry

## 2021-08-31 DIAGNOSIS — M79675 Pain in left toe(s): Secondary | ICD-10-CM

## 2021-08-31 DIAGNOSIS — M79674 Pain in right toe(s): Secondary | ICD-10-CM | POA: Diagnosis not present

## 2021-08-31 DIAGNOSIS — B351 Tinea unguium: Secondary | ICD-10-CM | POA: Diagnosis not present

## 2021-08-31 DIAGNOSIS — E1142 Type 2 diabetes mellitus with diabetic polyneuropathy: Secondary | ICD-10-CM | POA: Diagnosis not present

## 2021-08-31 NOTE — Progress Notes (Signed)
  Subjective:  Patient ID: Renee Bauer, female    DOB: [birth year],  MRN: [id number]  Chief Complaint  Patient presents with   Foot Problem     3 month rfc pain in toe nails, Pain located in right foot, tingling in bilateral feet, onchomychosis in bilateral toes, pain worse at night    71 y.o. female returns for the above complaint.  Patient presents with thickened elongated dystrophic toenails x10.  Pain full to touch.  She is a diabetic with last A1c of 5.7.  She would like to have her nails debrided down.  She denies any other acute complaints.  Objective:  There were no vitals filed for this visit. Podiatric Exam: Vascular: dorsalis pedis and posterior tibial pulses are palpable bilateral. Capillary return is immediate. Temperature gradient is WNL. Skin turgor WNL  Sensorium: Normal Semmes Weinstein monofilament test. Normal tactile sensation bilaterally. Nail Exam: Pt has thick disfigured discolored nails with subungual debris noted bilateral entire nail hallux through fifth toenails.  Except left second digit due to previous total nail avulsion.  Pain on palpation to the nails. Ulcer Exam: There is no evidence of ulcer or pre-ulcerative changes or infection. Orthopedic Exam: Muscle tone and strength are WNL. No limitations in general ROM. No crepitus or effusions noted. HAV  B/L.  Hammer toes 2-5  B/L. Skin: No Porokeratosis. No infection or ulcers    Assessment & Plan:   No diagnosis found.     Patient was evaluated and treated and all questions answered.  Onychomycosis with pain  -Nails palliatively debrided as below. -Educated on self-care  Procedure: Nail Debridement Rationale: pain  Type of Debridement: manual, sharp debridement. Instrumentation: Nail nipper, rotary burr. Number of Nails: 9  Procedures and Treatment: Consent by patient was obtained for treatment procedures. The patient understood the discussion of treatment and procedures well. All questions were  answered thoroughly reviewed. Debridement of mycotic and hypertrophic toenails, 1 through 5 bilateral and clearing of subungual debris. No ulceration, no infection noted.  Return Visit-Office Procedure: Patient instructed to return to the office for a follow up visit 3 months for continued evaluation and treatment.  Boneta Lucks, DPM    No follow-ups on file.

## 2021-09-09 ENCOUNTER — Ambulatory Visit (INDEPENDENT_AMBULATORY_CARE_PROVIDER_SITE_OTHER): Payer: Medicare Other | Admitting: Family Medicine

## 2021-09-09 ENCOUNTER — Encounter: Payer: Self-pay | Admitting: Family Medicine

## 2021-09-09 VITALS — BP 102/60 | HR 72 | Temp 97.7°F | Ht 61.0 in | Wt 161.0 lb

## 2021-09-09 DIAGNOSIS — M2550 Pain in unspecified joint: Secondary | ICD-10-CM | POA: Diagnosis not present

## 2021-09-09 DIAGNOSIS — F419 Anxiety disorder, unspecified: Secondary | ICD-10-CM

## 2021-09-09 DIAGNOSIS — E1142 Type 2 diabetes mellitus with diabetic polyneuropathy: Secondary | ICD-10-CM | POA: Diagnosis not present

## 2021-09-09 DIAGNOSIS — Z1231 Encounter for screening mammogram for malignant neoplasm of breast: Secondary | ICD-10-CM | POA: Diagnosis not present

## 2021-09-09 DIAGNOSIS — K219 Gastro-esophageal reflux disease without esophagitis: Secondary | ICD-10-CM

## 2021-09-09 LAB — POCT GLYCOSYLATED HEMOGLOBIN (HGB A1C): Hemoglobin A1C: 5.7 % — AB (ref 4.0–5.6)

## 2021-09-09 MED ORDER — ESOMEPRAZOLE MAGNESIUM 40 MG PO CPDR
40.0000 mg | DELAYED_RELEASE_CAPSULE | Freq: Every day | ORAL | 1 refills | Status: DC
Start: 2021-09-09 — End: 2022-03-08

## 2021-09-09 MED ORDER — MELOXICAM 7.5 MG PO TABS: 7.5000 mg | ORAL_TABLET | Freq: Two times a day (BID) | ORAL | 1 refills | Status: DC | PRN

## 2021-09-09 NOTE — Progress Notes (Unsigned)
   Established Patient Office Visit  Subjective   Patient ID: Renee Bauer, female    DOB: November 07, 1950  Age: 72 y.o. MRN: [id number]  Chief Complaint  Patient presents with   Establish Care    Patient reports that she is only taking the xanax or ambien once or twice a week, states when she goes on vacation (is going on vacation next week) she will switch off and take either a xanax or an ambien to help her sleep.   Anxiety-- pt reports that she has been on multiple antidepressants in the past, states that she would get severe dry mouth with most medications, has tried zoloft, prozac, fluvoxamine. States that her anxiety was worse when she moved here a few years ago, states that her mother passed away due to [place], did go to a therapist which helped a lot. Continues with physical therpy for her knee pain. Continues to follow with her orthopedist as well.  PreDM-- pt is not on any medication for this, is mostly diet controlled. States she goes to the podiatrist every 10 weeks and she gets regular foot exams there, last visit was 1-2 weeks ago. 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    {History (Optional):23778}  ROS    Objective:     BP 102/60 (BP Location: Left Arm, Patient Position: Sitting, Cuff Size: Normal)   Pulse 72   Temp 97.7 F (36.5 C) (Oral)   Ht '5\' 1"'$  (1.549 m)   Wt 161 lb (73 kg)   SpO2 99%   BMI 30.42 kg/m  {Vitals History (Optional):23777}  Physical Exam   No results found for any visits on 09/09/21.  {Labs (Optional):23779}  The 10-year ASCVD risk score (Arnett DK, et al., 2019) is: 8.9%    Assessment & Plan:   Problem List Items Addressed This Visit       Endocrine   DM (diabetes mellitus) type II controlled, neurological manifestation ([place]) - Primary   Relevant Orders   POC HgB A1c    No follow-ups on file.    Farrel Conners, MD

## 2021-09-09 NOTE — Patient Instructions (Signed)
Check with the pharmacy ask if you have received the Prevnar 20 and the second shingles vaccine.

## 2021-09-11 DIAGNOSIS — K219 Gastro-esophageal reflux disease without esophagitis: Secondary | ICD-10-CM | POA: Insufficient documentation

## 2021-09-11 NOTE — Assessment & Plan Note (Signed)
I discussed this problem at length with the patient and we discussed the high risk of having both xanax and ambien at home, that benzodiazepines can increase the risk of falls and dementia, especially if used in conjunction with other controlled substances (patient is also taking gabapentin for her foot neuropathy). I advised that she should not take the ambien and the xanax within 24 hours of each other, and to only use them sparingly.

## 2021-09-11 NOTE — Assessment & Plan Note (Addendum)
A1C is very well controlled, she is currently not requiring medication for this condition, it is managed with dietary control. Will continue to monitor her A1C every 6 months.

## 2021-09-11 NOTE — Assessment & Plan Note (Signed)
Reviewed her sx with patient, she reports she is well controlled on the nexium 40 mg daily, will continue this medication. Refills sent.

## 2021-09-20 ENCOUNTER — Encounter: Payer: Self-pay | Admitting: Family Medicine

## 2021-09-20 DIAGNOSIS — M2550 Pain in unspecified joint: Secondary | ICD-10-CM

## 2021-09-20 MED ORDER — MELOXICAM 7.5 MG PO TABS: 7.5000 mg | ORAL_TABLET | Freq: Every day | ORAL | 1 refills | Status: DC

## 2021-09-20 NOTE — Telephone Encounter (Signed)
Ok to change frequency in the med list

## 2021-09-22 ENCOUNTER — Encounter: Payer: Self-pay | Admitting: Family Medicine

## 2021-09-22 NOTE — Telephone Encounter (Signed)
Pneumovax 23 added to immunizations.

## 2021-09-22 NOTE — Telephone Encounter (Signed)
No she is up to date

## 2021-09-26 ENCOUNTER — Ambulatory Visit (INDEPENDENT_AMBULATORY_CARE_PROVIDER_SITE_OTHER): Payer: Medicare Other | Admitting: Internal Medicine

## 2021-09-26 ENCOUNTER — Encounter: Payer: Self-pay | Admitting: Internal Medicine

## 2021-09-26 VITALS — BP 128/86 | HR 75 | Ht 61.0 in | Wt 161.4 lb

## 2021-09-26 DIAGNOSIS — K649 Unspecified hemorrhoids: Secondary | ICD-10-CM | POA: Diagnosis not present

## 2021-09-26 DIAGNOSIS — K5900 Constipation, unspecified: Secondary | ICD-10-CM | POA: Diagnosis not present

## 2021-09-26 DIAGNOSIS — R159 Full incontinence of feces: Secondary | ICD-10-CM

## 2021-09-26 DIAGNOSIS — Z86010 Personal history of colonic polyps: Secondary | ICD-10-CM

## 2021-09-26 MED ORDER — HYDROCORTISONE (PERIANAL) 2.5 % EX CREA: 1.0000 | TOPICAL_CREAM | Freq: Two times a day (BID) | CUTANEOUS | 1 refills | Status: DC

## 2021-09-26 NOTE — Patient Instructions (Signed)
_______________________________________________________  If you are age 69 or older, your body mass index should be between 23-30. Your Body mass index is 30.5 kg/m. If this is out of the aforementioned range listed, please consider follow up with your Primary Care Provider.  If you are age 28 or younger, your body mass index should be between 19-25. Your Body mass index is 30.5 kg/m. If this is out of the aformentioned range listed, please consider follow up with your Primary Care Provider.   ________________________________________________________  The [facility] providers would like to encourage you to use [facility] to communicate with providers for non-urgent requests or questions.  Due to long hold times on the telephone, sending your provider a message by [facility] may be a faster and more efficient way to get a response.  Please allow 48 business hours for a response.  Please remember that this is for non-urgent requests.  _______________________________________________________  [facility] PROCEDURE    FOLLOW-UP CARE   The procedure you have had should have been relatively painless since the banding of the area involved does not have nerve endings and there is no pain sensation.  The rubber band cuts off the blood supply to the hemorrhoid and the band may fall off as soon as 48 hours after the banding (the band may occasionally be seen in the toilet bowl following a bowel movement). You may notice a temporary feeling of fullness in the rectum which should respond adequately to plain Tylenol or Motrin.  Following the banding, avoid strenuous exercise that evening and resume full activity the next day.  A sitz bath (soaking in a warm tub) or bidet is soothing, and can be useful for cleansing the area after bowel movements.     To avoid constipation, take two tablespoons of natural wheat bran, natural oat bran, flax, Benefiber or any over the counter fiber supplement and increase  your water intake to 7-8 glasses daily.    Unless you have been prescribed anorectal medication, do not put anything inside your rectum for two weeks: No suppositories, enemas, fingers, etc.  Occasionally, you may have more bleeding than usual after the banding procedure.  This is often from the untreated hemorrhoids rather than the treated one.  Don't be concerned if there is a tablespoon or so of blood.  If there is more blood than this, lie flat with your bottom higher than your head and apply an ice pack to the area. If the bleeding does not stop within a half an hour or if you feel faint, call our office at [phone] or go to the emergency room.  Problems are not common; however, if there is a substantial amount of bleeding, severe pain, chills, fever or difficulty passing urine (very rare) or other problems, you should call us at [phone] or report to the nearest emergency room.  Do not stay seated continuously for more than 2-3 hours for a day or two after the procedure.  Tighten your buttock muscles 10-15 times every two hours and take 10-15 deep breaths every 1-2 hours.  Do not spend more than a few minutes on the toilet if you cannot empty your bowel; instead re-visit the toilet at a later time.   Please start Miralax one capful a day. Or you could try 2 Kiwi's a day.      It was a pleasure to see you today!  Thank you for trusting me with your gastrointestinal care!

## 2021-09-26 NOTE — Progress Notes (Signed)
Assessment   Patient Profile:  Renee Bauer is a 71 y.o. female known to Dr. Bryan Lemma with a past medical history of adenomatous colon polyps, diet controlled diabetes, HLD, anxiety,  depression, rosacea, hysterectomy, colon resection for follow up of fecal leakage and hemorrhoids  Hemorrhoids Starting hemorrhoidal banding today.  Fecal leakage. May be overflow incontinence so will start constipation therapies  History of adenomatous colon polyps.  Last colonoscopy was in 07/2021 with removal of 5 tubular adenomas. 3 year follow up recommended  History of remote colon resection for rectal prolapse.  Records not available.   Plan  Anusol HC cream BID for 7 days Start daily Miralax Repeat hemorrhoidal banding in 1 month Consider pelvic floor PT referral in the future  HPI   Chief Complaint : follow up on fecal leakage.   Patient established care here on 06/03/2021 for evaluation of fecal leakage and history of colon polyps.  Colonoscopy 08/04/21:  - Hemorrhoids found on perianal exam. - A single colonic angioectasia. - Five 3 to 8 mm polyps in the ascending colon, removed with a cold snare. Resected and retrieved. - Diverticulosis in the ascending colon. - Patent end-to-end colo-colonic anastomosis, characterized by healthy appearing mucosa. - Normal mucosa in the entire examined colon. Biopsied. - Non-bleeding internal hemorrhoids. - The examined portion of the ileum was normal. Path: 1. Surgical [P], random right colon biopsy - COLONIC MUCOSA WITH NO SPECIFIC HISTOPATHOLOGIC CHANGES - NEGATIVE FOR ACUTE INFLAMMATION, INCREASED INTRAEPITHELIAL LYMPHOCYTES OR THICKENED SUBEPITHELIAL COLLAGEN TABLE 2. Surgical [P], colon, ascending, polyp (5) - TUBULAR ADENOMA(S) - NEGATIVE FOR HIGH-GRADE DYSPLASIA OR MALIGNANCY 3. Surgical [P], random left colon biopsy - COLONIC MUCOSA WITH NO SPECIFIC HISTOPATHOLOGIC CHANGES - NEGATIVE FOR ACUTE INFLAMMATION, INCREASED  INTRAEPITHELIAL LYMPHOCYTES OR THICKENED SUBEPITHELIAL COLLAGEN TABLE  Interval history :  Patient has still had some rectal discomfort related to her hemorrhoids. She would like to get treatment for her hemorrhoids today by starting hemorrhoidal banding. Denies rectal bleeding. She is on average having 1 BM per other day. Not taking any laxatives. Using glycerin suppositories.  Labs:     Latest Ref Rng & Units 05/18/2021    3:52 PM 12/01/2020   11:19 AM 03/24/2020    9:06 AM  CBC  WBC 4.0 - 10.5 K/uL 7.0  7.4  6.3   Hemoglobin 12.0 - 15.0 g/dL 14.2  13.7  14.5   Hematocrit 36.0 - 46.0 % 43.1  41.5  42.8   Platelets 150.0 - 400.0 K/uL 216.0  265.0  267.0        Latest Ref Rng & Units 05/18/2021    3:52 PM 12/01/2020   11:19 AM 03/24/2020    9:06 AM  Hepatic Function  Total Protein 6.0 - 8.3 g/dL 7.2  6.8  7.0   Albumin 3.5 - 5.2 g/dL 4.4  4.2  4.4   AST 0 - 37 U/L '18  14  17   ' ALT 0 - 35 U/L '12  9  13   ' Alk Phosphatase 39 - 117 U/L 78  72  70   Total Bilirubin 0.2 - 1.2 mg/dL 0.5  0.4  0.5      Current Outpatient Medications  Medication Sig Dispense Refill   ALPRAZolam (XANAX) 1 MG tablet TAKE 1 TABLET AS NEEDED FOR ANXIETY 30 tablet 2   CALCIUM CITRATE PO Take 600 mg by mouth daily.     Cholecalciferol (VITAMIN D3 PO) Take 2,000 Units by mouth daily.     esomeprazole (  NEXIUM) 40 MG capsule Take 1 capsule (40 mg total) by mouth daily. 90 capsule 1   fluticasone (FLONASE) 50 MCG/ACT nasal spray Place 2 sprays into both nostrils daily. 48 g 3   gabapentin (NEURONTIN) 100 MG capsule TAKE 1 CAPSULE THREE TIMES A DAY 90 capsule 11   lidocaine-prilocaine (EMLA) cream APPLY 1 APPLICATION TOPICALLY AS NEEDED 30 g 11   meloxicam (MOBIC) 7.5 MG tablet Take 1 tablet (7.5 mg total) by mouth daily. 180 tablet 1   METRONIDAZOLE, TOPICAL, 0.75 % LOTN as needed.      montelukast (SINGULAIR) 10 MG tablet Take 1 tablet (10 mg total) by mouth at bedtime. (Patient taking differently: Take 10 mg by  mouth as needed.) 90 tablet 3   OVER THE COUNTER MEDICATION Zeasorb powder     rosuvastatin (CRESTOR) 10 MG tablet Take 1 tablet (10 mg total) by mouth at bedtime. 90 tablet 1   TERBINAFINE EX Apply topically.     vitamin B-12 (CYANOCOBALAMIN) 1000 MCG tablet Take 1,000 mcg by mouth daily.     zolpidem (AMBIEN CR) 6.25 MG CR tablet Take 1 tablet (6.25 mg total) by mouth at bedtime as needed for sleep. 90 tablet 3   No current facility-administered medications for this visit.   Physical Exam  Wt Readings from Last 3 Encounters:  09/26/21 161 lb 6.4 oz (73.2 kg)  09/09/21 161 lb (73 kg)  08/17/21 170 lb (77.1 kg)    BP 128/86   Pulse 75   Ht '5\' 1"'  (1.549 m)   Wt 161 lb 6.4 oz (73.2 kg)   SpO2 97%   BMI 30.50 kg/m  Constitutional:  Generally well appearing female in no acute distress. Psychiatric: Pleasant. Normal mood and affect. Behavior is normal. EENT: Pupils normal.  Conjunctivae are normal. No scleral icterus. Neck supple.  Cardiovascular: Normal rate Pulmonary/chest: Effort normal Abdominal: Soft Rectal: Internal hemorrhoids seen on anoscopy exam. Neurological: Alert and oriented to person place and time. Skin: Skin is warm and dry. No rashes noted.  Sharyn Creamer, NP  09/26/2021, 10:26 AM   PROCEDURE NOTE: The patient presents with symptomatic grade 2  hemorrhoids, requesting rubber band ligation of his/her hemorrhoidal disease.  All risks, benefits and alternative forms of therapy were described and informed consent was obtained.  In the Left Lateral Decubitus position anoscopic examination revealed grade 2 hemorrhoids. The anorectum was pre-medicated with nitroglycerin and Recticare The decision was made to band the posterior internal hemorrhoid, and the [place] was used to perform band ligation without complication.  Digital anorectal examination was then performed to assure proper positioning of the band, and to adjust the banded tissue as required.   The patient was discharged home without pain or other issues.  Dietary and behavioral recommendations were given and along with follow-up instructions.    The patient will return in 4 weeks for  follow-up and possible additional banding as required. No complications were encountered and the patient tolerated the procedure well.

## 2021-10-27 ENCOUNTER — Encounter: Payer: Self-pay | Admitting: Family Medicine

## 2021-10-27 DIAGNOSIS — E1142 Type 2 diabetes mellitus with diabetic polyneuropathy: Secondary | ICD-10-CM

## 2021-11-04 ENCOUNTER — Other Ambulatory Visit: Payer: Self-pay | Admitting: Family Medicine

## 2021-11-04 DIAGNOSIS — M8589 Other specified disorders of bone density and structure, multiple sites: Secondary | ICD-10-CM

## 2021-11-08 ENCOUNTER — Encounter: Payer: Self-pay | Admitting: Internal Medicine

## 2021-11-08 ENCOUNTER — Other Ambulatory Visit: Payer: Medicare Other

## 2021-11-08 ENCOUNTER — Ambulatory Visit (INDEPENDENT_AMBULATORY_CARE_PROVIDER_SITE_OTHER): Payer: Medicare Other | Admitting: Internal Medicine

## 2021-11-08 ENCOUNTER — Ambulatory Visit
Admission: RE | Admit: 2021-11-08 | Discharge: 2021-11-08 | Disposition: A | Discharge status: Home / Self Care | Payer: Medicare Other | Source: Ambulatory Visit | Attending: Family Medicine | Admitting: Family Medicine

## 2021-11-08 VITALS — BP 122/86 | HR 100 | Ht 61.0 in | Wt 163.2 lb

## 2021-11-08 DIAGNOSIS — R159 Full incontinence of feces: Secondary | ICD-10-CM | POA: Diagnosis not present

## 2021-11-08 DIAGNOSIS — M8589 Other specified disorders of bone density and structure, multiple sites: Secondary | ICD-10-CM

## 2021-11-08 DIAGNOSIS — K5900 Constipation, unspecified: Secondary | ICD-10-CM

## 2021-11-08 DIAGNOSIS — K649 Unspecified hemorrhoids: Secondary | ICD-10-CM | POA: Diagnosis not present

## 2021-11-08 MED ORDER — POLYETHYLENE GLYCOL 3350 17 G PO PACK: 17.0000 g | PACK | Freq: Every day | ORAL | 0 refills | Status: DC

## 2021-11-08 NOTE — Patient Instructions (Addendum)
If you are age 69 or older, your body mass index should be between 23-30. Your Body mass index is 30.84 kg/m. If this is out of the aforementioned range listed, please consider follow up with your Primary Care Provider.  If you are age 70 or younger, your body mass index should be between 19-25. Your Body mass index is 30.84 kg/m. If this is out of the aformentioned range listed, please consider follow up with your Primary Care Provider.   ________________________________________________________   Increase Miralax to once daily.  We are referring you to Pelvic Floor Physical Therapy.  They will contact you directly to schedule an appointment.  It may take a week or more before you hear from them.  Please feel free to contact us if you have not heard from them within 2 weeks and we will follow up on the referral.    You are scheduled for your 2nd banding appointment on Tuesday, 10-31 at 9:30 am.  Please let us know if you need to reschedule this appointment: [phone].  Thank you for entrusting me with your care and for choosing [facility], Dr. Christia Reading

## 2021-11-08 NOTE — Progress Notes (Signed)
Findings show some mild bone loss, but medication is not indicated at this time. I recommend 1200 mg of calcium and 2000 units of vitamin D daily.

## 2021-11-08 NOTE — Progress Notes (Signed)
Assessment   Patient Profile:  Renee Bauer is a 71 y.o. female with a past medical history of adenomatous colon polyps, diet controlled diabetes, HLD, anxiety,  depression, rosacea, hysterectomy, colon resection for follow up of fecal leakage and hemorrhoids  Hemorrhoids Patient does not want to do hemorrhoidal banding today but is scheduled for later this month.  Fecal leakage. Suspected to be due to overflow incontinence. Will have her increase her Miralax intake and referral for pelvic floor PT. If she does not respond to Miralax in the future, could consider Linzess  History of adenomatous colon polyps.  Last colonoscopy was in 07/2021 with removal of 5 tubular adenomas. 3 year follow up recommended  History of remote colon resection for rectal prolapse.  Records not available.   Plan  Encourage her to take Miralax daily Continue Metamucil Repeat hemorrhoidal banding Pelvic floor PT referral for fecal incontinence Can consider Linzess in the future if Miralax if not adequately effective  HPI   Chief Complaint : follow up on fecal leakage.   Patient established care here on 06/03/2021 for evaluation of fecal leakage and history of colon polyps.  Colonoscopy 08/04/21:  - Hemorrhoids found on perianal exam. - A single colonic angioectasia. - Five 3 to 8 mm polyps in the ascending colon, removed with a cold snare. Resected and retrieved. - Diverticulosis in the ascending colon. - Patent end-to-end colo-colonic anastomosis, characterized by healthy appearing mucosa. - Normal mucosa in the entire examined colon. Biopsied. - Non-bleeding internal hemorrhoids. - The examined portion of the ileum was normal. Path: 1. Surgical [P], random right colon biopsy - COLONIC MUCOSA WITH NO SPECIFIC HISTOPATHOLOGIC CHANGES - NEGATIVE FOR ACUTE INFLAMMATION, INCREASED INTRAEPITHELIAL LYMPHOCYTES OR THICKENED SUBEPITHELIAL COLLAGEN TABLE 2. Surgical [P], colon, ascending, polyp (5) -  TUBULAR ADENOMA(S) - NEGATIVE FOR HIGH-GRADE DYSPLASIA OR MALIGNANCY 3. Surgical [P], random left colon biopsy - COLONIC MUCOSA WITH NO SPECIFIC HISTOPATHOLOGIC CHANGES - NEGATIVE FOR ACUTE INFLAMMATION, INCREASED INTRAEPITHELIAL LYMPHOCYTES OR THICKENED SUBEPITHELIAL COLLAGEN TABLE  Interval history :  Patient has still had some fecal leakage, usually every few days. She is no aware when the leakage is happening but does notice after it has occurred. She is on average having one BM every 3 days. She has been taking Miralax, which has helped with her constipation. She has not been taking Miralax every day because she feels like sometimes she would have too many BMs. Occasionally sees rectal bleeding, which she attributes to hemorrhoids. Denies abdominal pain or rectal pain.  Labs:     Latest Ref Rng & Units 05/18/2021    3:52 PM 12/01/2020   11:19 AM 03/24/2020    9:06 AM  CBC  WBC 4.0 - 10.5 K/uL 7.0  7.4  6.3   Hemoglobin 12.0 - 15.0 g/dL 14.2  13.7  14.5   Hematocrit 36.0 - 46.0 % 43.1  41.5  42.8   Platelets 150.0 - 400.0 K/uL 216.0  265.0  267.0        Latest Ref Rng & Units 05/18/2021    3:52 PM 12/01/2020   11:19 AM 03/24/2020    9:06 AM  Hepatic Function  Total Protein 6.0 - 8.3 g/dL 7.2  6.8  7.0   Albumin 3.5 - 5.2 g/dL 4.4  4.2  4.4   AST 0 - 37 U/L _0 ALT 0 - 35 U/L _1 Alk Phosphatase 39 - 117 U/L 78  72  70   Total Bilirubin 0.2 - 1.2 mg/dL 0.5  0.4  0.5      Current Outpatient Medications  Medication Sig Dispense Refill   ALPRAZolam (XANAX) 1 MG tablet TAKE 1 TABLET AS NEEDED FOR ANXIETY 30 tablet 2   CALCIUM CITRATE PO Take 600 mg by mouth daily.     Cholecalciferol (VITAMIN D3 PO) Take 2,000 Units by mouth daily.     esomeprazole (NEXIUM) 40 MG capsule Take 1 capsule (40 mg total) by mouth daily. 90 capsule 1   fluticasone (FLONASE) 50 MCG/ACT nasal spray Place 2 sprays into both nostrils daily. 48 g 3   gabapentin (NEURONTIN) 100 MG capsule  TAKE 1 CAPSULE THREE TIMES A DAY 90 capsule 11   hydrocortisone (ANUSOL-HC) 2.5 % rectal cream Place 1 Application rectally 2 (two) times daily. 30 g 1   lidocaine-prilocaine (EMLA) cream APPLY 1 APPLICATION TOPICALLY AS NEEDED 30 g 11   meloxicam (MOBIC) 7.5 MG tablet Take 1 tablet (7.5 mg total) by mouth daily. 180 tablet 1   METRONIDAZOLE, TOPICAL, 0.75 % LOTN as needed.      montelukast (SINGULAIR) 10 MG tablet Take 1 tablet (10 mg total) by mouth at bedtime. (Patient taking differently: Take 10 mg by mouth as needed.) 90 tablet 3   OVER THE COUNTER MEDICATION Zeasorb powder     rosuvastatin (CRESTOR) 10 MG tablet Take 1 tablet (10 mg total) by mouth at bedtime. 90 tablet 1   TERBINAFINE EX Apply topically.     vitamin B-12 (CYANOCOBALAMIN) 1000 MCG tablet Take 1,000 mcg by mouth daily.     zolpidem (AMBIEN CR) 6.25 MG CR tablet Take 1 tablet (6.25 mg total) by mouth at bedtime as needed for sleep. 90 tablet 3   No current facility-administered medications for this visit.   Physical Exam  Wt Readings from Last 3 Encounters:  11/08/21 163 lb 3.2 oz (74 kg)  09/26/21 161 lb 6.4 oz (73.2 kg)  09/09/21 161 lb (73 kg)    BP 122/86   Pulse 100   Ht 5' 1" (1.549 m)   Wt 163 lb 3.2 oz (74 kg)   SpO2 92%   BMI 30.84 kg/m  Constitutional:  Generally well appearing female in no acute distress. Psychiatric: Pleasant. Normal mood and affect. Behavior is normal. EENT: Pupils normal.  Conjunctivae are normal. No scleral icterus. Neck supple.  Cardiovascular: Normal rate Pulmonary/chest: Effort normal Abdominal: Soft Neurological: Alert and oriented to person place and time. Skin: Skin is warm and dry. No rashes noted.  Ying C Dorsey, MD 11/08/2021, 2:16 PM 

## 2021-11-09 NOTE — Progress Notes (Signed)
Renee Bauer Renee Bauer Phone: [phone] Subjective:   Renee Bauer, am serving as a scribe for Dr. Hulan Saas. I'm seeing this patient by the request  of:  Farrel Conners, MD  CC: Left knee pain  TMH:[id number]  08/17/2021 Continues to have the left knee pain.  The calcific changes that was noted somewhat on the previous ultrasound does seem to be resolving.  We discussed with patient know if continuing to have pain around the is a specialized MRI but I would consider a second opinion by orthopedic surgery.  At the moment though after discussing for quite some time we did discuss maybe repeating formal physical therapy will be helpful and patient will be referred.  Patient is in agreement with this plan and will follow-up with me again in 2 months to see how she is responding.  Total time 33 minutes  Update 11/10/2021 Renee Bauer is a 71 y.o. female coming in with complaint of L knee pain. Hx of TKR.  Patient was referred to formal physical therapy.  Reviewing patient's chart there was seen by orthopedic surgery our health system on September 5.  Reviewing the notes orthopedic surgery did not feel the patient's medial knee pain was secondary to VMO weakness.  Patient has had other modalities including the Pez anserine bursa injection with Bauer significant improvement.  Patient states that she continues to do PT. ROM improves but continues to have pain over quad tendon and patellar tendon. Has some red spots that come and go over medial aspect of knee.   Imaging included a bone scan previously that did show a nonfocal blood pool uptake in the left knee within the normal range replacements but borderline     Past Medical History:  Diagnosis Date   Anxiety    Arthritis    Benign colon polyp 2018   Bursitis    Depression    Diabetes mellitus without complication (HCC)    GERD (gastroesophageal reflux disease)     Hyperlipidemia    Rosacea    Past Surgical History:  Procedure Laterality Date   ABDOMINAL HYSTERECTOMY     menorrhagia   BLADDER REPAIR     BREAST BIOPSY Left    BUNIONECTOMY Right    COLON RESECTION  2007   COLONOSCOPY     DILATION AND CURETTAGE OF UTERUS     TOTAL KNEE ARTHROPLASTY Left 09/29/2020   Social History   Socioeconomic History   Marital status: Married    Spouse name: Not on file   Number of children: 2   Years of education: Not on file   Highest education level: Not on file  Occupational History   Occupation: Retired  Tobacco Use   Smoking status: Never   Smokeless tobacco: Never  Vaping Use   Vaping Use: Never used  Substance and Sexual Activity   Alcohol use: Yes    Comment: occasional wine   Drug use: Never   Sexual activity: Not on file  Other Topics Concern   Not on file  Social History Narrative   Not on file   Social Determinants of Health   Financial Resource Strain: Low Risk  (06/06/2021)   Overall Financial Resource Strain (CARDIA)    Difficulty of Paying Living Expenses: Not hard at all  Food Insecurity: Bauer Food Insecurity (06/06/2021)   Hunger Vital Sign    Worried About Running Out of Food in the Last Year: Never true  Ran Out of Food in the Last Year: Never true  Transportation Needs: Bauer Transportation Needs (06/06/2021)   PRAPARE - Hydrologist (Medical): Bauer    Lack of Transportation (Non-Medical): Bauer  Physical Activity: Sufficiently Active (06/06/2021)   Exercise Vital Sign    Days of Exercise per Week: 5 days    Minutes of Exercise per Session: 50 min  Stress: Bauer Stress Concern Present (06/06/2021)   [place]    Feeling of Stress : Only a little  Social Connections: Socially Integrated (06/06/2021)   Social Connection and Isolation Panel [NHANES]    Frequency of Communication with Friends and Family: Twice a week    Frequency of Social  Gatherings with Friends and Family: Once a week    Attends Religious Services: More than 4 times per year    Active Member of [employer] or Organizations: Yes    Attends Music therapist: More than 4 times per year    Marital Status: Married   Allergies  Allergen Reactions   Tramadol     Felt sick with this   Wellbutrin [Bupropion]     Muscle twitches   Clindamycin Hcl Rash   Family History  Problem Relation Age of Onset   Diabetes Mother    Arrhythmia Mother    High blood pressure Mother    Mesothelioma Father        asbestos exposure   Allergic rhinitis Father    Sinusitis Father    Healthy Sister    Post-traumatic stress disorder Son    Scoliosis Son    Brain cancer Maternal Aunt    Asthma Neg Hx    Eczema Neg Hx    Urticaria Neg Hx    Angioedema Neg Hx    Immunodeficiency Neg Hx    Atopy Neg Hx    Colon cancer Neg Hx    Esophageal cancer Neg Hx    Stomach cancer Neg Hx    Colon polyps Neg Hx      Current Outpatient Medications (Cardiovascular):    rosuvastatin (CRESTOR) 10 MG tablet, Take 1 tablet (10 mg total) by mouth at bedtime.  Current Outpatient Medications (Respiratory):    fluticasone (FLONASE) 50 MCG/ACT nasal spray, Place 2 sprays into both nostrils daily.   montelukast (SINGULAIR) 10 MG tablet, Take 1 tablet (10 mg total) by mouth at bedtime. (Patient taking differently: Take 10 mg by mouth as needed.)  Current Outpatient Medications (Analgesics):    meloxicam (MOBIC) 7.5 MG tablet, Take 1 tablet (7.5 mg total) by mouth daily.  Current Outpatient Medications (Hematological):    vitamin B-12 (CYANOCOBALAMIN) 1000 MCG tablet, Take 1,000 mcg by mouth daily.  Current Outpatient Medications (Other):    acyclovir (ZOVIRAX) 400 MG tablet, Take 1 tablet (400 mg total) by mouth 3 (three) times daily.   ALPRAZolam (XANAX) 1 MG tablet, TAKE 1 TABLET AS NEEDED FOR ANXIETY   CALCIUM CITRATE PO, Take 600 mg by mouth daily.   Cholecalciferol (VITAMIN  D3 PO), Take 2,000 Units by mouth daily.   esomeprazole (NEXIUM) 40 MG capsule, Take 1 capsule (40 mg total) by mouth daily.   gabapentin (NEURONTIN) 100 MG capsule, TAKE 1 CAPSULE THREE TIMES A DAY   hydrocortisone (ANUSOL-HC) 2.5 % rectal cream, Place 1 Application rectally 2 (two) times daily.   lidocaine-prilocaine (EMLA) cream, APPLY 1 APPLICATION TOPICALLY AS NEEDED   METRONIDAZOLE, TOPICAL, 0.75 % LOTN, as needed.  OVER THE COUNTER MEDICATION, Zeasorb powder   polyethylene glycol (MIRALAX) 17 g packet, Take 17 g by mouth daily.   TERBINAFINE EX, Apply topically.   zolpidem (AMBIEN CR) 6.25 MG CR tablet, Take 1 tablet (6.25 mg total) by mouth at bedtime as needed for sleep.   Reviewed prior external information including notes and imaging from  primary care provider As well as notes that were available from care everywhere and other healthcare systems.  Past medical history, social, surgical and family history all reviewed in electronic medical record.  Bauer pertanent information unless stated regarding to the chief complaint.   Review of Systems:  Bauer headache, visual changes, nausea, vomiting, diarrhea, constipation, dizziness, abdominal pain, skin rash, fevers, chills, night sweats, weight loss, swollen lymph nodes, body aches,  Objective  Blood pressure 116/84, pulse 89, height '5\' 1"'$  (1.549 m), SpO2 97 %.   General: Bauer apparent distress alert and oriented x3 mood and affect normal, dressed appropriately.  HEENT: Pupils equal, extraocular movements intact  Respiratory: Patient's speak in full sentences and does not appear short of breath  Cardiovascular: Bauer lower extremity edema, non tender, Bauer erythema  Left knee does still have a trace effusion noted.  Flexion of 95 degrees.  The patient is even tender to palpation with very light palpation over the medial aspect of the knee.  A couple areas of macular changes noted.  Patient does have hypopigmentation that is likely secondary  to previous injection.  Limited muscular skeletal ultrasound was performed and interpreted by Hulan Saas, M  Limited ultrasound shows there is some hypoechoic changes in the patellofemoral area.  Bauer increased though in neovascularization or Doppler flow that is consistent with any type of soft tissue infectious etiology. Impression: Continued fusion of the patellofemoral joint    Impression and Recommendations:     The above documentation has been reviewed and is accurate and complete Lyndal Pulley, DO

## 2021-11-10 ENCOUNTER — Ambulatory Visit (INDEPENDENT_AMBULATORY_CARE_PROVIDER_SITE_OTHER): Payer: Medicare Other | Admitting: Family Medicine

## 2021-11-10 DIAGNOSIS — M25562 Pain in left knee: Secondary | ICD-10-CM

## 2021-11-10 DIAGNOSIS — Z96652 Presence of left artificial knee joint: Secondary | ICD-10-CM

## 2021-11-10 DIAGNOSIS — G8929 Other chronic pain: Secondary | ICD-10-CM | POA: Diagnosis not present

## 2021-11-10 MED ORDER — ACYCLOVIR 400 MG PO TABS: 400.0000 mg | ORAL_TABLET | Freq: Three times a day (TID) | ORAL | 0 refills | Status: DC

## 2021-11-10 NOTE — Patient Instructions (Signed)
Sorry we Lynnae Prude have an answer Acycliovir '400mg'$  TID for 5 days K2 246mg for 4 weeks Give uKoreaupdate after acyclovir IBU or Meloxicam but not both See me in 3 months

## 2021-11-10 NOTE — Assessment & Plan Note (Signed)
Patient on ultrasound still has unfortunately hypoechoic changes of the patellofemoral area.  No notable increase in Doppler flow that is concerning for any type of infectious etiology.  We discussed with patient having some of the skin changes that this could either be secondary to the corticosteroid injection with some fat atrophy causing some more fragility of the capillaries or the possibility as this could be a smoldering shingles breakout if patient does truly have it where it is a weeping aspect.  We will try acyclovir 3 times a day for 5 days, continue the gabapentin.  We discussed with patient that if continuing to have pain in 6 to 9 months we do need to consider another bone scan or a low-dose MRI with metal protocol.  Follow-up with me again in 3 months otherwise.  Total time reviewing outside records, imaging and discussing with patient 33 minutes

## 2021-11-11 ENCOUNTER — Ambulatory Visit (INDEPENDENT_AMBULATORY_CARE_PROVIDER_SITE_OTHER): Payer: Medicare Other | Admitting: Podiatry

## 2021-11-11 DIAGNOSIS — E1142 Type 2 diabetes mellitus with diabetic polyneuropathy: Secondary | ICD-10-CM | POA: Diagnosis not present

## 2021-11-11 DIAGNOSIS — B351 Tinea unguium: Secondary | ICD-10-CM | POA: Diagnosis not present

## 2021-11-11 DIAGNOSIS — M79674 Pain in right toe(s): Secondary | ICD-10-CM | POA: Diagnosis not present

## 2021-11-11 DIAGNOSIS — M79675 Pain in left toe(s): Secondary | ICD-10-CM | POA: Diagnosis not present

## 2021-11-11 NOTE — Progress Notes (Signed)
  Subjective:  Patient ID: Renee Bauer, female    DOB: 02-02-1950,  MRN: [id number]  Chief Complaint  Patient presents with   Nail Problem   71 y.o. female returns for the above complaint.  Patient presents with thickened elongated dystrophic toenails x10.  Pain full to touch.  She is a diabetic with last A1c of 5.7.  She would like to have her nails debrided down.  She denies any other acute complaints.  Objective:  There were no vitals filed for this visit. Podiatric Exam: Vascular: dorsalis pedis and posterior tibial pulses are palpable bilateral. Capillary return is immediate. Temperature gradient is WNL. Skin turgor WNL  Sensorium: Normal Semmes Weinstein monofilament test. Normal tactile sensation bilaterally. Nail Exam: Pt has thick disfigured discolored nails with subungual debris noted bilateral entire nail hallux through fifth toenails.  Except left second digit due to previous total nail avulsion.  Pain on palpation to the nails. Ulcer Exam: There is no evidence of ulcer or pre-ulcerative changes or infection. Orthopedic Exam: Muscle tone and strength are WNL. No limitations in general ROM. No crepitus or effusions noted. HAV  B/L.  Hammer toes 2-5  B/L. Skin: No Porokeratosis. No infection or ulcers    Assessment & Plan:   No diagnosis found.     Patient was evaluated and treated and all questions answered.  Onychomycosis with pain  -Nails palliatively debrided as below. -Educated on self-care  Procedure: Nail Debridement Rationale: pain  Type of Debridement: manual, sharp debridement. Instrumentation: Nail nipper, rotary burr. Number of Nails: 9  Procedures and Treatment: Consent by patient was obtained for treatment procedures. The patient understood the discussion of treatment and procedures well. All questions were answered thoroughly reviewed. Debridement of mycotic and hypertrophic toenails, 1 through 5 bilateral and clearing of subungual debris. No  ulceration, no infection noted.  Return Visit-Office Procedure: Patient instructed to return to the office for a follow up visit 3 months for continued evaluation and treatment.  Boneta Lucks, DPM    No follow-ups on file.

## 2021-11-22 ENCOUNTER — Ambulatory Visit: Payer: Medicare Other | Attending: Internal Medicine | Admitting: Physical Therapy

## 2021-11-22 ENCOUNTER — Other Ambulatory Visit: Payer: Self-pay

## 2021-11-22 DIAGNOSIS — R159 Full incontinence of feces: Secondary | ICD-10-CM | POA: Diagnosis not present

## 2021-11-22 DIAGNOSIS — R279 Unspecified lack of coordination: Secondary | ICD-10-CM | POA: Diagnosis not present

## 2021-11-22 DIAGNOSIS — R293 Abnormal posture: Secondary | ICD-10-CM | POA: Insufficient documentation

## 2021-11-22 DIAGNOSIS — M6281 Muscle weakness (generalized): Secondary | ICD-10-CM | POA: Insufficient documentation

## 2021-11-22 NOTE — Patient Instructions (Signed)

## 2021-11-22 NOTE — Therapy (Signed)
OUTPATIENT PHYSICAL THERAPY FEMALE PELVIC EVALUATION   Patient Name: Renee Bauer MRN: [id number] DOB:Aug 22, 1950, 71 y.o., female Today's Date: 11/22/2021   PT End of Session - 11/22/21 1021     Visit Number 1    Date for PT Re-Evaluation 02/22/22    Authorization Type [employer] medicare    Progress Note Due on Visit 10    PT Start Time 1020   pt arrival time   PT Stop Time 1058    PT Time Calculation (min) 38 min    Activity Tolerance Patient tolerated treatment well    Behavior During Therapy WFL for tasks assessed/performed             Past Medical History:  Diagnosis Date   Anxiety    Arthritis    Benign colon polyp 2018   Bursitis    Depression    Diabetes mellitus without complication (HCC)    GERD (gastroesophageal reflux disease)    Hyperlipidemia    Rosacea    Past Surgical History:  Procedure Laterality Date   ABDOMINAL HYSTERECTOMY     menorrhagia   BLADDER REPAIR     BREAST BIOPSY Left    BUNIONECTOMY Right    COLON RESECTION  2007   COLONOSCOPY     DILATION AND CURETTAGE OF UTERUS     TOTAL KNEE ARTHROPLASTY Left 09/29/2020   Patient Active Problem List   Diagnosis Date Noted   GERD without esophagitis 09/11/2021   Chronic knee pain 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 of left knee joint 07/06/2021   Greater trochanteric bursitis of both hips 05/23/2021   Primary osteoarthritis of both hands 07/06/2019   Primary osteoarthritis of both knees 07/06/2019   Unilateral primary osteoarthritis, left knee 10/31/2018   Arthritis 10/30/2018   DM (diabetes mellitus) type II controlled, neurological manifestation ([place]) 10/30/2018   Neuropathy of both feet 10/30/2018   Anxiety 10/30/2018   Insomnia 10/30/2018   History of skin cancer 10/30/2018   Osteopenia 09/30/2018    PCP: Omar Person female, MD  REFERRING PROVIDER: Sharyn Creamer, MD  REFERRING DIAG: R15.9 (ICD-10-CM) - Incontinence of feces, unspecified fecal incontinence type  THERAPY DIAG:  Muscle  weakness (generalized)  Unspecified lack of coordination  Abnormal posture  Rationale for Evaluation and Treatment Rehabilitation  ONSET DATE: Few months ago  SUBJECTIVE:                                                                                                                                                                                           SUBJECTIVE STATEMENT: Bowel leakage intermittently, does have sensation that leakage is happening. Does not note a pattern  with leakage. Has had hemorrhoids banded internally, does have one external hemorrhoids. Does feel the need to push or strain with bowel movements. Metamucil every other day, helps some but miralax makes stools too soft and leakage worse. Usually bowel movement every 3 days.   Fluid intake: Yes: water -6-8 glasses; large coffee in morning    PAIN:  Are you having pain? No  PRECAUTIONS: None  WEIGHT BEARING RESTRICTIONS: No  FALLS:  Has patient fallen in last 6 months? No  LIVING ENVIRONMENT: Lives with: lives with their spouse Lives in: House/apartment   OCCUPATION: retired   PLOF: Independent  PATIENT GOALS: to have less leakage   PERTINENT HISTORY:  adenomatous colon polyps, diet controlled diabetes, HLD, anxiety,  depression, rosacea, hysterectomy, colon resection for rectal prolapse for follow up of fecal leakage and hemorrhoids, bladder lift Sexual abuse: No  BOWEL MOVEMENT: Pain with bowel movement: No Type of bowel movement:Type (Bristol Stool Scale) 4-5, Frequency every 3 days, and Strain Yes Fully empty rectum: No - sometimes feels like stool is left then doing activity will have urge to empty. Leakage: Yes: no pattern per pt. Does note with urge to have bowel movement sometimes will have leakage if she tries to finish a task. And thinks she has short warning before bowel movement with urge Pads: Yes: started wearing when needed Fiber supplement: Yes: metamucil  URINATION: Pain with  urination: No Fully empty bladder: No Stream: Strong Urgency: No Frequency: 1.5 hours during the day, every 2 hours at night.  Leakage:  none Pads: No  INTERCOURSE: Pain with intercourse:  not active   PREGNANCY: Vaginal deliveries 2 Tearing Yes: forceps with son and needed stitches  C-section deliveries 0 Currently pregnant No  PROLAPSE: None   OBJECTIVE:   DIAGNOSTIC FINDINGS:    COGNITION: Overall cognitive status: Within functional limits for tasks assessed     SENSATION: Light touch: Appears intact Proprioception: Appears intact  MUSCLE LENGTH: Bil hamstring and adductors limited by 25%  POSTURE: rounded shoulders and forward head  PELVIC ALIGNMENT:  LUMBARAROM/PROM:  A/PROM A/PROM  eval  Flexion WFL  Extension WFL  Right lateral flexion Limited by 25%  Left lateral flexion Limited by 25%  Right rotation Limited by 25%  Left rotation Limited by 25%   (Blank rows = not tested)  LOWER EXTREMITY ROM:  WFL  LOWER EXTREMITY MMT:  Bil Hips grossly 4/5, knees and ankles 5/5  PALPATION:   General  no pain, fascial restrictions noted throughout abdomen mildly.                 External Perineal Exam [facility]                             Internal Pelvic Floor [facility]  Patient confirms identification and approves PT to assess internal pelvic floor and treatment Yes  PELVIC MMT:   MMT eval  Vaginal 2/5; 4s; 6 reps  Internal Anal Sphincter 2/5 6s; 8 reps  External Anal Sphincter 2/5 6s; 8 reps  Puborectalis 2/5 6s; 8 reps  Diastasis Recti   (Blank rows = not tested)       * pt does demonstrate pushing down with attempts to contract initially vaginally and improved with cues for technique. Rectally, pt demonstrated slight downward pushing after initial contract in attempt to hold isometric contraction.  TONE: WFL  PROLAPSE: Possible anterior wall laxity grade 1, not seen in posterior wall  TODAY'S TREATMENT:  DATE:  EVAL Examination completed, findings reviewed, pt educated on POC, HEP, and abdominal massage and urge drill. Pt motivated to participate in PT and agreeable to attempt recommendations.     PATIENT EDUCATION:  Education details: [id number] Person educated: Patient Education method: Explanation, Demonstration, Tactile cues, Verbal cues, and Handouts Education comprehension: verbalized understanding and returned demonstration  HOME EXERCISE PROGRAM: [id number]  ASSESSMENT:  CLINICAL IMPRESSION: Patient is a 71 y.o. female  who was seen today for physical therapy evaluation and treatment for incontinence of feces. Pt reports small stool leakages intermittently and intermittent constipation having bowel movement every 3 days and needing to strain sometimes. Pt found to have decreased flexibility in spine and bil hips, mild decreased strength in hips, core. Pt consented to internal vaginal and rectal assessment this date and found to have decreased strength, coordination, and endurance.  Pt would benefit from additional PT to further address deficits.    OBJECTIVE IMPAIRMENTS: decreased coordination, decreased endurance, decreased strength, increased fascial restrictions, impaired flexibility, improper body mechanics, and postural dysfunction.   ACTIVITY LIMITATIONS: continence  PARTICIPATION LIMITATIONS: community activity  PERSONAL FACTORS: Time since onset of injury/illness/exacerbation and 1 comorbidity: medical history  are also affecting patient's functional outcome.   REHAB POTENTIAL: Good  CLINICAL DECISION MAKING: Stable/uncomplicated  EVALUATION COMPLEXITY: Low   GOALS: Goals reviewed with patient? Yes  SHORT TERM GOALS: Target date: 12/20/2021  Pt to be I with HEP.  Baseline: Goal status: INITIAL  2.   Pt to be I with voiding mechanics, breathing mechanics, abdominal massage to improve bowel  habits.  Baseline:  Goal status: INITIAL  3.  Pt will be able to functional actions such as walking 30 mins without leakage  Baseline:  Goal status: INITIAL  4.  Pt to demonstrate at least 3/5 pelvic floor strength for improved pelvic stability and decreased strain at pelvic floor/ decrease leakage.  Baseline:  Goal status: INITIAL   LONG TERM GOALS: Target date:  02/22/22    Pt to demonstrate at least 4/5 pelvic floor strength for improved pelvic stability and decreased strain at pelvic floor/ decrease leakage.  Baseline:  Goal status: INITIAL  2.  Pt to report stool types at type 3-5 for improved consistently and decreased leakage.  Baseline:  Goal status: INITIAL  3.  Pt will report her BMs are complete due to improved bowel habits and evacuation techniques.  Baseline:  Goal status: INITIAL  4.  Pt to demonstrate at least 5/5 bil hip strength for improved pelvic stability and functional squats without leakage.  Baseline:  Goal status: INITIAL   PLAN:  PT FREQUENCY: 1x/week  PT DURATION:  8 sessions  PLANNED INTERVENTIONS: Therapeutic exercises, Therapeutic activity, Neuromuscular re-education, Patient/Family education, Self Care, Joint mobilization, Aquatic Therapy, Dry Needling, Spinal mobilization, Cryotherapy, Moist heat, scar mobilization, Taping, Biofeedback, and Manual therapy  PLAN FOR NEXT SESSION: coordination of pelvic floor and breathing mechanics and strengthening, core and hip strengthening with pelvic floor coordination.    Stacy Gardner, PT, DPT 10/24/231:06 PM   PHYSICAL THERAPY DISCHARGE SUMMARY  Visits from Start of Care: 1  Current functional level related to goals / functional outcomes: Unable to formally reassess as pt called to request DC from PT at this time   Remaining deficits: Unable to assess   Education / Equipment: HEP   Patient agrees to discharge. Patient goals were not met. Patient is being discharged due to the patient's  request.  Stacy Gardner, PT, DPT 11/24/2309:34 AM

## 2021-11-28 ENCOUNTER — Ambulatory Visit: Payer: Medicare Other | Admitting: Physical Therapy

## 2021-11-29 ENCOUNTER — Encounter: Payer: Self-pay | Admitting: Internal Medicine

## 2021-11-29 ENCOUNTER — Ambulatory Visit (INDEPENDENT_AMBULATORY_CARE_PROVIDER_SITE_OTHER): Payer: Medicare Other | Admitting: Internal Medicine

## 2021-11-29 VITALS — BP 130/80 | HR 67 | Ht 61.0 in | Wt 161.4 lb

## 2021-11-29 DIAGNOSIS — K649 Unspecified hemorrhoids: Secondary | ICD-10-CM | POA: Diagnosis not present

## 2021-11-29 NOTE — Patient Instructions (Addendum)
_______________________________________________________  If you are age 69 or older, your body mass index should be between 23-30. Your Body mass index is 30.5 kg/m. If this is out of the aforementioned range listed, please consider follow up with your Primary Care Provider. ________________________________________________________  The [facility] providers would like to encourage you to use [facility] to communicate with providers for non-urgent requests or questions.  Due to long hold times on the telephone, sending your provider a message by [facility] may be a faster and more efficient way to get a response.  Please allow 48 business hours for a response.  Please remember that this is for non-urgent requests.  _______________________________________________________  [facility] Jew PROCEDURE    FOLLOW-UP CARE   The procedure you have had should have been relatively painless since the banding of the area involved does not have nerve endings and there is no pain sensation.  The rubber band cuts off the blood supply to the hemorrhoid and the band may fall off as soon as 48 hours after the banding (the band may occasionally be seen in the toilet bowl following a bowel movement). You may notice a temporary feeling of fullness in the rectum which should respond adequately to plain Tylenol or Motrin.  Following the banding, avoid strenuous exercise that evening and resume full activity the next day.  A sitz bath (soaking in a warm tub) or bidet is soothing, and can be useful for cleansing the area after bowel movements.     To avoid constipation, take two tablespoons of natural wheat bran, natural oat bran, flax, Benefiber or any over the counter fiber supplement and increase your water intake to 7-8 glasses daily.    Unless you have been prescribed anorectal medication, do not put anything inside your rectum for two weeks: No suppositories, enemas, fingers, etc.  Occasionally, you may have more  bleeding than usual after the banding procedure.  This is often from the untreated hemorrhoids rather than the treated one.  Don't be concerned if there is a tablespoon or so of blood.  If there is more blood than this, lie flat with your bottom higher than your head and apply an ice pack to the area. If the bleeding does not stop within a half an hour or if you feel faint, call our office at [phone] or go to the emergency room.  Problems are not common; however, if there is a substantial amount of bleeding, severe pain, chills, fever or difficulty passing urine (very rare) or other problems, you should call us at [phone] or report to the nearest emergency room.  Do not stay seated continuously for more than 2-3 hours for a day or two after the procedure.  Tighten your buttock muscles 10-15 times every two hours and take 10-15 deep breaths every 1-2 hours.  Do not spend more than a few minutes on the toilet if you cannot empty your bowel; instead re-visit the toilet at a later time.   You are scheduled to have 3rd hemorrhoid banding on 12-29-21 at 130pm.  Thank you for entrusting me with your care and choosing [facility].  Dr Lorenso Courier

## 2021-11-29 NOTE — Progress Notes (Signed)
PROCEDURE NOTE: The patient presents with symptomatic grade 2  hemorrhoids, requesting rubber band ligation of his/her hemorrhoidal disease.  All risks, benefits and alternative forms of therapy were described and informed consent was obtained.  In the Left Lateral Decubitus position the decision was made to band the right anterior internal hemorrhoid, and the [place] was used to perform band ligation without complication.  Digital anorectal examination was then performed to assure proper positioning of the band, and to adjust the banded tissue as required.  The patient was discharged home without pain or other issues.  Dietary and behavioral recommendations were given and along with follow-up instructions.     The following adjunctive treatments were recommended: Patient is currently undergoing pelvic floor PT  The patient will return in 2-4 weeks for  follow-up and possible additional banding as required. No complications were encountered and the patient tolerated the procedure well.

## 2021-12-14 ENCOUNTER — Ambulatory Visit: Payer: Medicare Other

## 2021-12-15 ENCOUNTER — Other Ambulatory Visit: Payer: Self-pay

## 2021-12-15 ENCOUNTER — Encounter: Payer: Medicare Other | Admitting: Physical Therapy

## 2021-12-15 ENCOUNTER — Encounter: Payer: Self-pay | Admitting: Family Medicine

## 2021-12-15 DIAGNOSIS — G8929 Other chronic pain: Secondary | ICD-10-CM

## 2021-12-16 ENCOUNTER — Ambulatory Visit
Admission: RE | Admit: 2021-12-16 | Discharge: 2021-12-16 | Disposition: A | Discharge status: Home / Self Care | Payer: Medicare Other | Source: Ambulatory Visit | Attending: Family Medicine | Admitting: Family Medicine

## 2021-12-16 DIAGNOSIS — Z1231 Encounter for screening mammogram for malignant neoplasm of breast: Secondary | ICD-10-CM

## 2021-12-20 NOTE — Progress Notes (Signed)
Normal mammo, follow up yearly.

## 2021-12-28 ENCOUNTER — Encounter: Payer: Self-pay | Admitting: Family Medicine

## 2021-12-28 ENCOUNTER — Encounter: Payer: Medicare Other | Admitting: Physical Therapy

## 2021-12-28 DIAGNOSIS — J3089 Other allergic rhinitis: Secondary | ICD-10-CM

## 2021-12-29 ENCOUNTER — Encounter: Payer: Medicare Other | Admitting: Internal Medicine

## 2021-12-29 MED ORDER — FLUTICASONE PROPIONATE 50 MCG/ACT NA SUSP: 2.0000 | Freq: Every day | NASAL | 3 refills | Status: AC

## 2021-12-30 NOTE — Progress Notes (Deleted)
Office Visit Note  Patient: Renee Bauer             Date of Birth: 17-Feb-1950           MRN: [id number]             PCP: Farrel Conners, MD Referring: Caren Macadam, MD Visit Date: 01/12/2022 Occupation: '@GUAROCC'$ @  Subjective:  No chief complaint on file.   History of Present Illness: Renee Bauer is a 71 y.o. female ***   Activities of Daily Living:  Patient reports morning stiffness for *** {minute/hour:19697}.   Patient {ACTIONS;DENIES/REPORTS:21021675::"Denies"} nocturnal pain.  Difficulty dressing/grooming: {ACTIONS;DENIES/REPORTS:21021675::"Denies"} Difficulty climbing stairs: {ACTIONS;DENIES/REPORTS:21021675::"Denies"} Difficulty getting out of chair: {ACTIONS;DENIES/REPORTS:21021675::"Denies"} Difficulty using hands for taps, buttons, cutlery, and/or writing: {ACTIONS;DENIES/REPORTS:21021675::"Denies"}  No Rheumatology ROS completed.   PMFS History:  Patient Active Problem List   Diagnosis Date Noted   GERD without esophagitis 09/11/2021   Chronic knee pain 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 of left knee joint 07/06/2021   Greater trochanteric bursitis of both hips 05/23/2021   Primary osteoarthritis of both hands 07/06/2019   Primary osteoarthritis of both knees 07/06/2019   Unilateral primary osteoarthritis, left knee 10/31/2018   Arthritis 10/30/2018   DM (diabetes mellitus) type II controlled, neurological manifestation ([place]) 10/30/2018   Neuropathy of both feet 10/30/2018   Anxiety 10/30/2018   Insomnia 10/30/2018   History of skin cancer 10/30/2018   Osteopenia 09/30/2018    Past Medical History:  Diagnosis Date   Anxiety    Arthritis    Benign colon polyp 2018   Bursitis    Depression    Diabetes mellitus without complication (HCC)    GERD (gastroesophageal reflux disease)    Hyperlipidemia    Rosacea     Family History  Problem Relation Age of Onset   Diabetes Mother    Arrhythmia Mother    High blood pressure Mother    Mesothelioma  Father        asbestos exposure   Allergic rhinitis Father    Sinusitis Father    Healthy Sister    Post-traumatic stress disorder Son    Scoliosis Son    Brain cancer Maternal Aunt    Asthma Neg Hx    Eczema Neg Hx    Urticaria Neg Hx    Angioedema Neg Hx    Immunodeficiency Neg Hx    Atopy Neg Hx    Colon cancer Neg Hx    Esophageal cancer Neg Hx    Stomach cancer Neg Hx    Colon polyps Neg Hx    Past Surgical History:  Procedure Laterality Date   ABDOMINAL HYSTERECTOMY     menorrhagia   BLADDER REPAIR     BREAST BIOPSY Left    BUNIONECTOMY Right    COLON RESECTION  2007   COLONOSCOPY     DILATION AND CURETTAGE OF UTERUS     TOTAL KNEE ARTHROPLASTY Left 09/29/2020   Social History   Social History Narrative   Not on file   Immunization History  Administered Date(s) Administered   Fluad Quad(high Dose 65+) 11/04/2018   Moderna Sars-Covid-2 Vaccination 06/10/2019, 07/06/2019   Pneumococcal Conjugate-13 11/04/2018   Pneumococcal Polysaccharide-23 06/10/2020   Td 07/19/2016   Zoster Recombinat (Shingrix) 06/10/2020     Objective: Vital Signs: There were no vitals taken for this visit.   Physical Exam   Musculoskeletal Exam: ***  CDAI Exam: CDAI Score: -- Patient Global: --; Provider Global: -- Swollen: --; Tender: -- Joint Exam 01/12/2022  No joint exam has been documented for this visit   There is currently no information documented on the homunculus. Go to the Rheumatology activity and complete the homunculus joint exam.  Investigation: No additional findings.  Imaging: MM 3D SCREEN BREAST BILATERAL  Result Date: 12/19/2021 CLINICAL DATA:  Screening. EXAM: DIGITAL SCREENING BILATERAL MAMMOGRAM WITH TOMOSYNTHESIS AND CAD TECHNIQUE: Bilateral screening digital craniocaudal and mediolateral oblique mammograms were obtained. Bilateral screening digital breast tomosynthesis was performed. The images were evaluated with computer-aided detection.  COMPARISON:  Previous exam(s). ACR Breast Density Category b: There are scattered areas of fibroglandular density. FINDINGS: There are no findings suspicious for malignancy. IMPRESSION: No mammographic evidence of malignancy. A result letter of this screening mammogram will be mailed directly to the patient. RECOMMENDATION: Screening mammogram in one year. (Code:SM-B-01Y) BI-RADS CATEGORY  1: Negative. Electronically Signed   By: Marin Olp M.D.   On: 12/19/2021 16:31    Recent Labs: Lab Results  Component Value Date   WBC 7.0 05/18/2021   HGB 14.2 05/18/2021   PLT 216.0 05/18/2021   NA 140 05/18/2021   K 3.6 05/18/2021   CL 103 05/18/2021   CO2 28 05/18/2021   GLUCOSE 89 05/18/2021   BUN 15 05/18/2021   CREATININE 0.86 05/18/2021   BILITOT 0.5 05/18/2021   ALKPHOS 78 05/18/2021   AST 18 05/18/2021   ALT 12 05/18/2021   PROT 7.2 05/18/2021   ALBUMIN 4.4 05/18/2021   CALCIUM 10.2 07/06/2021    Speciality Comments: No specialty comments available.  Procedures:  No procedures performed Allergies: Tramadol, Wellbutrin [bupropion], and Clindamycin hcl   Assessment / Plan:     Visit Diagnoses: No diagnosis found.  Orders: No orders of the defined types were placed in this encounter.  No orders of the defined types were placed in this encounter.   Face-to-face time spent with patient was *** minutes. Greater than 50% of time was spent in counseling and coordination of care.  Follow-Up Instructions: No follow-ups on file.   Earnestine Mealing, CMA  Note - This record has been created using Editor, commissioning.  Chart creation errors have been sought, but may not always  have been located. Such creation errors do not reflect on  the standard of medical care.

## 2022-01-04 ENCOUNTER — Encounter: Payer: Medicare Other | Admitting: Physical Therapy

## 2022-01-05 ENCOUNTER — Encounter: Payer: Medicare Other | Admitting: Internal Medicine

## 2022-01-10 ENCOUNTER — Encounter: Payer: Self-pay | Admitting: Family Medicine

## 2022-01-10 ENCOUNTER — Ambulatory Visit (INDEPENDENT_AMBULATORY_CARE_PROVIDER_SITE_OTHER): Payer: Medicare Other | Admitting: Family Medicine

## 2022-01-10 VITALS — BP 102/76 | HR 72 | Temp 98.2°F | Ht 61.0 in | Wt 164.8 lb

## 2022-01-10 DIAGNOSIS — L659 Nonscarring hair loss, unspecified: Secondary | ICD-10-CM | POA: Diagnosis not present

## 2022-01-10 DIAGNOSIS — J3089 Other allergic rhinitis: Secondary | ICD-10-CM | POA: Diagnosis not present

## 2022-01-10 DIAGNOSIS — F419 Anxiety disorder, unspecified: Secondary | ICD-10-CM | POA: Diagnosis not present

## 2022-01-10 MED ORDER — MONTELUKAST SODIUM 10 MG PO TABS: 10.0000 mg | ORAL_TABLET | Freq: Every day | ORAL | 3 refills | Status: AC

## 2022-01-10 MED ORDER — DESVENLAFAXINE SUCCINATE ER 25 MG PO TB24: 25.0000 mg | ORAL_TABLET | Freq: Every day | ORAL | 2 refills | Status: DC

## 2022-01-10 NOTE — Progress Notes (Unsigned)
Established Patient Office Visit  Subjective   Patient ID: Renee Bauer, female    DOB: 03-May-1950  Age: 71 y.o. MRN: [id number]  Chief Complaint  Patient presents with   Hair/Scalp Problem    Patient complains of hair loss x4 months   Anxiety    recurrent   Depression    recurrent    Patient reports that in the shower she is having more and more lair loss with each shower. She has noticed that her hair has been thinning, not as much of it. No itching, no flaking, no specific bald areas are coming up.   Anxiety Symptoms include insomnia.    Depression        This is a recurrent problem.  The problem occurs intermittently.  The most recent episode lasted 1 month.    The problem has been waxing and waning since onset.  Associated symptoms include fatigue, insomnia and sad.     The symptoms are aggravated by family issues.  Past treatments include SSRIs - Selective serotonin reuptake inhibitors.  Past medical history includes anxiety.    Current Outpatient Medications  Medication Instructions   acyclovir (ZOVIRAX) 400 mg, Oral, 3 times daily   ALPRAZolam (XANAX) 1 MG tablet TAKE 1 TABLET AS NEEDED FOR ANXIETY   CALCIUM CITRATE PO 600 mg, Oral, Daily   Cholecalciferol (VITAMIN D3 PO) 2,000 Units, Oral, Daily   cyanocobalamin (VITAMIN B12) 1,000 mcg, Oral, Daily   desvenlafaxine (PRISTIQ) 25 mg, Oral, Daily   esomeprazole (NEXIUM) 40 mg, Oral, Daily   fluticasone (FLONASE) 50 MCG/ACT nasal spray 2 sprays, Each Nare, Daily   gabapentin (NEURONTIN) 100 MG capsule TAKE 1 CAPSULE THREE TIMES A DAY   hydrocortisone (ANUSOL-HC) 2.5 % rectal cream 1 Application, Rectal, 2 times daily   lidocaine-prilocaine (EMLA) cream 1 application , Topical, As needed   meloxicam (MOBIC) 7.5 mg, Oral, Daily   METRONIDAZOLE, TOPICAL, 0.75 % LOTN As needed   montelukast (SINGULAIR) 10 mg, Oral, Daily at bedtime   OVER THE COUNTER MEDICATION Zeasorb powder   polyethylene glycol (MIRALAX) 17 g, Oral,  Daily   rosuvastatin (CRESTOR) 10 mg, Oral, Daily at bedtime   TERBINAFINE EX Apply externally   zolpidem (AMBIEN CR) 6.25 mg, Oral, At bedtime PRN     Patient Active Problem List   Diagnosis Date Noted   GERD without esophagitis 09/11/2021   Chronic knee pain 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 of left knee joint 07/06/2021   Greater trochanteric bursitis of both hips 05/23/2021   Primary osteoarthritis of both hands 07/06/2019   Primary osteoarthritis of both knees 07/06/2019   Unilateral primary osteoarthritis, left knee 10/31/2018   Arthritis 10/30/2018   DM (diabetes mellitus) type II controlled, neurological manifestation ([place]) 10/30/2018   Neuropathy of both feet 10/30/2018   Anxiety 10/30/2018   Insomnia 10/30/2018   History of skin cancer 10/30/2018   Osteopenia 09/30/2018      Review of Systems  Constitutional:  Positive for fatigue.  Psychiatric/Behavioral:  Positive for depression. The patient has insomnia.   All other systems reviewed and are negative.     Objective:     BP 102/76 (BP Location: Left Arm, Patient Position: Sitting, Cuff Size: Normal)   Pulse 72   Temp 98.2 F (36.8 C) (Oral)   Ht '5\' 1"'$  (1.549 m)   Wt 164 lb 12.8 oz (74.8 kg)   SpO2 97%   BMI 31.14 kg/m  {Vitals History (Optional):23777}  Physical Exam Vitals reviewed.  Constitutional:  Appearance: Normal appearance. She is well-groomed and normal weight.  Eyes:     Conjunctiva/sclera: Conjunctivae normal.  Neck:     Thyroid: No thyromegaly.  Cardiovascular:     Rate and Rhythm: Normal rate and regular rhythm.     Pulses: Normal pulses.     Heart sounds: S1 normal and S2 normal.  Pulmonary:     Effort: Pulmonary effort is normal.     Breath sounds: Normal breath sounds and air entry.  Abdominal:     General: Bowel sounds are normal.  Musculoskeletal:     Right lower leg: No edema.     Left lower leg: No edema.  Neurological:     Mental Status: She is alert and oriented to person,  place, and time. Mental status is at baseline.     Gait: Gait is intact.  Psychiatric:        Mood and Affect: Mood and affect normal.        Speech: Speech normal.        Behavior: Behavior normal.        Judgment: Judgment normal.      No results found for any visits on 01/10/22.  {Labs (Optional):23779}  The 10-year ASCVD risk score (Arnett DK, et al., 2019) is: 8.9%    Assessment & Plan:   Problem List Items Addressed This Visit       Unprioritized   Anxiety   Relevant Medications   desvenlafaxine (PRISTIQ) 25 MG 24 hr tablet   Other Visit Diagnoses     Hair loss    -  Primary   Relevant Orders   TSH   Non-seasonal allergic rhinitis due to other allergic trigger       Relevant Medications   montelukast (SINGULAIR) 10 MG tablet       No follow-ups on file.    Farrel Conners, MD

## 2022-01-10 NOTE — Patient Instructions (Signed)
Multivitamin with higher B complex vitamins  Hair skin and nails supplement (biotin)   Or increasing protein in the diet.

## 2022-01-11 LAB — TSH: TSH: 1.41 u[IU]/mL (ref 0.35–5.50)

## 2022-01-11 NOTE — Assessment & Plan Note (Addendum)
With depression symptoms, I advised we try a small dose of pristiq 25 mg daily to start to help with symptom management. She has a follow up appt with me already in February-- I will have her keep this appointment so we can re-evaluated her depression symptoms at that time.

## 2022-01-12 ENCOUNTER — Ambulatory Visit: Payer: Medicare Other | Admitting: Rheumatology

## 2022-01-12 DIAGNOSIS — M19042 Primary osteoarthritis, left hand: Secondary | ICD-10-CM

## 2022-01-12 DIAGNOSIS — M19071 Primary osteoarthritis, right ankle and foot: Secondary | ICD-10-CM

## 2022-01-12 DIAGNOSIS — G8929 Other chronic pain: Secondary | ICD-10-CM

## 2022-01-12 DIAGNOSIS — F419 Anxiety disorder, unspecified: Secondary | ICD-10-CM

## 2022-01-12 DIAGNOSIS — M8589 Other specified disorders of bone density and structure, multiple sites: Secondary | ICD-10-CM

## 2022-01-12 DIAGNOSIS — M1711 Unilateral primary osteoarthritis, right knee: Secondary | ICD-10-CM

## 2022-01-12 DIAGNOSIS — Z8639 Personal history of other endocrine, nutritional and metabolic disease: Secondary | ICD-10-CM

## 2022-01-12 DIAGNOSIS — G4709 Other insomnia: Secondary | ICD-10-CM

## 2022-01-12 DIAGNOSIS — G5793 Unspecified mononeuropathy of bilateral lower limbs: Secondary | ICD-10-CM

## 2022-01-12 DIAGNOSIS — Z96652 Presence of left artificial knee joint: Secondary | ICD-10-CM

## 2022-01-12 DIAGNOSIS — Z86010 Personal history of colonic polyps: Secondary | ICD-10-CM

## 2022-01-12 DIAGNOSIS — E1142 Type 2 diabetes mellitus with diabetic polyneuropathy: Secondary | ICD-10-CM

## 2022-01-12 DIAGNOSIS — Z85828 Personal history of other malignant neoplasm of skin: Secondary | ICD-10-CM

## 2022-01-12 DIAGNOSIS — Z8719 Personal history of other diseases of the digestive system: Secondary | ICD-10-CM

## 2022-01-12 DIAGNOSIS — M5136 Other intervertebral disc degeneration, lumbar region: Secondary | ICD-10-CM

## 2022-01-12 DIAGNOSIS — M7062 Trochanteric bursitis, left hip: Secondary | ICD-10-CM

## 2022-01-17 ENCOUNTER — Encounter: Payer: Self-pay | Admitting: Family Medicine

## 2022-01-19 ENCOUNTER — Other Ambulatory Visit: Payer: Self-pay | Admitting: Family

## 2022-01-19 DIAGNOSIS — E1169 Type 2 diabetes mellitus with other specified complication: Secondary | ICD-10-CM

## 2022-01-27 ENCOUNTER — Ambulatory Visit: Payer: Medicare Other | Admitting: Podiatry

## 2022-02-01 ENCOUNTER — Ambulatory Visit (INDEPENDENT_AMBULATORY_CARE_PROVIDER_SITE_OTHER): Payer: Medicare Other | Admitting: Podiatry

## 2022-02-01 VITALS — BP 130/78

## 2022-02-01 DIAGNOSIS — B351 Tinea unguium: Secondary | ICD-10-CM

## 2022-02-01 DIAGNOSIS — Q666 Other congenital valgus deformities of feet: Secondary | ICD-10-CM | POA: Diagnosis not present

## 2022-02-01 DIAGNOSIS — E1142 Type 2 diabetes mellitus with diabetic polyneuropathy: Secondary | ICD-10-CM

## 2022-02-01 DIAGNOSIS — M79675 Pain in left toe(s): Secondary | ICD-10-CM

## 2022-02-01 DIAGNOSIS — M79674 Pain in right toe(s): Secondary | ICD-10-CM | POA: Diagnosis not present

## 2022-02-01 NOTE — Progress Notes (Signed)
[place] Phone: [phone] Subjective:   [name], am serving as a scribe for Dr. Hulan Saas.  I'm seeing this patient by the request  of:  Farrel Conners, MD  CC: Left knee pain, bilateral hip pain  EVO:[id number]  11/10/2021 Patient on ultrasound still has unfortunately hypoechoic changes of the patellofemoral area.  No notable increase in Doppler flow that is concerning for any type of infectious etiology.  We discussed with patient having some of the skin changes that this could either be secondary to the corticosteroid injection with some fat atrophy causing some more fragility of the capillaries or the possibility as this could be a smoldering shingles breakout if patient does truly have it where it is a weeping aspect.  We will try acyclovir 3 times a day for 5 days, continue the gabapentin.  We discussed with patient that if continuing to have pain in 6 to 9 months we do need to consider another bone scan or a low-dose MRI with metal protocol.  Follow-up with me again in 3 months otherwise.  Total time reviewing outside records, imaging and discussing with patient 33 minutes    Update 02/08/2022 TIANA SIVERTSON is a 72 y.o. female coming in with complaint of L knee pain. Patient states that she will be seeing Dr. Maureen Ralphs on Feb 22nd. Pain is worsening over superior and inferior patella. Not using anything for pain.   B hip pain is increasing. Hoping to get injections in [place].        Past Medical History:  Diagnosis Date   Anxiety    Arthritis    Benign colon polyp 2018   Bursitis    Depression    Diabetes mellitus without complication (HCC)    GERD (gastroesophageal reflux disease)    Hyperlipidemia    Rosacea    Past Surgical History:  Procedure Laterality Date   ABDOMINAL HYSTERECTOMY     menorrhagia   BLADDER REPAIR     BREAST BIOPSY Left    BUNIONECTOMY Right    COLON RESECTION   2007   COLONOSCOPY     DILATION AND CURETTAGE OF UTERUS     TOTAL KNEE ARTHROPLASTY Left 09/29/2020   Social History   Socioeconomic History   Marital status: Married    Spouse name: Not on file   Number of children: 2   Years of education: Not on file   Highest education level: Associate degree: occupational, Hotel manager, or vocational program  Occupational History   Occupation: Retired  Tobacco Use   Smoking status: Never   Smokeless tobacco: Never  Vaping Use   Vaping Use: Never used  Substance and Sexual Activity   Alcohol use: Yes    Comment: occasional wine   Drug use: Never   Sexual activity: Not on file  Other Topics Concern   Not on file  Social History Narrative   Not on file   Social Determinants of Health   Financial Resource Strain: [place]  (01/09/2022)   Overall Financial Resource Strain (CARDIA)    Difficulty of Paying Living Expenses: Not hard at all  Food Insecurity: No Food Insecurity (01/09/2022)   Hunger Vital Sign    Worried About Running Out of Food in the Last Year: Never true    [place] in the Last Year: Never true  Transportation Needs: No Transportation Needs (01/09/2022)   PRAPARE - Transportation    Lack  of Transportation (Medical): No    Lack of Transportation (Non-Medical): No  Physical Activity: Sufficiently Active (01/09/2022)   Exercise Vital Sign    Days of Exercise per Week: 4 days    Minutes of Exercise per Session: 60 min  Stress: Stress Concern Present (01/09/2022)   [place]    Feeling of Stress : Rather much  Social Connections: Socially Integrated (01/09/2022)   Social Connection and Isolation Panel [NHANES]    Frequency of Communication with Friends and Family: Once a week    Frequency of Social Gatherings with Friends and Family: Twice a week    Attends Religious Services: More than 4 times per year    Active Member of [employer] or Organizations:  Yes    Attends Music therapist: More than 4 times per year    Marital Status: Married   Allergies  Allergen Reactions   Tramadol     Felt sick with this   Wellbutrin [Bupropion]     Muscle twitches   Clindamycin Hcl Rash   Family History  Problem Relation Age of Onset   Diabetes Mother    Arrhythmia Mother    High blood pressure Mother    Mesothelioma Father        asbestos exposure   Allergic rhinitis Father    Sinusitis Father    Healthy Sister    Post-traumatic stress disorder Son    Scoliosis Son    Brain cancer Maternal Aunt    Asthma Neg Hx    Eczema Neg Hx    Urticaria Neg Hx    Angioedema Neg Hx    Immunodeficiency Neg Hx    Atopy Neg Hx    Colon cancer Neg Hx    Esophageal cancer Neg Hx    Stomach cancer Neg Hx    Colon polyps Neg Hx      Current Outpatient Medications (Cardiovascular):    rosuvastatin (CRESTOR) 10 MG tablet, Take 1 tablet (10 mg total) by mouth at bedtime.  Current Outpatient Medications (Respiratory):    fluticasone (FLONASE) 50 MCG/ACT nasal spray, Place 2 sprays into both nostrils daily.   montelukast (SINGULAIR) 10 MG tablet, Take 1 tablet (10 mg total) by mouth at bedtime.  Current Outpatient Medications (Analgesics):    meloxicam (MOBIC) 7.5 MG tablet, Take 1 tablet (7.5 mg total) by mouth daily.  Current Outpatient Medications (Hematological):    vitamin B-12 (CYANOCOBALAMIN) 1000 MCG tablet, Take 1,000 mcg by mouth daily.  Current Outpatient Medications (Other):    acyclovir (ZOVIRAX) 400 MG tablet, Take 1 tablet (400 mg total) by mouth 3 (three) times daily.   ALPRAZolam (XANAX) 1 MG tablet, TAKE 1 TABLET AS NEEDED FOR ANXIETY   CALCIUM CITRATE PO, Take 600 mg by mouth daily.   Cholecalciferol (VITAMIN D3 PO), Take 2,000 Units by mouth daily.   desvenlafaxine (PRISTIQ) 25 MG 24 hr tablet, Take 1 tablet (25 mg total) by mouth daily.   esomeprazole (NEXIUM) 40 MG capsule, Take 1 capsule (40 mg total) by mouth  daily.   gabapentin (NEURONTIN) 100 MG capsule, TAKE 1 CAPSULE THREE TIMES A DAY   hydrocortisone (ANUSOL-HC) 2.5 % rectal cream, Place 1 Application rectally 2 (two) times daily.   lidocaine-prilocaine (EMLA) cream, APPLY 1 APPLICATION TOPICALLY AS NEEDED   METRONIDAZOLE, TOPICAL, 0.75 % LOTN, as needed.    OVER THE COUNTER MEDICATION, Zeasorb powder   polyethylene glycol (MIRALAX) 17 g packet, Take 17 g by mouth daily.  TERBINAFINE EX, Apply topically.   zolpidem (AMBIEN CR) 6.25 MG CR tablet, Take 1 tablet (6.25 mg total) by mouth at bedtime as needed for sleep.   Reviewed prior external information including notes and imaging from  primary care provider As well as notes that were available from care everywhere and other healthcare systems.  Past medical history, social, surgical and family history all reviewed in electronic medical record.  No pertanent information unless stated regarding to the chief complaint.   Review of Systems:  No headache, visual changes, nausea, vomiting, diarrhea, constipation, dizziness, abdominal pain, skin rash, fevers, chills, night sweats, weight loss, swollen lymph nodes, body aches, joint swelling, chest pain, shortness of breath, mood changes. POSITIVE muscle aches  Objective  Blood pressure 120/82, pulse 93, height '5\' 1"'$  (1.549 m), weight 163 lb (73.9 kg), SpO2 98 %.   General: No apparent distress alert and oriented x3 mood and affect normal, dressed appropriately.  HEENT: Pupils equal, extraocular movements intact  Respiratory: Patient's speak in full sentences and does not appear short of breath  Cardiovascular: No lower extremity edema, non tender, no erythema  Bilateral hips do have some tenderness noted today.  Seems to be over the greater trochanteric areas.  Tightness with FABER test.  Antalgic gait noted   Procedure: Real-time Ultrasound Guided Injection of right greater trochanteric bursitis secondary to patient's body habitus Device:  GE Logiq Q7 Ultrasound guided injection is preferred based studies that show increased duration, increased effect, greater accuracy, decreased procedural pain, increased response rate, and decreased cost with ultrasound guided versus blind injection.  Verbal informed consent obtained.  Time-out conducted.  Noted no overlying erythema, induration, or other signs of local infection.  Skin prepped in a sterile fashion.  Local anesthesia: Topical Ethyl chloride.  With sterile technique and under real time ultrasound guidance:  Greater trochanteric area was visualized and patient's bursa was noted. A 22-gauge 3 inch needle was inserted and 4 cc of 0.5% Marcaine and 1 cc of Kenalog 40 mg/dL was injected. Pictures taken Completed without difficulty  Pain immediately resolved suggesting accurate placement of the medication.  Advised to call if fevers/chills, erythema, induration, drainage, or persistent bleeding.  Impression: Technically successful ultrasound guided injection.   Procedure: Real-time Ultrasound Guided Injection of left  greater trochanteric bursitis secondary to patient's body habitus Device: GE Logiq Q7  Ultrasound guided injection is preferred based studies that show increased duration, increased effect, greater accuracy, decreased procedural pain, increased response rate, and decreased cost with ultrasound guided versus blind injection.  Verbal informed consent obtained.  Time-out conducted.  Noted no overlying erythema, induration, or other signs of local infection.  Skin prepped in a sterile fashion.  Local anesthesia: Topical Ethyl chloride.  With sterile technique and under real time ultrasound guidance:  Greater trochanteric area was visualized and patient's bursa was noted. A 22-gauge 3 inch needle was inserted and 4 cc of 0.5% Marcaine and 1 cc of Kenalog 40 mg/dL was injected. Pictures taken Completed without difficulty  Pain immediately resolved suggesting accurate  placement of the medication.  Advised to call if fevers/chills, erythema, induration, drainage, or persistent bleeding.  Impression: Technically successful ultrasound guided injection.    Impression and Recommendations:    The above documentation has been reviewed and is accurate and complete Lyndal Pulley, DO

## 2022-02-01 NOTE — Progress Notes (Signed)
  Subjective:  Patient ID: Renee Bauer, female    DOB: 09/04/1950,  MRN: [id number]  Chief Complaint  Patient presents with   Nail Problem    Nail trim   72 y.o. female returns for the above complaint.  Patient presents with thickened elongated dystrophic toenails x10.  Pain full to touch.  She is a diabetic with last A1c of 5.7.  She would like to have her nails debrided down.  She denies any other acute complaints.  Objective:   Vitals:   02/01/22 1014  BP: 130/78   Podiatric Exam: Vascular: dorsalis pedis and posterior tibial pulses are palpable bilateral. Capillary return is immediate. Temperature gradient is WNL. Skin turgor WNL  Sensorium: Normal Semmes Weinstein monofilament test. Normal tactile sensation bilaterally. Nail Exam: Pt has thick disfigured discolored nails with subungual debris noted bilateral entire nail hallux through fifth toenails.  Except left second digit due to previous total nail avulsion.  Pain on palpation to the nails. Ulcer Exam: There is no evidence of ulcer or pre-ulcerative changes or infection. Orthopedic Exam: Muscle tone and strength are WNL. No limitations in general ROM. No crepitus or effusions noted. HAV  B/L.  Hammer toes 2-5  B/L. Skin: No Porokeratosis. No infection or ulcers    Assessment & Plan:   1. Pes planovalgus   2. Pain due to onychomycosis of toenails of both feet   3. Controlled type 2 diabetes mellitus with diabetic polyneuropathy, without long-term current use of insulin (HCC)     Pes planovalgus -I explained to patient the etiology of pes planovalgus and relationship with Planter fasciitis and various treatment options were discussed.  Given patient foot structure in the setting of Planter fasciitis I believe patient will benefit from custom-made orthotics to help control the hindfoot motion support the arch of the foot and take the stress away from plantar fascial.  Patient agrees with the plan like to proceed with  orthotics -Patient was casted for orthotics with metatarsal pad/Morton's extension    Patient was evaluated and treated and all questions answered.  Onychomycosis with pain  -Nails palliatively debrided as below. -Educated on self-care  Procedure: Nail Debridement Rationale: pain  Type of Debridement: manual, sharp debridement. Instrumentation: Nail nipper, rotary burr. Number of Nails: 9  Procedures and Treatment: Consent by patient was obtained for treatment procedures. The patient understood the discussion of treatment and procedures well. All questions were answered thoroughly reviewed. Debridement of mycotic and hypertrophic toenails, 1 through 5 bilateral and clearing of subungual debris. No ulceration, no infection noted.  Return Visit-Office Procedure: Patient instructed to return to the office for a follow up visit 3 months for continued evaluation and treatment.  Boneta Lucks, DPM    No follow-ups on file.

## 2022-02-08 ENCOUNTER — Ambulatory Visit (INDEPENDENT_AMBULATORY_CARE_PROVIDER_SITE_OTHER): Payer: Medicare Other | Admitting: Family Medicine

## 2022-02-08 ENCOUNTER — Encounter: Payer: Self-pay | Admitting: Family Medicine

## 2022-02-08 ENCOUNTER — Ambulatory Visit: Payer: Self-pay

## 2022-02-08 VITALS — BP 120/82 | HR 93 | Ht 61.0 in | Wt 163.0 lb

## 2022-02-08 DIAGNOSIS — G8929 Other chronic pain: Secondary | ICD-10-CM | POA: Diagnosis not present

## 2022-02-08 DIAGNOSIS — M7061 Trochanteric bursitis, right hip: Secondary | ICD-10-CM | POA: Diagnosis not present

## 2022-02-08 DIAGNOSIS — M7062 Trochanteric bursitis, left hip: Secondary | ICD-10-CM

## 2022-02-08 DIAGNOSIS — M25562 Pain in left knee: Secondary | ICD-10-CM

## 2022-02-08 DIAGNOSIS — E1169 Type 2 diabetes mellitus with other specified complication: Secondary | ICD-10-CM

## 2022-02-08 NOTE — Assessment & Plan Note (Signed)
Chronic, that is likely contributing to more of how patient is antalgic gait from her knee pain.  Last seen patient orthopedic surgeon in the near future so hopefully that is going to be okay.  Follow-up with me again in 6 to 8 weeks.  Continue to work on hip abductor strengthening otherwise.

## 2022-02-08 NOTE — Patient Instructions (Addendum)
Injected both hips See me again in 3 months

## 2022-02-09 MED ORDER — ROSUVASTATIN CALCIUM 10 MG PO TABS: 10.0000 mg | ORAL_TABLET | Freq: Every day | ORAL | 1 refills | Status: DC

## 2022-02-09 NOTE — Telephone Encounter (Signed)
Ok to send rx

## 2022-02-28 ENCOUNTER — Encounter: Payer: Self-pay | Admitting: Family Medicine

## 2022-02-28 MED ORDER — ZOLPIDEM TARTRATE ER 6.25 MG PO TBCR: 6.2500 mg | EXTENDED_RELEASE_TABLET | Freq: Every evening | ORAL | 1 refills | Status: DC | PRN

## 2022-03-07 ENCOUNTER — Telehealth: Payer: Self-pay | Admitting: Podiatry

## 2022-03-07 NOTE — Telephone Encounter (Signed)
Patient is going to call back to schedule picking up her orthotics .,  \\\  Balance is 490.00

## 2022-03-08 ENCOUNTER — Other Ambulatory Visit: Payer: Self-pay | Admitting: Family Medicine

## 2022-03-08 ENCOUNTER — Encounter: Payer: Self-pay | Admitting: Internal Medicine

## 2022-03-08 ENCOUNTER — Ambulatory Visit (INDEPENDENT_AMBULATORY_CARE_PROVIDER_SITE_OTHER): Payer: Medicare Other | Admitting: Internal Medicine

## 2022-03-08 VITALS — BP 118/74 | HR 71 | Ht 61.0 in | Wt 160.0 lb

## 2022-03-08 DIAGNOSIS — K219 Gastro-esophageal reflux disease without esophagitis: Secondary | ICD-10-CM

## 2022-03-08 DIAGNOSIS — K649 Unspecified hemorrhoids: Secondary | ICD-10-CM | POA: Diagnosis not present

## 2022-03-08 DIAGNOSIS — K5900 Constipation, unspecified: Secondary | ICD-10-CM

## 2022-03-08 MED ORDER — HYDROCORTISONE (PERIANAL) 2.5 % EX CREA: 1.0000 | TOPICAL_CREAM | Freq: Two times a day (BID) | CUTANEOUS | 1 refills | Status: DC

## 2022-03-08 NOTE — Progress Notes (Signed)
PROCEDURE NOTE: The patient presents with symptomatic grade 2 hemorrhoids, requesting rubber band ligation of his/her hemorrhoidal disease.  All risks, benefits and alternative forms of therapy were described and informed consent was obtained.  In the Left Lateral Decubitus position anoscopic examination revealed grade 2 hemorrhoids. The anorectum was pre-medicated with nitroglycerin and Recticare The decision was made to band the left anterior and right anterior internal hemorrhoid, and the Orason was used to perform band ligation without complication.  Digital anorectal examination was then performed to assure proper positioning of the band, and to adjust the banded tissue as required.  The patient was discharged home without pain or other issues.  Dietary and behavioral recommendations were given and along with follow-up instructions.     The following adjunctive treatments were recommended: - Start Linzess 145 mcg QD - Anusol HC cream BID sent at the patient's request  The patient will return in 2 months for  follow-up and possible additional banding as required. No complications were encountered and the patient tolerated the procedure well.

## 2022-03-08 NOTE — Patient Instructions (Addendum)
You have been scheduled for an endoscopy and colonoscopy. Please follow the written instructions given to you at your visit today. Please pick up your prep supplies at the pharmacy within the next 1-3 days. If you use inhalers (even only as needed), please bring them with you on the day of your procedure.   We have given you samples of the following medication to take: Linzess - Take 1 capsule by mouth once daily.   We have sent the following medications to your pharmacy for you to pick up at your convenience: Anusol Cream    Follow up in 2 months on : 05/08/22 at 2:10 pm   _______________________________________________________  If your blood pressure at your visit was 140/90 or greater, please contact your primary care physician to follow up on this.  _______________________________________________________  If you are age 78 or older, your body mass index should be between 23-30. Your Body mass index is 30.23 kg/m. If this is out of the aforementioned range listed, please consider follow up with your Primary Care Provider.  If you are age 73 or younger, your body mass index should be between 19-25. Your Body mass index is 30.23 kg/m. If this is out of the aformentioned range listed, please consider follow up with your Primary Care Provider.   ________________________________________________________  The [facility] providers would like to encourage you to use [facility] to communicate with providers for non-urgent requests or questions.  Due to long hold times on the telephone, sending your provider a message by [facility] may be a faster and more efficient way to get a response.  Please allow 48 business hours for a response.  Please remember that this is for non-urgent requests.  _______________________________________________________  Thank you for choosing me and [facility].  Dr.Ying Lorenso Courier

## 2022-03-10 ENCOUNTER — Ambulatory Visit (INDEPENDENT_AMBULATORY_CARE_PROVIDER_SITE_OTHER): Payer: Medicare Other

## 2022-03-10 DIAGNOSIS — M2041 Other hammer toe(s) (acquired), right foot: Secondary | ICD-10-CM

## 2022-03-10 DIAGNOSIS — M2140 Flat foot [pes planus] (acquired), unspecified foot: Secondary | ICD-10-CM

## 2022-03-10 DIAGNOSIS — Q666 Other congenital valgus deformities of feet: Secondary | ICD-10-CM

## 2022-03-10 NOTE — Progress Notes (Signed)
Patient presents today to pick up custom molded foot orthotics recommended by Dr. Posey Pronto.   Orthotics were dispensed and fit was satisfactory. Reviewed instructions for break-in and wear. Written instructions given to patient.  Patient will follow up as needed.   [facility] - order # [id number]   Patient also left her old pair of orthotics to be refurbished.  Will contact patient for pick up.

## 2022-03-15 ENCOUNTER — Encounter: Payer: Self-pay | Admitting: Family Medicine

## 2022-03-15 ENCOUNTER — Ambulatory Visit (INDEPENDENT_AMBULATORY_CARE_PROVIDER_SITE_OTHER): Payer: Medicare Other | Admitting: Family Medicine

## 2022-03-15 VITALS — BP 120/78 | HR 90 | Temp 98.3°F | Ht 61.5 in | Wt 162.9 lb

## 2022-03-15 DIAGNOSIS — E1142 Type 2 diabetes mellitus with diabetic polyneuropathy: Secondary | ICD-10-CM

## 2022-03-15 DIAGNOSIS — M2550 Pain in unspecified joint: Secondary | ICD-10-CM

## 2022-03-15 DIAGNOSIS — F419 Anxiety disorder, unspecified: Secondary | ICD-10-CM

## 2022-03-15 MED ORDER — METHOCARBAMOL 500 MG PO TABS: 500.0000 mg | ORAL_TABLET | Freq: Three times a day (TID) | ORAL | 2 refills | Status: AC | PRN

## 2022-03-15 MED ORDER — MELOXICAM 7.5 MG PO TABS: 7.5000 mg | ORAL_TABLET | Freq: Every day | ORAL | 1 refills | Status: AC

## 2022-03-15 NOTE — Progress Notes (Unsigned)
Established Patient Office Visit  Subjective   Patient ID: Renee Bauer, female    DOB: 1950/08/10  Age: 72 y.o. MRN: [id number]  Chief Complaint  Patient presents with   Medical Management of Chronic Issues    Patient declines HgA1C testing since she is going to [facility] next week    Patinet is here for follow up. She reports that she developed "muscle twitching" with the pristiq so she stopped taking the medication. States that she feels "ok" for now, does not wish to pursue other medication options at this time.   Pt reports she is going to the [place] [facility] because she wants to lose weight and thinks they will be able to help her. States that a lot of her depressive symptoms are from her weight.   Left knee pain-- pt had complications after total knee replacement, is going to get a second opinion with Dr. Maureen Ralphs.     Current Outpatient Medications  Medication Instructions   ALPRAZolam (XANAX) 1 MG tablet TAKE 1 TABLET AS NEEDED FOR ANXIETY   CALCIUM CITRATE PO 600 mg, Oral, Daily   Cholecalciferol (VITAMIN D3 PO) 2,000 Units, Oral, Daily   cyanocobalamin (VITAMIN B12) 1,000 mcg, Oral, Daily   esomeprazole (NEXIUM) 40 mg, Oral, Daily   fluticasone (FLONASE) 50 MCG/ACT nasal spray 2 sprays, Each Nare, Daily   gabapentin (NEURONTIN) 100 MG capsule TAKE 1 CAPSULE THREE TIMES A DAY   hydrocortisone (ANUSOL-HC) 2.5 % rectal cream 1 Application, Rectal, 2 times daily   lidocaine-prilocaine (EMLA) cream 1 application , Topical, As needed   meloxicam (MOBIC) 7.5 mg, Oral, Daily   methocarbamol (ROBAXIN) 500 mg, Oral, Every 8 hours PRN   METRONIDAZOLE, TOPICAL, 0.75 % LOTN As needed   montelukast (SINGULAIR) 10 mg, Oral, Daily at bedtime   OVER THE COUNTER MEDICATION Zeasorb powder   polyethylene glycol (MIRALAX) 17 g, Oral, Daily   rosuvastatin (CRESTOR) 10 mg, Oral, Daily at bedtime   TERBINAFINE EX Apply externally   zolpidem (AMBIEN CR) 6.25 mg, Oral, At  bedtime PRN    Patient Active Problem List   Diagnosis Date Noted   GERD without esophagitis 09/11/2021   Chronic knee pain 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 of left knee joint 07/06/2021   Greater trochanteric bursitis of both hips 05/23/2021   Primary osteoarthritis of both hands 07/06/2019   Primary osteoarthritis of both knees 07/06/2019   Unilateral primary osteoarthritis, left knee 10/31/2018   Arthritis 10/30/2018   DM (diabetes mellitus) type II controlled, neurological manifestation ([place]) 10/30/2018   Neuropathy of both feet 10/30/2018   Anxiety 10/30/2018   Insomnia 10/30/2018   History of skin cancer 10/30/2018   Osteopenia 09/30/2018      Review of Systems  All other systems reviewed and are negative.     Objective:     BP 120/78 (BP Location: Right Arm, Patient Position: Sitting, Cuff Size: Normal)   Pulse 90   Temp 98.3 F (36.8 C) (Oral)   Ht 5' 4"$  (1.626 m)   Wt 162 lb 14.4 oz (73.9 kg)   SpO2 96%   BMI 27.96 kg/m  {Vitals History (Optional):23777}  Physical Exam Vitals reviewed.  Constitutional:      Appearance: Normal appearance. She is well-groomed and normal weight.  Eyes:     Conjunctiva/sclera: Conjunctivae normal.  Neck:     Thyroid: No thyromegaly.  Cardiovascular:     Rate and Rhythm: Normal rate and regular rhythm.     Pulses: Normal pulses.  Heart sounds: S1 normal and S2 normal.  Pulmonary:     Effort: Pulmonary effort is normal.     Breath sounds: Normal breath sounds and air entry.  Abdominal:     General: Bowel sounds are normal.  Musculoskeletal:     Right lower leg: No edema.     Left lower leg: No edema.  Neurological:     Mental Status: She is alert and oriented to person, place, and time. Mental status is at baseline.     Gait: Gait is intact.  Psychiatric:        Mood and Affect: Mood and affect normal.        Speech: Speech normal.        Behavior: Behavior normal.        Judgment: Judgment normal.      No results  found for any visits on 03/15/22.  {Labs (Optional):23779}  The 10-year ASCVD risk score (Arnett DK, et al., 2019) is: 12%    Assessment & Plan:   Problem List Items Addressed This Visit   None Visit Diagnoses     Arthralgia, unspecified joint       Relevant Medications   meloxicam (MOBIC) 7.5 MG tablet   methocarbamol (ROBAXIN) 500 MG tablet       No follow-ups on file.    Farrel Conners, MD

## 2022-03-16 ENCOUNTER — Encounter: Payer: Self-pay | Admitting: Family Medicine

## 2022-03-16 NOTE — Assessment & Plan Note (Signed)
Pt had muscle twitching with the pristiq. States that she feels that her symptoms are stable at this time off medication, does not wish to try other meds at this time. Reports she will be going to the wellness center to help her lose weight.

## 2022-03-27 ENCOUNTER — Encounter: Payer: Self-pay | Admitting: Family Medicine

## 2022-04-11 ENCOUNTER — Ambulatory Visit (INDEPENDENT_AMBULATORY_CARE_PROVIDER_SITE_OTHER): Payer: Medicare Other | Admitting: Podiatry

## 2022-04-11 DIAGNOSIS — M79675 Pain in left toe(s): Secondary | ICD-10-CM

## 2022-04-11 DIAGNOSIS — M79674 Pain in right toe(s): Secondary | ICD-10-CM | POA: Diagnosis not present

## 2022-04-11 DIAGNOSIS — B351 Tinea unguium: Secondary | ICD-10-CM

## 2022-04-11 NOTE — Progress Notes (Signed)
  Subjective:  Patient ID: Renee Bauer, female    DOB: Jun 04, 1950,  MRN: [id number]  Chief Complaint  Patient presents with   Nail Problem    Nail trim    72 y.o. female returns for the above complaint.  Patient presents with thickened elongated dystrophic toenails x10.  Pain full to touch.  She is a diabetic with last A1c of 5.7.  She would like to have her nails debrided down.  She denies any other acute complaints.  Objective:  There were no vitals filed for this visit. Podiatric Exam: Vascular: dorsalis pedis and posterior tibial pulses are palpable bilateral. Capillary return is immediate. Temperature gradient is WNL. Skin turgor WNL  Sensorium: Normal Semmes Weinstein monofilament test. Normal tactile sensation bilaterally. Nail Exam: Pt has thick disfigured discolored nails with subungual debris noted bilateral entire nail hallux through fifth toenails.  Except left second digit due to previous total nail avulsion.  Pain on palpation to the nails. Ulcer Exam: There is no evidence of ulcer or pre-ulcerative changes or infection. Orthopedic Exam: Muscle tone and strength are WNL. No limitations in general ROM. No crepitus or effusions noted. HAV  B/L.  Hammer toes 2-5  B/L. Skin: No Porokeratosis. No infection or ulcers    Assessment & Plan:   No diagnosis found.     Patient was evaluated and treated and all questions answered.  Onychomycosis with pain  -Nails palliatively debrided as below. -Educated on self-care  Procedure: Nail Debridement Rationale: pain  Type of Debridement: manual, sharp debridement. Instrumentation: Nail nipper, rotary burr. Number of Nails: 9  Procedures and Treatment: Consent by patient was obtained for treatment procedures. The patient understood the discussion of treatment and procedures well. All questions were answered thoroughly reviewed. Debridement of mycotic and hypertrophic toenails, 1 through 5 bilateral and clearing of subungual  debris. No ulceration, no infection noted.  Return Visit-Office Procedure: Patient instructed to return to the office for a follow up visit 3 months for continued evaluation and treatment.  Boneta Lucks, DPM    No follow-ups on file.

## 2022-04-26 NOTE — Progress Notes (Signed)
Office Visit Note  Patient: Renee Bauer             Date of Birth: 08/31/1950           MRN: [id number]             PCP: Karie GeorgesMichael, Barbara M, MD Referring: Wynn BankerKoberlein, Junell C, MD Visit Date: 05/10/2022 Occupation: @GUAROCC @  Subjective:  Pain in hips and SI joints  History of Present Illness: Renee Bauer is a 72 y.o. female history of osteoarthritis, degenerative disc disease.  She states she had good response to left CMC injection by Dr. Mina MarbleWeingold in the past.  She was recently experiencing increased [facility] which [facility] about 2 weeks [facility].  She also had nerve conduction velocities which showed mild carpal tunnel syndrome.  She has been using carpal tunnel syndrome braces.  She continues to have discomfort in the left knee joint which is replaced.  The right knee joint is not painful.  She complains of discomfort in both trochanteric bursa and the left SI joints.  She continues to have discomfort in the bilateral feet due to underlying osteoarthritis and neuropathy.  She has off-and-on discomfort in her lower back due to degenerative disc disease.    Activities of Daily Living:  Patient reports morning stiffness for 0 minute.   Patient Reports nocturnal pain.  Difficulty dressing/grooming: Denies Difficulty climbing stairs: Reports Difficulty getting out of chair: Denies Difficulty using hands for taps, buttons, cutlery, and/or writing: Denies  Review of Systems  Constitutional:  Negative for fatigue.  HENT:  Negative for mouth dryness.   Eyes:  Negative for dryness.  Respiratory:  Negative for shortness of breath.   Cardiovascular:  Negative for chest pain and palpitations.  Gastrointestinal:  Negative for blood in stool, constipation and diarrhea.  Endocrine: Negative for increased urination.  Genitourinary:  Negative for decreased urine output.  Musculoskeletal:  Positive for joint pain, joint pain, myalgias and myalgias. Negative for morning  stiffness.  Skin:  Negative for color change, rash and sensitivity to sunlight.  Allergic/Immunologic: Negative for susceptible to infections.  Neurological:  Negative for dizziness.  Hematological:  Negative for swollen glands.  Psychiatric/Behavioral:  Positive for depressed mood and sleep disturbance. The patient is nervous/anxious.     PMFS History:  Patient Active Problem List   Diagnosis Date Noted   GERD without esophagitis 09/11/2021   Chronic knee pain 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 of left knee joint 07/06/2021   Greater trochanteric bursitis of both hips 05/23/2021   Primary osteoarthritis of both hands 07/06/2019   Primary osteoarthritis of both knees 07/06/2019   Unilateral primary osteoarthritis, left knee 10/31/2018   Arthritis 10/30/2018   DM (diabetes mellitus) type II controlled, neurological manifestation 10/30/2018   Neuropathy of both feet 10/30/2018   Anxiety 10/30/2018   Insomnia 10/30/2018   History of skin cancer 10/30/2018   Osteopenia 09/30/2018    Past Medical History:  Diagnosis Date   Anxiety    Arthritis    Benign colon polyp 2018   Bursitis    Depression    Diabetes mellitus without complication    GERD (gastroesophageal reflux disease)    Hyperlipidemia    Rosacea     Family History  Problem Relation Age of Onset   Diabetes Mother    Arrhythmia Mother    High blood pressure Mother    Mesothelioma Father        asbestos exposure   Allergic rhinitis Father  Sinusitis Father    Healthy Sister    Post-traumatic stress disorder Son    Scoliosis Son    Brain cancer Maternal Aunt    Asthma Neg Hx    Eczema Neg Hx    Urticaria Neg Hx    Angioedema Neg Hx    Immunodeficiency Neg Hx    Atopy Neg Hx    Colon cancer Neg Hx    Esophageal cancer Neg Hx    Stomach cancer Neg Hx    Colon polyps Neg Hx    Past Surgical History:  Procedure Laterality Date   ABDOMINAL HYSTERECTOMY     menorrhagia   BLADDER REPAIR     BREAST BIOPSY Left     BUNIONECTOMY Right    COLON RESECTION  2007   COLONOSCOPY     DILATION AND CURETTAGE OF UTERUS     TOTAL KNEE ARTHROPLASTY Left 09/29/2020   Social History   Social History Narrative   Not on file   Immunization History  Administered Date(s) Administered   Fluad Quad(high Dose 65+) 11/04/2018   Influenza-Unspecified 09/30/2021   Moderna Sars-Covid-2 Vaccination 06/10/2019, 07/06/2019   Pneumococcal Conjugate-13 11/04/2018   Pneumococcal Polysaccharide-23 06/10/2020   Td 07/19/2016   Zoster Recombinat (Shingrix) 06/10/2020, 11/22/2020     Objective: Vital Signs: There were no vitals taken for this visit.   Physical Exam Vitals and nursing note reviewed.  Constitutional:      Appearance: She is well-developed.  HENT:     Head: Normocephalic and atraumatic.  Eyes:     Conjunctiva/sclera: Conjunctivae normal.  Cardiovascular:     Rate and Rhythm: Normal rate and regular rhythm.     Heart sounds: Normal heart sounds.  Pulmonary:     Effort: Pulmonary effort is normal.     Breath sounds: Normal breath sounds.  Abdominal:     General: Bowel sounds are normal.     Palpations: Abdomen is soft.  Musculoskeletal:     Cervical back: Normal range of motion.  Lymphadenopathy:     Cervical: No cervical adenopathy.  Skin:    General: Skin is warm and dry.     Capillary Refill: Capillary refill takes less than 2 seconds.  Neurological:     Mental Status: She is alert and oriented to person, place, and time.  Psychiatric:        Behavior: Behavior normal.      Musculoskeletal Exam: Cervical spine was in good range of motion.  She had discomfort range of motion of her lumbar spine.  Shoulder joints, elbow joints, wrist joints, MCPs PIPs and DIPs were in good range of motion with no synovitis.  She had bilateral CMC prominence without tenderness.  Hip joints were in good range of motion.  She had tenderness over bilateral trochanteric bursa and bilateral SI joints.  Left knee  joint was replaced without discomfort.  Right knee joint was in full range of motion.  There was no tenderness over ankles or MTPs.  CDAI Exam: CDAI Score: -- Patient Global: --; Provider Global: -- Swollen: --; Tender: -- Joint Exam 05/10/2022   No joint exam has been documented for this visit   There is currently no information documented on the homunculus. Go to the Rheumatology activity and complete the homunculus joint exam.  Investigation: No additional findings.  Imaging: No results found.  Recent Labs: Lab Results  Component Value Date   WBC 7.0 05/18/2021   HGB 14.2 05/18/2021   PLT 216.0 05/18/2021   NA 140  05/18/2021   K 3.6 05/18/2021   CL 103 05/18/2021   CO2 28 05/18/2021   GLUCOSE 89 05/18/2021   BUN 15 05/18/2021   CREATININE 0.86 05/18/2021   BILITOT 0.5 05/18/2021   ALKPHOS 78 05/18/2021   AST 18 05/18/2021   ALT 12 05/18/2021   PROT 7.2 05/18/2021   ALBUMIN 4.4 05/18/2021   CALCIUM 10.2 07/06/2021     Speciality Comments: No specialty comments available.  Procedures:  Sacroiliac Joint Inj on 05/10/2022 8:19 AM Indications: pain Details: 27 G 1.5 in needle, posterior approach Medications: 1 mL lidocaine 1 %; 40 mg triamcinolone acetonide 40 MG/ML Aspirate: 0 mL Outcome: tolerated well, no immediate complications Procedure, treatment alternatives, risks and benefits explained, specific risks discussed. Consent was given by the patient. Immediately prior to procedure a time out was called to verify the correct patient, procedure, equipment, support staff and site/side marked as required. Patient was prepped and draped in the usual sterile fashion.     Allergies: Tramadol, Wellbutrin [bupropion], and Clindamycin hcl   Assessment / Plan:     Visit Diagnoses: Primary osteoarthritis of both hands -patient had good response to left CMC injection by Dr. Mina MarbleWeingold in the past.  She had a right CMC injection about 2 weeks ago.  She states she was also  diagnosed with mild carpal tunnel syndrome and she has been using braces at night.  Trochanteric bursitis of both hips-she continues to have tenderness over bilateral trochanteric bursa.  Handout on IT band stretches were given and some of the exercises were demonstrated in the office.  Chronic left SI joint pain-she had tenderness over left SI joint.  Patient requested left SI joint injection.  After informed consent was obtained and side effects were discussed the left SI joint was prepped in sterile fashion injected with lidocaine and Kenalog as described above.  Patient tolerated the procedure well.  Postprocedure instructions were given.  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 Valentina GuLucy.  She has chronic discomfort in her left knee.  Primary osteoarthritis of right knee - Right knee moderate osteoarthritis,  moderate chondromalacia patella.  She denies any discomfort today.  Primary osteoarthritis of both feet-she has intermittent discomfort due to osteoarthritis.  Proper fitting shoes were advised.  Neuropathy of both feet-she has discomfort from neuropathy.  DDD (degenerative disc disease), lumbar - x-rays of lumbar spine from February 2022 and MRI from June 2022.  X-rays were consistent with degenerative disc disease and facet joint arthropathy.  She she has intermittent chronic discomfort.  Osteopenia of multiple sites - Treated with Reclast in the past.  April 21, 2021DXA T score -1.3. 11/08/21 DEXA T -1.4, BMD 0.846 right femoral neck   Calcium rich diet was discussed.  Controlled type 2 diabetes mellitus with diabetic polyneuropathy, without long-term current use of insulin-he has been monitoring her blood sugar.  She was advised to monitor blood glucose closely after the cortisone injection.  Further medical problems are listed as follows:  History of hyperlipidemia-patient is on Crestor.  History of gastroesophageal reflux (GERD)  Anxiety and  depression  History of skin cancer  Hx of colonic polyps  Other insomnia  Orders: No orders of the defined types were placed in this encounter.  No orders of the defined types were placed in this encounter.   Follow-Up Instructions: Return in about 1 year (around 05/10/2023) for Osteoarthritis.   Pollyann SavoyShaili Carly Applegate, MD  Note - This record has been created using Animal nutritionistDragon software.  Chart creation errors have been sought, but may not always  have been located. Such creation errors do not reflect on  the standard of medical care.

## 2022-05-01 ENCOUNTER — Encounter: Payer: Self-pay | Admitting: Podiatry

## 2022-05-08 NOTE — Progress Notes (Unsigned)
[facility] [address] [place] [address] Phone: [phone] Subjective:    I'm seeing this patient by the request  of:  Karie Georges, MD  CC: Bilateral hip and left knee pain  MOQ:[id number]  02/08/2022 Chronic, that is likely contributing to more of how patient is antalgic gait from her knee pain.  Last seen patient orthopedic surgeon in the near future so hopefully that is going to be okay.  Follow-up with me again in 6 to 8 weeks.  Continue to work on hip abductor strengthening otherwise.      Update 05/10/2022 Renee Bauer is a 72 y.o. female coming in with complaint of B hip and L knee pain. Patient states that she continues to have GT pain on L hip and would like injection if that is ok as she had SI jt injection earlier today. R side also bothers her from time to time as well.   Knee pain is the same as last visit. C/o bruising on skin since last year. History of TKR.        Past Medical History:  Diagnosis Date   Anxiety    Arthritis    Benign colon polyp 2018   Bursitis    Depression    Diabetes mellitus without complication    GERD (gastroesophageal reflux disease)    Hyperlipidemia    Rosacea    Past Surgical History:  Procedure Laterality Date   ABDOMINAL HYSTERECTOMY     menorrhagia   BLADDER REPAIR     BREAST BIOPSY Left    BUNIONECTOMY Right    COLON RESECTION  2007   COLONOSCOPY     DILATION AND CURETTAGE OF UTERUS     TOTAL KNEE ARTHROPLASTY Left 09/29/2020   Social History   Socioeconomic History   Marital status: Married    Spouse name: Not on file   Number of children: 2   Years of education: Not on file   Highest education level: Associate degree: occupational, Scientist, product/process development, or vocational program  Occupational History   Occupation: Retired  Tobacco Use   Smoking status: Never   Smokeless tobacco: Never  Vaping Use   Vaping Use: Never used  Substance and Sexual Activity   Alcohol use: Yes     Comment: occasional wine   Drug use: Never   Sexual activity: Not on file  Other Topics Concern   Not on file  Social History Narrative   Not on file   Social Determinants of Health   Financial Resource Strain: Low Risk  (01/09/2022)   Overall Financial Resource Strain (CARDIA)    Difficulty of Paying Living Expenses: Not hard at all  Food Insecurity: No Food Insecurity (01/09/2022)   Hunger Vital Sign    Worried About Running Out of Food in the Last Year: Never true    Ran Out of Food in the Last Year: Never true  Transportation Needs: No Transportation Needs (01/09/2022)   PRAPARE - Administrator, Civil Service (Medical): No    Lack of Transportation (Non-Medical): No  Physical Activity: Sufficiently Active (01/09/2022)   Exercise Vital Sign    Days of Exercise per Week: 4 days    Minutes of Exercise per Session: 60 min  Stress: Stress Concern Present (01/09/2022)   [employer] of Occupational Health - Occupational Stress Questionnaire    Feeling of Stress : Rather much  Social Connections: Socially Integrated (01/09/2022)   Social Connection and Isolation Panel [NHANES]  Frequency of Communication with Friends and Family: Once a week    Frequency of Social Gatherings with Friends and Family: Twice a week    Attends Religious Services: More than 4 times per year    Active Member of [employer] Financial[employer]ubs or Organizations: Yes    Attends Engineer, structuralClub or Organization Meetings: More than 4 times per year    Marital Status: Married   Allergies  Allergen Reactions   Tramadol     Felt sick with this   Wellbutrin [Bupropion]     Muscle twitches   Clindamycin Hcl Rash   Family History  Problem Relation Age of Onset   Diabetes Mother    Arrhythmia Mother    High blood pressure Mother    Mesothelioma Father        asbestos exposure   Allergic rhinitis Father    Sinusitis Father    Healthy Sister    Post-traumatic stress disorder Son    Scoliosis Son    Brain cancer  Maternal Aunt    Asthma Neg Hx    Eczema Neg Hx    Urticaria Neg Hx    Angioedema Neg Hx    Immunodeficiency Neg Hx    Atopy Neg Hx    Colon cancer Neg Hx    Esophageal cancer Neg Hx    Stomach cancer Neg Hx    Colon polyps Neg Hx      Current Outpatient Medications (Cardiovascular):    rosuvastatin (CRESTOR) 10 MG tablet, Take 1 tablet (10 mg total) by mouth at bedtime.  Current Outpatient Medications (Respiratory):    fluticasone (FLONASE) 50 MCG/ACT nasal spray, Place 2 sprays into both nostrils daily.   montelukast (SINGULAIR) 10 MG tablet, Take 1 tablet (10 mg total) by mouth at bedtime.  Current Outpatient Medications (Analgesics):    meloxicam (MOBIC) 7.5 MG tablet, Take 1 tablet (7.5 mg total) by mouth daily.  Current Outpatient Medications (Hematological):    vitamin B-12 (CYANOCOBALAMIN) 1000 MCG tablet, Take 1,000 mcg by mouth daily.  Current Outpatient Medications (Other):    ALPRAZolam (XANAX) 1 MG tablet, TAKE 1 TABLET AS NEEDED FOR ANXIETY   CALCIUM CITRATE PO, Take 600 mg by mouth daily.   Cholecalciferol (VITAMIN D3 PO), Take 2,000 Units by mouth daily.   esomeprazole (NEXIUM) 40 MG capsule, TAKE 1 CAPSULE DAILY   gabapentin (NEURONTIN) 100 MG capsule, TAKE 1 CAPSULE THREE TIMES A DAY   hydrocortisone (ANUSOL-HC) 2.5 % rectal cream, Place 1 Application rectally 2 (two) times daily. (Patient taking differently: Place 1 Application rectally as needed.)   lidocaine-prilocaine (EMLA) cream, APPLY 1 APPLICATION TOPICALLY AS NEEDED   methocarbamol (ROBAXIN) 500 MG tablet, Take 1 tablet (500 mg total) by mouth every 8 (eight) hours as needed for muscle spasms.   METRONIDAZOLE, TOPICAL, 0.75 % LOTN, as needed.    OVER THE COUNTER MEDICATION, Zeasorb powder   polyethylene glycol (MIRALAX) 17 g packet, Take 17 g by mouth daily.   TERBINAFINE EX, Apply topically.   zolpidem (AMBIEN CR) 6.25 MG CR tablet, Take 1 tablet (6.25 mg total) by mouth at bedtime as needed for  sleep.   Reviewed prior external information including notes and imaging from  primary care provider As well as notes that were available from care everywhere and other healthcare systems.  Past medical history, social, surgical and family history all reviewed in electronic medical record.  No pertanent information unless stated regarding to the chief complaint.   Review of Systems:  No headache, visual changes, nausea,  vomiting, diarrhea, constipation, dizziness, abdominal pain, skin rash, fevers, chills, night sweats, weight loss, swollen lymph nodes,  joint swelling, chest pain, shortness of breath, mood changes. POSITIVE muscle aches, body aches  Objective  Blood pressure 110/78, pulse 67, height 5' 1.5" (1.562 m), weight 153 lb (69.4 kg), SpO2 96 %.   General: No apparent distress alert and oriented x3 mood and affect normal, dressed appropriately.  HEENT: Pupils equal, extraocular movements intact  Respiratory: Patient's speak in full sentences and does not appear short of breath  Cardiovascular: No lower extremity edema, non tender, no erythema  Patient's left knee does still show that there is some mild hypoechoic changes still surrounding the replacement, does have bruising over the pes anserine.    Impression and Recommendations:    The above documentation has been reviewed and is accurate and complete Judi Saa, DO

## 2022-05-09 ENCOUNTER — Ambulatory Visit: Payer: Medicare Other | Admitting: Internal Medicine

## 2022-05-10 ENCOUNTER — Encounter: Payer: Self-pay | Admitting: Family Medicine

## 2022-05-10 ENCOUNTER — Encounter: Payer: Self-pay | Admitting: Rheumatology

## 2022-05-10 ENCOUNTER — Ambulatory Visit (INDEPENDENT_AMBULATORY_CARE_PROVIDER_SITE_OTHER): Payer: Medicare Other | Admitting: Family Medicine

## 2022-05-10 ENCOUNTER — Ambulatory Visit: Payer: Medicare Other | Attending: Rheumatology | Admitting: Rheumatology

## 2022-05-10 VITALS — BP 110/78 | HR 67 | Ht 61.5 in | Wt 153.0 lb

## 2022-05-10 VITALS — BP 112/76 | HR 67 | Resp 14 | Ht 61.5 in | Wt 152.0 lb

## 2022-05-10 DIAGNOSIS — M7062 Trochanteric bursitis, left hip: Secondary | ICD-10-CM | POA: Diagnosis not present

## 2022-05-10 DIAGNOSIS — M8589 Other specified disorders of bone density and structure, multiple sites: Secondary | ICD-10-CM

## 2022-05-10 DIAGNOSIS — F419 Anxiety disorder, unspecified: Secondary | ICD-10-CM

## 2022-05-10 DIAGNOSIS — Z96652 Presence of left artificial knee joint: Secondary | ICD-10-CM | POA: Diagnosis not present

## 2022-05-10 DIAGNOSIS — Z8639 Personal history of other endocrine, nutritional and metabolic disease: Secondary | ICD-10-CM

## 2022-05-10 DIAGNOSIS — M19041 Primary osteoarthritis, right hand: Secondary | ICD-10-CM | POA: Diagnosis not present

## 2022-05-10 DIAGNOSIS — M19072 Primary osteoarthritis, left ankle and foot: Secondary | ICD-10-CM

## 2022-05-10 DIAGNOSIS — G5793 Unspecified mononeuropathy of bilateral lower limbs: Secondary | ICD-10-CM

## 2022-05-10 DIAGNOSIS — F32A Depression, unspecified: Secondary | ICD-10-CM

## 2022-05-10 DIAGNOSIS — M533 Sacrococcygeal disorders, not elsewhere classified: Secondary | ICD-10-CM | POA: Diagnosis not present

## 2022-05-10 DIAGNOSIS — Z86010 Personal history of colonic polyps: Secondary | ICD-10-CM

## 2022-05-10 DIAGNOSIS — M5136 Other intervertebral disc degeneration, lumbar region: Secondary | ICD-10-CM

## 2022-05-10 DIAGNOSIS — M7061 Trochanteric bursitis, right hip: Secondary | ICD-10-CM

## 2022-05-10 DIAGNOSIS — M25562 Pain in left knee: Secondary | ICD-10-CM

## 2022-05-10 DIAGNOSIS — Z8719 Personal history of other diseases of the digestive system: Secondary | ICD-10-CM

## 2022-05-10 DIAGNOSIS — G8929 Other chronic pain: Secondary | ICD-10-CM

## 2022-05-10 DIAGNOSIS — G4709 Other insomnia: Secondary | ICD-10-CM

## 2022-05-10 DIAGNOSIS — M19071 Primary osteoarthritis, right ankle and foot: Secondary | ICD-10-CM

## 2022-05-10 DIAGNOSIS — Z85828 Personal history of other malignant neoplasm of skin: Secondary | ICD-10-CM

## 2022-05-10 DIAGNOSIS — E1142 Type 2 diabetes mellitus with diabetic polyneuropathy: Secondary | ICD-10-CM

## 2022-05-10 DIAGNOSIS — M1711 Unilateral primary osteoarthritis, right knee: Secondary | ICD-10-CM

## 2022-05-10 DIAGNOSIS — M19042 Primary osteoarthritis, left hand: Secondary | ICD-10-CM

## 2022-05-10 MED ORDER — TRIAMCINOLONE ACETONIDE 40 MG/ML IJ SUSP
40.0000 mg | INTRAMUSCULAR | Status: AC | PRN
  Administered 2022-05-10: 40 mg via INTRA_ARTICULAR

## 2022-05-10 MED ORDER — LIDOCAINE HCL 1 % IJ SOLN
1.0000 mL | INTRAMUSCULAR | Status: AC | PRN
  Administered 2022-05-10: 1 mL

## 2022-05-10 NOTE — Assessment & Plan Note (Signed)
Patient has had difficulty with the hips, with patient's recent sacroiliac joint injection and would like to pause and not give her 1 to see how much that she gets better from this.  I do think that there is a possibility of [place] repeating this though if necessary.  We discussed icing regimen and home exercises still, proper shoes, continue to monitor.

## 2022-05-10 NOTE — Patient Instructions (Addendum)
Good to see you  Arnica lotion Graston tool can be helpful Ice the hip before bed  See me in two weeks

## 2022-05-10 NOTE — Patient Instructions (Signed)
Iliotibial Band Syndrome Rehab Ask your health care provider which exercises are safe for you. Do exercises exactly as told by your health care provider and adjust them as directed. It is normal to feel mild stretching, pulling, tightness, or discomfort as you do these exercises. Stop right away if you feel sudden pain or your pain gets significantly worse. Do not begin these exercises until told by your health care provider. Stretching and range-of-motion exercises These exercises warm up your muscles and joints and improve the movement and flexibility of your hip and pelvis. Quadriceps stretch, prone  Lie on your abdomen (prone position) on a firm surface, such as a bed or padded floor. Bend your left / right knee and reach back to hold your ankle or pant leg. If you cannot reach your ankle or pant leg, loop a belt around your foot and grab the belt instead. Gently pull your heel toward your buttocks. Your knee should not slide out to the side. You should feel a stretch in the front of your thigh and knee (quadriceps). Hold this position for __________ seconds. Repeat __________ times. Complete this exercise __________ times a day. Iliotibial band stretch An iliotibial band is a strong band of muscle tissue that runs from the outer side of your hip to the outer side of your thigh and knee. Lie on your side with your left / right leg in the top position. Bend both of your knees and grab your left / right ankle. Stretch out your bottom arm to help you balance. Slowly bring your top knee back so your thigh goes behind your trunk. Slowly lower your top leg toward the floor until you feel a gentle stretch on the outside of your left / right hip and thigh. If you do not feel a stretch and your knee will not fall farther, place the heel of your other foot on top of your knee and pull your knee down toward the floor with your foot. Hold this position for __________ seconds. Repeat __________ times.  Complete this exercise __________ times a day. Strengthening exercises These exercises build strength and endurance in your hip and pelvis. Endurance is the ability to use your muscles for a long time, even after they get tired. Straight leg raises, side-lying This exercise strengthens the muscles that rotate the leg at the hip and move it away from your body (hip abductors). Lie on your side with your left / right leg in the top position. Lie so your head, shoulder, hip, and knee line up. You may bend your bottom knee to help you balance. Roll your hips slightly forward so your hips are stacked directly over each other and your left / right knee is facing forward. Tense the muscles in your outer thigh and lift your top leg 4-6 inches (10-15 cm). Hold this position for __________ seconds. Slowly lower your leg to return to the starting position. Let your muscles relax completely before doing another repetition. Repeat __________ times. Complete this exercise __________ times a day. Leg raises, prone This exercise strengthens the muscles that move the hips backward (hip extensors). Lie on your abdomen (prone position) on your bed or a firm surface. You can put a pillow under your hips if that is more comfortable for your lower back. Bend your left / right knee so your foot is straight up in the air. Squeeze your buttocks muscles and lift your left / right thigh off the bed. Do not let your back arch. Tense   your thigh muscle as hard as you can without increasing any knee pain. Hold this position for __________ seconds. Slowly lower your leg to return to the starting position and allow it to relax completely. Repeat __________ times. Complete this exercise __________ times a day. Hip hike Stand sideways on a bottom step. Stand on your left / right leg with your other foot unsupported next to the step. You can hold on to a railing or wall for balance if needed. Keep your knees straight and your  torso square. Then lift your left / right hip up toward the ceiling. Slowly let your left / right hip lower toward the floor, past the starting position. Your foot should get closer to the floor. Do not lean or bend your knees. Repeat __________ times. Complete this exercise __________ times a day. This information is not intended to replace advice given to you by your health care provider. Make sure you discuss any questions you have with your health care provider. Document Revised: 03/26/2019 Document Reviewed: 03/26/2019 Elsevier Patient Education  2023 Elsevier Inc.  

## 2022-05-10 NOTE — Assessment & Plan Note (Signed)
Patient does have some fat atrophy from previous injection.  This is in the pes anserine area.  I think that this is causing more of the bruising in the area.  Discussed Arnica gel, home exercises and icing regimen.  Discussed potential compression if necessary.  Patient still feels of feeling of instability of the knee and will continue to wear a brace on a regular basis.  Follow-up with me again 2 to 3 months otherwise total time reviewing outside records including rheumatology as well as discussing with patient 31 minutes

## 2022-05-15 ENCOUNTER — Ambulatory Visit (INDEPENDENT_AMBULATORY_CARE_PROVIDER_SITE_OTHER): Payer: Medicare Other

## 2022-05-15 DIAGNOSIS — Q666 Other congenital valgus deformities of feet: Secondary | ICD-10-CM

## 2022-05-15 NOTE — Progress Notes (Signed)
Patient presents to the office today with issues concerning their custom inserts. Upon looking at the label on the inserts, the label is for a different patient. Spoke with Angie at [facility] lab and they are aware that the mistake was made and will get the inserts corrected.  Will send orthotics to lab for adjustments.  Advised patient that she will receive a call from the office to come in for a fitting.

## 2022-05-30 ENCOUNTER — Telehealth: Payer: Self-pay | Admitting: Family Medicine

## 2022-05-30 NOTE — Telephone Encounter (Signed)
Contacted Renee Bauer to schedule their annual wellness visit. Appointment made for 06/08/22.  [name] AWV direct phone # [phone]   Do to schedule change moved 5/9 AWV to [employer] schedule

## 2022-06-06 ENCOUNTER — Ambulatory Visit (INDEPENDENT_AMBULATORY_CARE_PROVIDER_SITE_OTHER): Payer: Medicare Other

## 2022-06-06 DIAGNOSIS — Q666 Other congenital valgus deformities of feet: Secondary | ICD-10-CM

## 2022-06-06 NOTE — Progress Notes (Signed)
Patient presents today to pick up custom molded foot orthotics recommended by Dr. PATEL.   Orthotics were dispensed and fit was satisfactory. Reviewed instructions for break-in and wear. Written instructions given to patient.  Patient will follow up as needed.   

## 2022-06-08 ENCOUNTER — Encounter: Payer: Self-pay | Admitting: Family Medicine

## 2022-06-08 ENCOUNTER — Ambulatory Visit (INDEPENDENT_AMBULATORY_CARE_PROVIDER_SITE_OTHER): Payer: Medicare Other | Admitting: Family Medicine

## 2022-06-08 DIAGNOSIS — Z0000 Encounter for general adult medical examination without abnormal findings: Secondary | ICD-10-CM

## 2022-06-08 NOTE — Progress Notes (Signed)
PATIENT CHECK-IN and HEALTH RISK ASSESSMENT QUESTIONNAIRE:  -completed by phone/video for upcoming Medicare Preventive Visit  Pre-Visit Check-in: 1)Vitals (height, wt, BP, etc) - record in vitals section for visit on day of visit 2)Review and Update Medications, Allergies PMH, Surgeries, Social history in Epic 3)Hospitalizations in the last year with date/reason? No  4)Review and Update Care Team (patient's specialists) in Epic 5) Complete PHQ9 in Epic  6) Complete Fall Screening in Epic 7)Review all Health Maintenance Due and order under PCP if not done. Requested eye exam results on 06/08/22  8)Medicare Wellness Questionnaire: Answer theses question about your habits: Do you drink alcohol? Yes If yes, how many drinks do you have a day? Socially Have you ever smoked?No Quit date if applicable? N/a  How many packs a day do/did you smoke? N/a Do you use smokeless tobacco?n/a Do you use an illicit drugs?no Do you exercises? Yes IF so, what type and how many days/minutes per week? Walks 2 miles/day 5 days/wk; uses stationary bike & some light weights, feels balance is good Are you sexually active? No Number of partners?n/a Typical breakfast oatmeal Typical lunch protein with an apple; almonds, yogurt Typical dinner meat & veggies Typical snacks: Almonds; whole grain crackers  Beverages: water; 1c coffee  Answer theses question about you: Can you perform most household chores? Yes Do you find it hard to follow a conversation in a noisy room?No Do you often ask people to speak up or repeat themselves?No Do you feel that you have a problem with memory? No Do you balance your checkbook and or bank acounts? Yes Do you feel safe at home?Yes Last dentist visit? Has an appt this month; goes q8months Do you need assistance with any of the following: Please note if so does not need assistance with any of this.  Driving?  Feeding yourself?  Getting from bed to chair?  Getting to the  toilet?  Bathing or showering?  Dressing yourself?  Managing money?  Climbing a flight of stairs  Preparing meals?  Do you have Advanced Directives in place (Living Will, Healthcare Power or Attorney)? Yes   Last eye Exam and location? Jan 2024; [facility]   Do you currently use prescribed or non-prescribed narcotic or opioid pain medications? no  Do you have a history or close family history of breast, ovarian, tubal or peritoneal cancer or a family member with BRCA (breast cancer susceptibility 1 and 2) gene mutations? no  Nurse/Assistant Credentials/time stamp: Claudette Laws BSN, Editor, commissioning [facility] Clinical RN Supervisor    ----------------------------------------------------------------------------------------------------------------------------------------------------------------------------------------------------------------------   MEDICARE ANNUAL PREVENTIVE VISIT WITH PROVIDER: (Welcome to Medicare, initial annual wellness or annual wellness exam)  Virtual Visit via Video Note  I connected with Renee Bauer on 06/08/22 by  a video enabled telemedicine application and verified that I am speaking with the correct person using two identifiers.  Location patient: home Location provider:work or home office Persons participating in the virtual visit: patient, provider  Concerns and/or follow up today: reports is doing well.   See HM section in Epic for other details of completed HM.    ROS: negative for report of fevers, unintentional weight loss, vision changes, vision loss, hearing loss or change, chest pain, sob, hemoptysis, melena, hematochezia, hematuria, falls, bleeding or bruising, thoughts of suicide or self harm, memory loss  Patient-completed extensive health risk assessment - reviewed and discussed with the patient: See Health Risk Assessment completed with patient prior to the visit either above or in recent  phone note. This was  reviewed in detailed with the patient today and appropriate recommendations, orders and referrals were placed as needed per Summary below and patient instructions.   Review of Medical History: -PMH, PSH, Family History and current specialty and care providers reviewed and updated and listed below   Patient Care Team: Karie Georges, MD as PCP - General (Family Medicine) [facility]   Past Medical History:  Diagnosis Date   Anxiety    Arthritis    Benign colon polyp 2018   Bursitis    Depression    Diabetes mellitus without complication (HCC)    GERD (gastroesophageal reflux disease)    Hyperlipidemia    Rosacea     Past Surgical History:  Procedure Laterality Date   ABDOMINAL HYSTERECTOMY     menorrhagia   BLADDER REPAIR     BREAST BIOPSY Left    BUNIONECTOMY Right    COLON RESECTION  2007   COLONOSCOPY     DILATION AND CURETTAGE OF UTERUS     TOTAL KNEE ARTHROPLASTY Left 09/29/2020    Social History   Socioeconomic History   Marital status: Married    Spouse name: Not on file   Number of children: 2   Years of education: Not on file   Highest education level: Associate degree: occupational, Scientist, product/process development, or vocational program  Occupational History   Occupation: Retired  Tobacco Use   Smoking status: Never   Smokeless tobacco: Never  Vaping Use   Vaping Use: Never used  Substance and Sexual Activity   Alcohol use: Yes    Comment: occasional wine   Drug use: Never   Sexual activity: Not Currently  Other Topics Concern   Not on file  Social History Narrative   Not on file   Social Determinants of Health   Financial Resource Strain: Low Risk  (01/09/2022)   Overall Financial Resource Strain (CARDIA)    Difficulty of Paying Living Expenses: Not hard at all  Food Insecurity: No Food Insecurity (01/09/2022)   Hunger Vital Sign    Worried About Running Out of Food in the Last Year: Never true    Ran Out of Food in the Last Year: Never  true  Transportation Needs: No Transportation Needs (01/09/2022)   PRAPARE - Administrator, Civil Service (Medical): No    Lack of Transportation (Non-Medical): No  Physical Activity: Sufficiently Active (01/09/2022)   Exercise Vital Sign    Days of Exercise per Week: 4 days    Minutes of Exercise per Session: 60 min  Stress: Stress Concern Present (01/09/2022)   [employer] of Occupational Health - Occupational Stress Questionnaire    Feeling of Stress : Rather much  Social Connections: Socially Integrated (01/09/2022)   Social Connection and Isolation Panel [NHANES]    Frequency of Communication with Friends and Family: Once a week    Frequency of Social Gatherings with Friends and Family: Twice a week    Attends Religious Services: More than 4 times per year    Active Member of [employer] or Organizations: Yes    Attends Banker Meetings: Not on file    Marital Status: Married  Intimate Partner Violence: Not At Risk (06/06/2021)   Humiliation, Afraid, Rape, and Kick questionnaire    Fear of Current or Ex-Partner: No    Emotionally Abused: No    Physically Abused: No    Sexually Abused: No    Family History  Problem Relation Age of  Onset   Diabetes Mother    Arrhythmia Mother    High blood pressure Mother    Mesothelioma Father        asbestos exposure   Allergic rhinitis Father    Sinusitis Father    Healthy Sister    Post-traumatic stress disorder Son    Scoliosis Son    Brain cancer Maternal Aunt    Asthma Neg Hx    Eczema Neg Hx    Urticaria Neg Hx    Angioedema Neg Hx    Immunodeficiency Neg Hx    Atopy Neg Hx    Colon cancer Neg Hx    Esophageal cancer Neg Hx    Stomach cancer Neg Hx    Colon polyps Neg Hx     Current Outpatient Medications on File Prior to Visit  Medication Sig Dispense Refill   ALPRAZolam (XANAX) 1 MG tablet TAKE 1 TABLET AS NEEDED FOR ANXIETY 30 tablet 2   CALCIUM CITRATE PO Take 600 mg by mouth daily.      Cholecalciferol (VITAMIN D3 PO) Take 2,000 Units by mouth daily.     esomeprazole (NEXIUM) 40 MG capsule TAKE 1 CAPSULE DAILY 90 capsule 1   fluticasone (FLONASE) 50 MCG/ACT nasal spray Place 2 sprays into both nostrils daily. 48 g 3   gabapentin (NEURONTIN) 100 MG capsule TAKE 1 CAPSULE THREE TIMES A DAY 90 capsule 11   hydrocortisone (ANUSOL-HC) 2.5 % rectal cream Place 1 Application rectally 2 (two) times daily. (Patient taking differently: Place 1 Application rectally as needed.) 30 g 1   lidocaine-prilocaine (EMLA) cream APPLY 1 APPLICATION TOPICALLY AS NEEDED 30 g 11   meloxicam (MOBIC) 7.5 MG tablet Take 1 tablet (7.5 mg total) by mouth daily. 180 tablet 1   methocarbamol (ROBAXIN) 500 MG tablet Take 1 tablet (500 mg total) by mouth every 8 (eight) hours as needed for muscle spasms. 60 tablet 2   METRONIDAZOLE, TOPICAL, 0.75 % LOTN as needed.      montelukast (SINGULAIR) 10 MG tablet Take 1 tablet (10 mg total) by mouth at bedtime. 90 tablet 3   OVER THE COUNTER MEDICATION Zeasorb powder     polyethylene glycol (MIRALAX) 17 g packet Take 17 g by mouth daily. 14 each 0   rosuvastatin (CRESTOR) 10 MG tablet Take 1 tablet (10 mg total) by mouth at bedtime. 90 tablet 1   TERBINAFINE EX Apply topically.     vitamin B-12 (CYANOCOBALAMIN) 1000 MCG tablet Take 1,000 mcg by mouth daily.     zolpidem (AMBIEN CR) 6.25 MG CR tablet Take 1 tablet (6.25 mg total) by mouth at bedtime as needed for sleep. 90 tablet 1   No current facility-administered medications on file prior to visit.    Allergies  Allergen Reactions   Tramadol     Felt sick with this   Wellbutrin [Bupropion]     Muscle twitches   Clindamycin Hcl Rash       Physical Exam There were no vitals filed for this visit. Estimated body mass index is 28.44 kg/m as calculated from the following:   Height as of 05/10/22: 5' 1.5" (1.562 m).   Weight as of 05/10/22: 153 lb (69.4 kg).  EKG (optional): deferred due to virtual  visit  GENERAL: alert, oriented, no acute distress detected, full vision exam deferred due to pandemic and/or virtual encounter  PSYCH/NEURO: pleasant and cooperative, no obvious depression or anxiety, speech and thought processing grossly intact, Cognitive function grossly intact  [employer]  Visit from 01/10/2022 in [facility] [place]  PHQ-9 Total Score 9           06/08/2022    9:56 AM 01/10/2022    3:59 PM 09/09/2021   10:30 AM 06/06/2021   11:26 AM 05/18/2021    3:55 PM  Depression screen PHQ 2/9  Decreased Interest 0 3 0 0 0  Down, Depressed, Hopeless 0 3 0 0 0  PHQ - 2 Score 0 6 0 0 0  Altered sleeping  2 0 0 1  Tired, decreased energy  0 0 0 1  Change in appetite  0 0 0 2  Feeling bad or failure about yourself   1 3 0 2  Trouble concentrating  0 0 0 0  Moving slowly or fidgety/restless  0 0 0 0  Suicidal thoughts  0 0 0 0  PHQ-9 Score  9 3 0 6  Difficult doing work/chores    Not difficult at all Not difficult at all       06/03/2021   12:47 PM 06/06/2021   11:29 AM 01/09/2022   10:15 AM 06/05/2022    4:35 PM 06/08/2022   10:06 AM  Fall Risk  Falls in the past year? 0 0 0 0 0  Was there an injury with Fall? 0 0   0  Fall Risk Category Calculator  0   0  Fall Risk Category (Retired)  Low     Patient at Risk for Falls Due to  No Fall Risks   No Fall Risks  Fall risk Follow up     Falls evaluation completed     SUMMARY AND PLAN:  Encounter for Medicare annual wellness exam   Discussed applicable health maintenance/preventive health measures and advised and referred or ordered per patient preferences: -has appointment scheduled with PCP to do labs, diabetes has been well controlled for years -did eye exam earlier this year, asked nurse to obtain report -discussed covid19 booster recs, can get at pharmacy if wishes to do Health Maintenance  Topic Date Due   COVID-19 Vaccine (3 - Moderna risk series) 08/03/2019   Diabetic kidney  evaluation - Urine ACR  03/24/2021   OPHTHALMOLOGY EXAM  03/02/2022   HEMOGLOBIN A1C  03/12/2022   Diabetic kidney evaluation - eGFR measurement  05/19/2022   INFLUENZA VACCINE  08/31/2022   FOOT EXAM  09/03/2022   MAMMOGRAM  12/17/2022   Medicare Annual Wellness (AWV)  06/08/2023   COLONOSCOPY (Pts 45-38yrs Insurance coverage will need to be confirmed)  08/04/2024   DTaP/Tdap/Td (2 - Tdap) 07/20/2026   Pneumonia Vaccine 64+ Years old  Completed   DEXA SCAN  Completed   Hepatitis C Screening  Completed   Zoster Vaccines- Shingrix  Completed   HPV VACCINES  Aged [employer] and counseling on the following was provided based on the above review of health and a plan/checklist for the patient, along with additional information discussed, was provided for the patient in the patient instructions :   -Reviewed and demonstrated safe balance exercises that can be done at home to improve balance and discussed exercise guidelines for adults with include balance exercises at least 3 days per week.  -Advised and counseled on a healthy lifestyle - including the importance of a healthy diet, regular physical activity, social connections and stress management. -Reviewed patient's current diet. Advised and counseled on a whole foods based healthy diet. A summary of a healthy diet was provided in the  Patient Instructions. She is contentious of diet - encouraged 5 servings per day of whole foods based fruits/veggies. -reviewed patient's current physical activity level and discussed exercise guidelines for adults. Congratulated on current exercise - wonderful! Encouraged to add balance exercises.  -Advise yearly dental visits at minimum and regular eye exams -Advised and counseled on alcohol safe limits  Follow up: see patient instructions     Patient Instructions  I really enjoyed getting to talk with you today! I am available on Tuesdays and Thursdays for virtual visits if you have any questions  or concerns, or if I can be of any further assistance.   CHECKLIST FROM ANNUAL WELLNESS VISIT:  -Follow up (please call to schedule if not scheduled after visit):   -09/13/22 @ 8:00am for physical exam with Dr. Russella Dar   -yearly for annual wellness visit with primary care office  Here is a list of your preventive care/health maintenance measures and the plan for each if any are due:  PLAN For any measures below that may be due:  -can get labs at your scheduled physical with Dr. Russella Dar this summer -can get the covid19 booster at the pharmacy  Health Maintenance  Topic Date Due   COVID-19 Vaccine (3 - Moderna risk series) 08/03/2019   Diabetic kidney evaluation - Urine ACR  03/24/2021   OPHTHALMOLOGY EXAM  03/02/2022   HEMOGLOBIN A1C  03/12/2022   Diabetic kidney evaluation - eGFR measurement  05/19/2022   INFLUENZA VACCINE  08/31/2022   FOOT EXAM  09/03/2022   MAMMOGRAM  12/17/2022   Medicare Annual Wellness (AWV)  06/08/2023   COLONOSCOPY (Pts 45-34yrs Insurance coverage will need to be confirmed)  08/04/2024   DTaP/Tdap/Td (2 - Tdap) 07/20/2026   Pneumonia Vaccine 55+ Years old  Completed   DEXA SCAN  Completed   Hepatitis C Screening  Completed   Zoster Vaccines- Shingrix  Completed   HPV VACCINES  Aged Out    -See a dentist at least yearly  -Get your eyes checked and then per your eye specialist's recommendations  -Other issues addressed today:   -I have included below further information regarding a healthy whole foods based diet, physical activity guidelines for adults, stress management and opportunities for social connections. I hope you find this information useful.   -----------------------------------------------------------------------------------------------------------------------------------------------------------------------------------------------------------------------------------------------------------  NUTRITION: -eat real food: lots of colorful  vegetables (half the plate) and fruits -5-7 servings of vegetables and fruits per day (fresh or steamed is best), exp. 2 servings of vegetables with lunch and dinner and 2 servings of fruit per day. Berries and greens such as kale and collards are great choices.  -consume on a regular basis: whole grains (make sure first ingredient on label contains the word "whole"), fresh fruits, fish, nuts, seeds, healthy oils (such as olive oil, avocado oil, grape seed oil) -may eat small amounts of dairy and lean meat on occasion, but avoid processed meats such as ham, bacon, lunch meat, etc. -drink water -try to avoid fast food and pre-packaged foods, processed meat -most experts advise limiting sodium to < 2300mg  per day, should limit further is any chronic conditions such as high blood pressure, heart disease, diabetes, etc. The American Heart Association advised that < 1500mg  is is ideal -try to avoid foods that contain any ingredients with names you do not recognize  -try to avoid sugar/sweets (except for the natural sugar that occurs in fresh fruit) -try to avoid sweet drinks -try to avoid white rice, white bread, pasta (unless whole grain), white  or yellow potatoes  EXERCISE GUIDELINES FOR ADULTS: -if you wish to increase your physical activity, do so gradually and with the approval of your doctor -STOP and seek medical care immediately if you have any chest pain, chest discomfort or trouble breathing when starting or increasing exercise  -move and stretch your body, legs, feet and arms when sitting for long periods -Physical activity guidelines for optimal health in adults: -least 150 minutes per week of aerobic exercise (can talk, but not sing) once approved by your doctor, 20-30 minutes of sustained activity or two 10 minute episodes of sustained activity every day.  -resistance training at least 2 days per week if approved by your doctor -balance exercises 3+ days per week:   Stand somewhere where  you have something sturdy to hold onto if you lose balance.    1) lift up on toes, start with 5x per day and work up to 20x   2) stand and lift on leg straight out to the side so that foot is a few inches of the floor, start with 5x each side and work up to 20x each side   3) stand on one foot, start with 5 seconds each side and work up to 20 seconds on each side  If you need ideas or help with getting more active:  -Silver sneakers https://tools.silversneakers.com  -Walk with a Doc: http://www.duncan-williams.com/  -try to include resistance (weight lifting/strength building) and balance exercises twice per week: or the following link for ideas: http://castillo-powell.com/  BuyDucts.dk  STRESS MANAGEMENT: -can try meditating, or just sitting quietly with deep breathing while intentionally relaxing all parts of your body for 5 minutes daily -if you need further help with stress, anxiety or depression please follow up with your primary doctor or contact the wonderful folks at [facility]: [phone]  SOCIAL CONNECTIONS: -options in [place] if you wish to engage in more social and exercise related activities:  -Silver sneakers https://tools.silversneakers.com  -Walk with a Doc: http://www.duncan-williams.com/  -Check out the [facility] Active Adults 50+ section on the [place] [employer] (hiking clubs, book clubs, cards and games, chess, exercise classes, aquatic classes and much more) - see the website for details: https://www.[place]-.gov/departments/parks-recreation/active-adults50  -YouTube has lots of exercise videos for different ages and abilities as well  -[facility] (a variety of indoor and outdoor inperson activities for adults). 616-132-2701. [address].  -Virtual Online Classes (a variety of topics): see seniorplanet.org or call  716-824-8458  -consider volunteering at a school, hospice center, church, senior center or elsewhere           Terressa Koyanagi, DO

## 2022-06-08 NOTE — Patient Instructions (Addendum)
I really enjoyed getting to talk with you today! I am available on Tuesdays and Thursdays for virtual visits if you have any questions or concerns, or if I can be of any further assistance.   CHECKLIST FROM ANNUAL WELLNESS VISIT:  -Follow up (please call to schedule if not scheduled after visit):   -09/13/22 @ 8:00am for physical exam with Dr. Russella Dar   -yearly for annual wellness visit with primary care office  Here is a list of your preventive care/health maintenance measures and the plan for each if any are due:  PLAN For any measures below that may be due:  -can get labs at your scheduled physical with Dr. Russella Dar this summer -can get the covid19 booster at the pharmacy  Health Maintenance  Topic Date Due   COVID-19 Vaccine (3 - Moderna risk series) 08/03/2019   Diabetic kidney evaluation - Urine ACR  03/24/2021   OPHTHALMOLOGY EXAM  03/02/2022   HEMOGLOBIN A1C  03/12/2022   Diabetic kidney evaluation - eGFR measurement  05/19/2022   INFLUENZA VACCINE  08/31/2022   FOOT EXAM  09/03/2022   MAMMOGRAM  12/17/2022   Medicare Annual Wellness (AWV)  06/08/2023   COLONOSCOPY (Pts 45-13yrs Insurance coverage will need to be confirmed)  08/04/2024   DTaP/Tdap/Td (2 - Tdap) 07/20/2026   Pneumonia Vaccine 17+ Years old  Completed   DEXA SCAN  Completed   Hepatitis C Screening  Completed   Zoster Vaccines- Shingrix  Completed   HPV VACCINES  Aged Out    -See a dentist at least yearly  -Get your eyes checked and then per your eye specialist's recommendations  -Other issues addressed today:   -I have included below further information regarding a healthy whole foods based diet, physical activity guidelines for adults, stress management and opportunities for social connections. I hope you find this information useful.    -----------------------------------------------------------------------------------------------------------------------------------------------------------------------------------------------------------------------------------------------------------  NUTRITION: -eat real food: lots of colorful vegetables (half the plate) and fruits -5-7 servings of vegetables and fruits per day (fresh or steamed is best), exp. 2 servings of vegetables with lunch and dinner and 2 servings of fruit per day. Berries and greens such as kale and collards are great choices.  -consume on a regular basis: whole grains (make sure first ingredient on label contains the word "whole"), fresh fruits, fish, nuts, seeds, healthy oils (such as olive oil, avocado oil, grape seed oil) -may eat small amounts of dairy and lean meat on occasion, but avoid processed meats such as ham, bacon, lunch meat, etc. -drink water -try to avoid fast food and pre-packaged foods, processed meat -most experts advise limiting sodium to < 2300mg  per day, should limit further is any chronic conditions such as high blood pressure, heart disease, diabetes, etc. The American Heart Association advised that < 1500mg  is is ideal -try to avoid foods that contain any ingredients with names you do not recognize  -try to avoid sugar/sweets (except for the natural sugar that occurs in fresh fruit) -try to avoid sweet drinks -try to avoid white rice, white bread, pasta (unless whole grain), white or yellow potatoes  EXERCISE GUIDELINES FOR ADULTS: -if you wish to increase your physical activity, do so gradually and with the approval of your doctor -STOP and seek medical care immediately if you have any chest pain, chest discomfort or trouble breathing when starting or increasing exercise  -move and stretch your body, legs, feet and arms when sitting for long periods -Physical activity guidelines for optimal health in adults: -least 150  minutes per week of  aerobic exercise (can talk, but not sing) once approved by your doctor, 20-30 minutes of sustained activity or two 10 minute episodes of sustained activity every day.  -resistance training at least 2 days per week if approved by your doctor -balance exercises 3+ days per week:   Stand somewhere where you have something sturdy to hold onto if you lose balance.    1) lift up on toes, start with 5x per day and work up to 20x   2) stand and lift on leg straight out to the side so that foot is a few inches of the floor, start with 5x each side and work up to 20x each side   3) stand on one foot, start with 5 seconds each side and work up to 20 seconds on each side  If you need ideas or help with getting more active:  -Silver sneakers https://tools.silversneakers.com  -Walk with a Doc: http://www.duncan-williams.com/  -try to include resistance (weight lifting/strength building) and balance exercises twice per week: or the following link for ideas: http://castillo-powell.com/  BuyDucts.dk  STRESS MANAGEMENT: -can try meditating, or just sitting quietly with deep breathing while intentionally relaxing all parts of your body for 5 minutes daily -if you need further help with stress, anxiety or depression please follow up with your primary doctor or contact the wonderful folks at [facility]: [phone]  SOCIAL CONNECTIONS: -options in [place] if you wish to engage in more social and exercise related activities:  -Silver sneakers https://tools.silversneakers.com  -Walk with a Doc: http://www.duncan-williams.com/  -Check out the [facility] Active Adults 50+ section on the [place] of [employer] (hiking clubs, book clubs, cards and games, chess, exercise classes, aquatic classes and much more) - see the website for  details: https://www.[place].gov/departments/parks-recreation/active-adults50  -YouTube has lots of exercise videos for different ages and abilities as well  -[facility] (a variety of indoor and outdoor inperson activities for adults). ([phone]. [address]  -Virtual Online Classes (a variety of topics): see seniorplanet.org or call [phone]  -consider volunteering at a school, hospice center, church, senior center or elsewhere

## 2022-06-14 ENCOUNTER — Other Ambulatory Visit: Payer: Self-pay | Admitting: Podiatry

## 2022-06-21 ENCOUNTER — Ambulatory Visit (INDEPENDENT_AMBULATORY_CARE_PROVIDER_SITE_OTHER): Payer: Medicare Other | Admitting: Podiatry

## 2022-06-21 DIAGNOSIS — M79675 Pain in left toe(s): Secondary | ICD-10-CM | POA: Diagnosis not present

## 2022-06-21 DIAGNOSIS — B351 Tinea unguium: Secondary | ICD-10-CM | POA: Diagnosis not present

## 2022-06-21 DIAGNOSIS — M79674 Pain in right toe(s): Secondary | ICD-10-CM

## 2022-06-21 MED ORDER — GABAPENTIN 100 MG PO CAPS: 100.0000 mg | ORAL_CAPSULE | Freq: Three times a day (TID) | ORAL | 3 refills | Status: DC

## 2022-06-21 NOTE — Progress Notes (Signed)
  Subjective:  Patient ID: Renee Bauer, female    DOB: 08/01/1950,  MRN: [id number]  Chief Complaint  Patient presents with   Nail Problem    Nail trim    71 y.o. female returns for the above complaint.  Patient presents with thickened elongated dystrophic toenails x10.  Pain full to touch.  She is a diabetic with last A1c of 5.7.  She would like to have her nails debrided down.  She denies any other acute complaints.  Objective:  There were no vitals filed for this visit. Podiatric Exam: Vascular: dorsalis pedis and posterior tibial pulses are palpable bilateral. Capillary return is immediate. Temperature gradient is WNL. Skin turgor WNL  Sensorium: Normal Semmes Weinstein monofilament test. Normal tactile sensation bilaterally. Nail Exam: Pt has thick disfigured discolored nails with subungual debris noted bilateral entire nail hallux through fifth toenails.  Except left second digit due to previous total nail avulsion.  Pain on palpation to the nails. Ulcer Exam: There is no evidence of ulcer or pre-ulcerative changes or infection. Orthopedic Exam: Muscle tone and strength are WNL. No limitations in general ROM. No crepitus or effusions noted. HAV  B/L.  Hammer toes 2-5  B/L. Skin: No Porokeratosis. No infection or ulcers    Assessment & Plan:   No diagnosis found.     Patient was evaluated and treated and all questions answered.  Onychomycosis with pain  -Nails palliatively debrided as below. -Educated on self-care  Procedure: Nail Debridement Rationale: pain  Type of Debridement: manual, sharp debridement. Instrumentation: Nail nipper, rotary burr. Number of Nails: 9  Procedures and Treatment: Consent by patient was obtained for treatment procedures. The patient understood the discussion of treatment and procedures well. All questions were answered thoroughly reviewed. Debridement of mycotic and hypertrophic toenails, 1 through 5 bilateral and clearing of subungual  debris. No ulceration, no infection noted.  Return Visit-Office Procedure: Patient instructed to return to the office for a follow up visit 3 months for continued evaluation and treatment.  Micharl Helmes, DPM    No follow-ups on file. 

## 2022-07-05 ENCOUNTER — Encounter: Payer: Self-pay | Admitting: Family Medicine

## 2022-07-05 DIAGNOSIS — F419 Anxiety disorder, unspecified: Secondary | ICD-10-CM

## 2022-07-06 MED ORDER — HYDROXYZINE PAMOATE 25 MG PO CAPS: 25.0000 mg | ORAL_CAPSULE | Freq: Three times a day (TID) | ORAL | 0 refills | Status: DC | PRN

## 2022-07-11 NOTE — Progress Notes (Unsigned)
[name] [address] [place] [address] Phone: [phone] Subjective:   Renee Bauer, am serving as a scribe for Dr. Antoine Primas.  I'm seeing this patient by the request  of:  Karie Georges, MD  CC: Bilateral hip pain  BJY:[id number]  05/10/2022 Patient does have some fat atrophy from previous injection.  This is in the pes anserine area.  I think that this is causing more of the bruising in the area.  Discussed Arnica gel, home exercises and icing regimen.  Discussed potential compression if necessary.  Patient still feels of feeling of instability of the knee and will continue to wear a brace on a regular basis.  Follow-up with me again 2 to 3 months otherwise total time reviewing outside records including rheumatology as well as discussing with patient 31 minutes     Patient has had difficulty with the hips, with patient's recent sacroiliac joint injection and would like to pause and not give her 1 to see how much that she gets better from this.  I do think that there is a possibility of [place] repeating this though if necessary.  We discussed icing regimen and home exercises still, proper shoes, continue to monitor.     Update 07/12/2022 Renee Bauer is a 72 y.o. female coming in with complaint of B hip and L knee pain. Patient states that her L knee is the same as last visit. Still having hard time kneeling on knee. Bruising occurring intermittently.   Would like      Past Medical History:  Diagnosis Date   Anxiety    Arthritis    Benign colon polyp 2018   Bursitis    Depression    Diabetes mellitus without complication (HCC)    GERD (gastroesophageal reflux disease)    Hyperlipidemia    Rosacea    Past Surgical History:  Procedure Laterality Date   ABDOMINAL HYSTERECTOMY     menorrhagia   BLADDER REPAIR     BREAST BIOPSY Left    BUNIONECTOMY Right    COLON RESECTION  2007   COLONOSCOPY     DILATION AND CURETTAGE OF  UTERUS     TOTAL KNEE ARTHROPLASTY Left 09/29/2020   Social History   Socioeconomic History   Marital status: Married    Spouse name: Not on file   Number of children: 2   Years of education: Not on file   Highest education level: Associate degree: occupational, Scientist, product/process development, or vocational program  Occupational History   Occupation: Retired  Tobacco Use   Smoking status: Never   Smokeless tobacco: Never  Vaping Use   Vaping Use: Never used  Substance and Sexual Activity   Alcohol use: Yes    Comment: occasional wine   Drug use: Never   Sexual activity: Not Currently  Other Topics Concern   Not on file  Social History Narrative   Not on file   Social Determinants of Health   Financial Resource Strain: Low Risk  (01/09/2022)   Overall Financial Resource Strain (CARDIA)    Difficulty of Paying Living Expenses: Not hard at all  Food Insecurity: No Food Insecurity (01/09/2022)   Hunger Vital Sign    Worried About Running Out of Food in the Last Year: Never true    Ran Out of Food in the Last Year: Never true  Transportation Needs: No Transportation Needs (01/09/2022)   PRAPARE - Administrator, Civil Service (Medical): No  Lack of Transportation (Non-Medical): No  Physical Activity: Sufficiently Active (01/09/2022)   Exercise Vital Sign    Days of Exercise per Week: 4 days    Minutes of Exercise per Session: 60 min  Stress: Stress Concern Present (01/09/2022)   [employer] of Occupational Health - Occupational Stress Questionnaire    Feeling of Stress : Rather much  Social Connections: Socially Integrated (01/09/2022)   Social Connection and Isolation Panel [NHANES]    Frequency of Communication with Friends and Family: Once a week    Frequency of Social Gatherings with Friends and Family: Twice a week    Attends Religious Services: More than 4 times per year    Active Member of [employer] or Organizations: Yes    Attends Engineer, structural: Not  on file    Marital Status: Married   Allergies  Allergen Reactions   Tramadol     Felt sick with this   Wellbutrin [Bupropion]     Muscle twitches   Clindamycin Hcl Rash   Family History  Problem Relation Age of Onset   Diabetes Mother    Arrhythmia Mother    High blood pressure Mother    Mesothelioma Father        asbestos exposure   Allergic rhinitis Father    Sinusitis Father    Healthy Sister    Post-traumatic stress disorder Son    Scoliosis Son    Brain cancer Maternal Aunt    Asthma Neg Hx    Eczema Neg Hx    Urticaria Neg Hx    Angioedema Neg Hx    Immunodeficiency Neg Hx    Atopy Neg Hx    Colon cancer Neg Hx    Esophageal cancer Neg Hx    Stomach cancer Neg Hx    Colon polyps Neg Hx      Current Outpatient Medications (Cardiovascular):    rosuvastatin (CRESTOR) 10 MG tablet, Take 1 tablet (10 mg total) by mouth at bedtime.  Current Outpatient Medications (Respiratory):    fluticasone (FLONASE) 50 MCG/ACT nasal spray, Place 2 sprays into both nostrils daily.   montelukast (SINGULAIR) 10 MG tablet, Take 1 tablet (10 mg total) by mouth at bedtime.  Current Outpatient Medications (Analgesics):    meloxicam (MOBIC) 7.5 MG tablet, Take 1 tablet (7.5 mg total) by mouth daily.  Current Outpatient Medications (Hematological):    vitamin B-12 (CYANOCOBALAMIN) 1000 MCG tablet, Take 1,000 mcg by mouth daily.  Current Outpatient Medications (Other):    CALCIUM CITRATE PO, Take 600 mg by mouth daily.   Cholecalciferol (VITAMIN D3 PO), Take 2,000 Units by mouth daily.   esomeprazole (NEXIUM) 40 MG capsule, TAKE 1 CAPSULE DAILY   gabapentin (NEURONTIN) 100 MG capsule, TAKE 1 CAPSULE THREE TIMES A DAY   gabapentin (NEURONTIN) 100 MG capsule, Take 1 capsule (100 mg total) by mouth 3 (three) times daily.   hydrocortisone (ANUSOL-HC) 2.5 % rectal cream, Place 1 Application rectally 2 (two) times daily. (Patient taking differently: Place 1 Application rectally as  needed.)   hydrOXYzine (VISTARIL) 25 MG capsule, Take 1 capsule (25 mg total) by mouth every 8 (eight) hours as needed.   lidocaine-prilocaine (EMLA) cream, APPLY 1 APPLICATION TOPICALLY AS NEEDED   methocarbamol (ROBAXIN) 500 MG tablet, Take 1 tablet (500 mg total) by mouth every 8 (eight) hours as needed for muscle spasms.   METRONIDAZOLE, TOPICAL, 0.75 % LOTN, as needed.    OVER THE COUNTER MEDICATION, Zeasorb powder   polyethylene glycol (MIRALAX) 17 g  packet, Take 17 g by mouth daily.   TERBINAFINE EX, Apply topically.   zolpidem (AMBIEN CR) 6.25 MG CR tablet, Take 1 tablet (6.25 mg total) by mouth at bedtime as needed for sleep.   Reviewed prior external information including notes and imaging from  primary care provider As well as notes that were available from care everywhere and other healthcare systems.  Past medical history, social, surgical and family history all reviewed in electronic medical record.  No pertanent information unless stated regarding to the chief complaint.   Review of Systems:  No headache, visual changes, nausea, vomiting, diarrhea, constipation, dizziness, abdominal pain, skin rash, fevers, chills, night sweats, weight loss, swollen lymph nodes, joint swelling, chest pain, shortness of breath, mood changes. POSITIVE muscle aches, body aches  Objective  Blood pressure 112/80, pulse 82, height 5\' 1"  (1.549 m), weight 152 lb (68.9 kg), SpO2 98 %.   General: No apparent distress alert and oriented x3 mood and affect normal, dressed appropriately.  HEENT: Pupils equal, extraocular movements intact  Respiratory: Patient's speak in full sentences and does not appear short of breath  Cardiovascular: No lower extremity edema, non tender, no erythema  Bilateral hands do have significant discomfort noted.  Tenderness over the greater trochanteric area bilaterally.  Patient is having tenderness over the sacroiliac joints bilaterally as well.   After verbal consent  patient was prepped with alcohol swab and with a 21-gauge 2 inch needle injected into the right greater trochanteric area with 2 cc of 0.5% Marcaine and 1 cc of Kenalog 40 mg/mL.  No blood loss.  Band-Aid placed.  Postinjection instructions given    After verbal consent patient was prepped with alcohol swab and with a 21-gauge 2 inch needle injected into the left  greater trochanteric area with 2 cc of 0.5% Marcaine and 1 cc of Kenalog 40 mg/mL.  No blood loss.  Band-Aid placed.  Postinjection instructions given   Impression and Recommendations:     The above documentation has been reviewed and is accurate and complete Judi Saa, DO

## 2022-07-12 ENCOUNTER — Ambulatory Visit (INDEPENDENT_AMBULATORY_CARE_PROVIDER_SITE_OTHER): Payer: Medicare Other | Admitting: Family Medicine

## 2022-07-12 ENCOUNTER — Encounter: Payer: Self-pay | Admitting: Family Medicine

## 2022-07-12 VITALS — BP 112/80 | HR 82 | Ht 61.0 in | Wt 152.0 lb

## 2022-07-12 DIAGNOSIS — M7061 Trochanteric bursitis, right hip: Secondary | ICD-10-CM | POA: Diagnosis not present

## 2022-07-12 DIAGNOSIS — M7062 Trochanteric bursitis, left hip: Secondary | ICD-10-CM | POA: Diagnosis not present

## 2022-07-12 NOTE — Patient Instructions (Addendum)
Injected both hips today Continue working on scar tissue with [place] tool See me again in 2 months

## 2022-07-12 NOTE — Assessment & Plan Note (Signed)
Repeat injections for this chronic problem with worsening symptoms.  Discussed the SI joint as well.  May need to consider injections at follow-up discussed which activities to do and which ones to avoid.  Increase activity slowly.  Follow-up again in 3 months patient will continue to work on hip abductor strengthening.

## 2022-07-21 ENCOUNTER — Other Ambulatory Visit: Payer: Self-pay | Admitting: Family Medicine

## 2022-07-21 DIAGNOSIS — E1169 Type 2 diabetes mellitus with other specified complication: Secondary | ICD-10-CM

## 2022-08-02 ENCOUNTER — Other Ambulatory Visit: Payer: Medicare Other

## 2022-08-07 ENCOUNTER — Encounter: Payer: Self-pay | Admitting: Family Medicine

## 2022-08-30 ENCOUNTER — Encounter (INDEPENDENT_AMBULATORY_CARE_PROVIDER_SITE_OTHER): Payer: Self-pay

## 2022-09-04 ENCOUNTER — Other Ambulatory Visit: Payer: Self-pay | Admitting: Family Medicine

## 2022-09-04 DIAGNOSIS — K219 Gastro-esophageal reflux disease without esophagitis: Secondary | ICD-10-CM

## 2022-09-06 ENCOUNTER — Ambulatory Visit (INDEPENDENT_AMBULATORY_CARE_PROVIDER_SITE_OTHER): Payer: Medicare Other | Admitting: Podiatry

## 2022-09-06 DIAGNOSIS — M79674 Pain in right toe(s): Secondary | ICD-10-CM

## 2022-09-06 DIAGNOSIS — M79675 Pain in left toe(s): Secondary | ICD-10-CM

## 2022-09-06 DIAGNOSIS — B351 Tinea unguium: Secondary | ICD-10-CM

## 2022-09-06 NOTE — Progress Notes (Signed)
  Subjective:  Patient ID: Renee Bauer, female    DOB: February 04, 1950,  MRN: [id number]  Chief Complaint  Patient presents with   Nail Problem   72 y.o. female returns for the above complaint.  Patient presents with thickened elongated dystrophic toenails x10.  Pain full to touch.  She is a diabetic with last A1c of 5.7.  She would like to have her nails debrided down.  She denies any other acute complaints.  Objective:  There were no vitals filed for this visit. Podiatric Exam: Vascular: dorsalis pedis and posterior tibial pulses are palpable bilateral. Capillary return is immediate. Temperature gradient is WNL. Skin turgor WNL  Sensorium: Normal Semmes Weinstein monofilament test. Normal tactile sensation bilaterally. Nail Exam: Pt has thick disfigured discolored nails with subungual debris noted bilateral entire nail hallux through fifth toenails.  Except left second digit due to previous total nail avulsion.  Pain on palpation to the nails. Ulcer Exam: There is no evidence of ulcer or pre-ulcerative changes or infection. Orthopedic Exam: Muscle tone and strength are WNL. No limitations in general ROM. No crepitus or effusions noted. HAV  B/L.  Hammer toes 2-5  B/L. Skin: No Porokeratosis. No infection or ulcers    Assessment & Plan:   1. Pain due to onychomycosis of toenails of both feet        Patient was evaluated and treated and all questions answered.  Onychomycosis with pain  -Nails palliatively debrided as below. -Educated on self-care  Procedure: Nail Debridement Rationale: pain  Type of Debridement: manual, sharp debridement. Instrumentation: Nail nipper, rotary burr. Number of Nails: 9  Procedures and Treatment: Consent by patient was obtained for treatment procedures. The patient understood the discussion of treatment and procedures well. All questions were answered thoroughly reviewed. Debridement of mycotic and hypertrophic toenails, 1 through 5 bilateral and  clearing of subungual debris. No ulceration, no infection noted.  Return Visit-Office Procedure: Patient instructed to return to the office for a follow up visit 3 months for continued evaluation and treatment.  Nicholes Rough, DPM    No follow-ups on file.

## 2022-09-13 ENCOUNTER — Ambulatory Visit: Payer: Medicare Other | Admitting: Family Medicine

## 2022-09-13 ENCOUNTER — Encounter: Payer: Medicare Other | Admitting: Family Medicine

## 2022-10-27 ENCOUNTER — Other Ambulatory Visit: Payer: Self-pay | Admitting: Family Medicine

## 2022-10-27 DIAGNOSIS — F419 Anxiety disorder, unspecified: Secondary | ICD-10-CM

## 2022-11-02 ENCOUNTER — Other Ambulatory Visit: Payer: Self-pay | Admitting: Family Medicine

## 2022-11-02 DIAGNOSIS — Z1231 Encounter for screening mammogram for malignant neoplasm of breast: Secondary | ICD-10-CM

## 2022-11-06 NOTE — Progress Notes (Unsigned)
[name] [address] [place] [address] Phone: [phone] Subjective:   Bruce Donath, am serving as a scribe for Dr. Antoine Primas.  I'm seeing this patient by the request  of:  Karie Georges, MD  CC: Bilateral hip pain  [id number]  07/12/2022 Repeat injections for this chronic problem with worsening symptoms.  Discussed the SI joint as well.  May need to consider injections at follow-up discussed which activities to do and which ones to avoid.  Increase activity slowly.  Follow-up again in 3 months patient will continue to work on hip abductor strengthening.      Update 11/07/2022 Renee Bauer is a 72 y.o. female coming in with complaint of B hip pain. Patient states that she would ike injections today. Injections in June provided her with relief up until 2 weeks ago.        Past Medical History:  Diagnosis Date   Anxiety    Arthritis    Benign colon polyp 2018   Bursitis    Depression    Diabetes mellitus without complication (HCC)    GERD (gastroesophageal reflux disease)    Hyperlipidemia    Rosacea    Past Surgical History:  Procedure Laterality Date   ABDOMINAL HYSTERECTOMY     menorrhagia   BLADDER REPAIR     BREAST BIOPSY Left    BUNIONECTOMY Right    COLON RESECTION  2007   COLONOSCOPY     DILATION AND CURETTAGE OF UTERUS     TOTAL KNEE ARTHROPLASTY Left 09/29/2020   Social History   Socioeconomic History   Marital status: Married    Spouse name: Not on file   Number of children: 2   Years of education: Not on file   Highest education level: Associate degree: occupational, Scientist, product/process development, or vocational program  Occupational History   Occupation: Retired  Tobacco Use   Smoking status: Never   Smokeless tobacco: Never  Vaping Use   Vaping status: Never Used  Substance and Sexual Activity   Alcohol use: Yes    Comment: occasional wine   Drug use: Never   Sexual activity: Not Currently  Other  Topics Concern   Not on file  Social History Narrative   Not on file   Social Determinants of Health   Financial Resource Strain: Low Risk  (01/09/2022)   Overall Financial Resource Strain (CARDIA)    Difficulty of Paying Living Expenses: Not hard at all  Food Insecurity: No Food Insecurity (01/09/2022)   Hunger Vital Sign    Worried About Running Out of Food in the Last Year: Never true    Ran Out of Food in the Last Year: Never true  Transportation Needs: No Transportation Needs (01/09/2022)   PRAPARE - Administrator, Civil Service (Medical): No    Lack of Transportation (Non-Medical): No  Physical Activity: Sufficiently Active (01/09/2022)   Exercise Vital Sign    Days of Exercise per Week: 4 days    Minutes of Exercise per Session: 60 min  Stress: Stress Concern Present (01/09/2022)   [employer] of Occupational Health - Occupational Stress Questionnaire    Feeling of Stress : Rather much  Social Connections: Socially Integrated (01/09/2022)   Social Connection and Isolation Panel [NHANES]    Frequency of Communication with Friends and Family: Once a week    Frequency of Social Gatherings with Friends and Family: Twice a week    Attends Religious Services: More than  4 times per year    Active Member of Clubs or Organizations: Yes    Attends Banker Meetings: Not on file    Marital Status: Married   Allergies  Allergen Reactions   Tramadol     Felt sick with this   Wellbutrin [Bupropion]     Muscle twitches   Clindamycin Hcl Rash   Family History  Problem Relation Age of Onset   Diabetes Mother    Arrhythmia Mother    High blood pressure Mother    Mesothelioma Father        asbestos exposure   Allergic rhinitis Father    Sinusitis Father    Healthy Sister    Post-traumatic stress disorder Son    Scoliosis Son    Brain cancer Maternal Aunt    Asthma Neg Hx    Eczema Neg Hx    Urticaria Neg Hx    Angioedema Neg Hx     Immunodeficiency Neg Hx    Atopy Neg Hx    Colon cancer Neg Hx    Esophageal cancer Neg Hx    Stomach cancer Neg Hx    Colon polyps Neg Hx      Current Outpatient Medications (Cardiovascular):    rosuvastatin (CRESTOR) 10 MG tablet, TAKE 1 TABLET AT BEDTIME  Current Outpatient Medications (Respiratory):    fluticasone (FLONASE) 50 MCG/ACT nasal spray, Place 2 sprays into both nostrils daily.   montelukast (SINGULAIR) 10 MG tablet, Take 1 tablet (10 mg total) by mouth at bedtime.  Current Outpatient Medications (Analgesics):    meloxicam (MOBIC) 7.5 MG tablet, Take 1 tablet (7.5 mg total) by mouth daily.  Current Outpatient Medications (Hematological):    vitamin B-12 (CYANOCOBALAMIN) 1000 MCG tablet, Take 1,000 mcg by mouth daily.  Current Outpatient Medications (Other):    CALCIUM CITRATE PO, Take 600 mg by mouth daily.   esomeprazole (NEXIUM) 40 MG capsule, TAKE 1 CAPSULE DAILY   gabapentin (NEURONTIN) 100 MG capsule, TAKE 1 CAPSULE THREE TIMES A DAY   gabapentin (NEURONTIN) 100 MG capsule, Take 1 capsule (100 mg total) by mouth 3 (three) times daily.   lidocaine-prilocaine (EMLA) cream, APPLY 1 APPLICATION TOPICALLY AS NEEDED   methocarbamol (ROBAXIN) 500 MG tablet, Take 1 tablet (500 mg total) by mouth every 8 (eight) hours as needed for muscle spasms.   OVER THE COUNTER MEDICATION, Zeasorb powder   zolpidem (AMBIEN CR) 6.25 MG CR tablet, Take 1 tablet (6.25 mg total) by mouth at bedtime as needed for sleep.   Cholecalciferol (VITAMIN D3 PO), Take 2,000 Units by mouth daily.   hydrocortisone (ANUSOL-HC) 2.5 % rectal cream, Place 1 Application rectally 2 (two) times daily. (Patient taking differently: Place 1 Application rectally as needed.)   hydrOXYzine (VISTARIL) 25 MG capsule, Take 1 capsule (25 mg total) by mouth every 8 (eight) hours as needed.   METRONIDAZOLE, TOPICAL, 0.75 % LOTN, as needed.    polyethylene glycol (MIRALAX) 17 g packet, Take 17 g by mouth daily.    TERBINAFINE EX, Apply topically.   Reviewed prior external information including notes and imaging from  primary care provider As well as notes that were available from care everywhere and other healthcare systems.  Past medical history, social, surgical and family history all reviewed in electronic medical record.  No pertanent information unless stated regarding to the chief complaint.   Review of Systems:  No headache, visual changes, nausea, vomiting, diarrhea, constipation, dizziness, abdominal pain, skin rash, fevers, chills, night sweats, weight loss,  swollen lymph nodes, body aches, joint swelling, chest pain, shortness of breath, mood changes. POSITIVE muscle aches  Objective  Blood pressure 116/76, pulse 84, height 5\' 1"  (1.549 m), weight 152 lb (68.9 kg), SpO2 98%.   General: No apparent distress alert and oriented x3 mood and affect normal, dressed appropriately.  HEENT: Pupils equal, extraocular movements intact  Respiratory: Patient's speak in full sentences and does not appear short of breath  Cardiovascular: No lower extremity edema, non tender, no erythema  Back does have loss lordosis noted.  Severe tenderness to palpation over the greater trochanteric area left greater than right.  Some time redness also noted with straight leg test.  Worsening pain though with FABER test bilaterally over the greater trochanteric area.   Procedure: Real-time Ultrasound Guided Injection of right greater trochanteric bursitis secondary to patient's body habitus Device: GE Logiq Q7 Ultrasound guided injection is preferred based studies that show increased duration, increased effect, greater accuracy, decreased procedural pain, increased response rate, and decreased cost with ultrasound guided versus blind injection.  Verbal informed consent obtained.  Time-out conducted.  Noted no overlying erythema, induration, or other signs of local infection.  Skin prepped in a sterile fashion.  Local  anesthesia: Topical Ethyl chloride.  With sterile technique and under real time ultrasound guidance:  Greater trochanteric area was visualized and patient's bursa was noted. A 22-gauge 3 inch needle was inserted and 4 cc of 0.5% Marcaine and 1 cc of Kenalog 40 mg/dL was injected. Pictures taken Completed without difficulty  Pain immediately resolved suggesting accurate placement of the medication.  Advised to call if fevers/chills, erythema, induration, drainage, or persistent bleeding.  Impression: Technically successful ultrasound guided injection.   Procedure: Real-time Ultrasound Guided Injection of left  greater trochanteric bursitis secondary to patient's body habitus Device: GE Logiq Q7  Ultrasound guided injection is preferred based studies that show increased duration, increased effect, greater accuracy, decreased procedural pain, increased response rate, and decreased cost with ultrasound guided versus blind injection.  Verbal informed consent obtained.  Time-out conducted.  Noted no overlying erythema, induration, or other signs of local infection.  Skin prepped in a sterile fashion.  Local anesthesia: Topical Ethyl chloride.  With sterile technique and under real time ultrasound guidance:  Greater trochanteric area was visualized and patient's bursa was noted. A 22-gauge 3 inch needle was inserted and 4 cc of 0.5% Marcaine and 1 cc of Kenalog 40 mg/dL was injected. Pictures taken Completed without difficulty  Pain immediately resolved suggesting accurate placement of the medication.  Advised to call if fevers/chills, erythema, induration, drainage, or persistent bleeding. .  Impression: Technically successful ultrasound guided injection.     Impression and Recommendations:

## 2022-11-07 ENCOUNTER — Ambulatory Visit: Payer: Medicare Other | Admitting: Family Medicine

## 2022-11-07 ENCOUNTER — Other Ambulatory Visit: Payer: Self-pay

## 2022-11-07 ENCOUNTER — Encounter: Payer: Self-pay | Admitting: Family Medicine

## 2022-11-07 VITALS — BP 116/76 | HR 84 | Ht 61.0 in | Wt 152.0 lb

## 2022-11-07 DIAGNOSIS — M25552 Pain in left hip: Secondary | ICD-10-CM | POA: Diagnosis not present

## 2022-11-07 DIAGNOSIS — M25551 Pain in right hip: Secondary | ICD-10-CM | POA: Diagnosis not present

## 2022-11-07 DIAGNOSIS — M7062 Trochanteric bursitis, left hip: Secondary | ICD-10-CM

## 2022-11-07 DIAGNOSIS — M7061 Trochanteric bursitis, right hip: Secondary | ICD-10-CM

## 2022-11-07 NOTE — Patient Instructions (Addendum)
Hip injections today See me again in 3 months

## 2022-11-07 NOTE — Assessment & Plan Note (Signed)
Chronic, with worsening symptoms.  Still concerned that it is potentially coming from the radicular symptoms of the back.  Discussed icing regimen and home exercises, discussed which activities to do and which ones to avoid.  Increase activity slowly over the course of next several weeks.  Follow-up again in 6 to 8 weeks.  Follow-up with me again in 112 weeks.  Work with Event organiser to learn hip abductor strengthening exercises.

## 2022-11-15 ENCOUNTER — Encounter: Payer: Self-pay | Admitting: Family Medicine

## 2022-11-15 ENCOUNTER — Other Ambulatory Visit: Payer: Self-pay | Admitting: Podiatry

## 2022-11-15 DIAGNOSIS — G4709 Other insomnia: Secondary | ICD-10-CM

## 2022-11-17 MED ORDER — ZOLPIDEM TARTRATE ER 6.25 MG PO TBCR: 6.2500 mg | EXTENDED_RELEASE_TABLET | Freq: Every evening | ORAL | 0 refills | Status: AC | PRN

## 2022-11-22 ENCOUNTER — Ambulatory Visit (INDEPENDENT_AMBULATORY_CARE_PROVIDER_SITE_OTHER): Payer: Medicare Other | Admitting: Podiatry

## 2022-11-22 ENCOUNTER — Encounter: Payer: Self-pay | Admitting: Podiatry

## 2022-11-22 DIAGNOSIS — E1142 Type 2 diabetes mellitus with diabetic polyneuropathy: Secondary | ICD-10-CM | POA: Diagnosis not present

## 2022-11-22 DIAGNOSIS — B351 Tinea unguium: Secondary | ICD-10-CM | POA: Diagnosis not present

## 2022-11-22 DIAGNOSIS — M79675 Pain in left toe(s): Secondary | ICD-10-CM | POA: Diagnosis not present

## 2022-11-22 DIAGNOSIS — M79674 Pain in right toe(s): Secondary | ICD-10-CM | POA: Diagnosis not present

## 2022-11-22 NOTE — Progress Notes (Signed)
Subjective:  Patient ID: Renee Bauer, female    DOB: 1950/07/03,  MRN: [id number]  Chief Complaint  Patient presents with   Routine Post Op    PATIENT STATES THAT SHE HAS BEEN DOING WELL, HER LEFT FOOT 2ND TOE HAS BEEN HURTING HER AND WANTS TO KNOW IF YOU CAN CUT IT . PATIENT STATES TAHT SHE IS NOT TAKING ANYTHING FOR PAIN   72 y.o. female returns for the above complaint.  Patient presents with thickened elongated dystrophic toenails x10.  Pain full to touch.  She is a diabetic with last A1c of 5.7.  She would like to have her nails debrided down.  She denies any other acute complaints.  Objective:  There were no vitals filed for this visit. Podiatric Exam: Vascular: dorsalis pedis and posterior tibial pulses are palpable bilateral. Capillary return is immediate. Temperature gradient is WNL. Skin turgor WNL  Sensorium: Normal Semmes Weinstein monofilament test. Normal tactile sensation bilaterally. Nail Exam: Pt has thick disfigured discolored nails with subungual debris noted bilateral entire nail hallux through fifth toenails.  Except left second digit due to previous total nail avulsion.  Pain on palpation to the nails. Ulcer Exam: There is no evidence of ulcer or pre-ulcerative changes or infection. Orthopedic Exam: Muscle tone and strength are WNL. No limitations in general ROM. No crepitus or effusions noted. HAV  B/L.  Hammer toes 2-5  B/L. Skin: No Porokeratosis. No infection or ulcers    Assessment & Plan:   No diagnosis found.      Patient was evaluated and treated and all questions answered.  Onychomycosis with pain  -Nails palliatively debrided as below. -Educated on self-care  Procedure: Nail Debridement Rationale: pain  Type of Debridement: manual, sharp debridement. Instrumentation: Nail nipper, rotary burr. Number of Nails: 9  Procedures and Treatment: Consent by patient was obtained for treatment procedures. The patient understood the discussion of  treatment and procedures well. All questions were answered thoroughly reviewed. Debridement of mycotic and hypertrophic toenails, 1 through 5 bilateral and clearing of subungual debris. No ulceration, no infection noted.  Return Visit-Office Procedure: Patient instructed to return to the office for a follow up visit 3 months for continued evaluation and treatment.  Nicholes Rough, DPM    No follow-ups on file.

## 2022-11-28 NOTE — Telephone Encounter (Signed)
I don't know why it was discontinued- could we call the patient and ask if she is taking this medication? Thanks.

## 2022-11-29 ENCOUNTER — Encounter: Payer: Self-pay | Admitting: Family Medicine

## 2022-11-29 NOTE — Telephone Encounter (Signed)
Left a message for the patient to return my call.  

## 2022-12-18 ENCOUNTER — Ambulatory Visit
Admission: RE | Admit: 2022-12-18 | Discharge: 2022-12-18 | Disposition: A | Discharge status: Home / Self Care | Payer: Medicare Other | Source: Ambulatory Visit | Attending: Family Medicine | Admitting: Family Medicine

## 2022-12-18 DIAGNOSIS — Z1231 Encounter for screening mammogram for malignant neoplasm of breast: Secondary | ICD-10-CM

## 2023-02-02 NOTE — Progress Notes (Signed)
 [facility] [address] [place] [address] Phone: [phone] Subjective:   Renee Bauer, am serving as a scribe for Dr. Arthea Claudene.  I'm seeing this patient by the request  of:  Cindy Clotilda HERO, DO  CC: Bilateral hip pain  [id number]  11/07/2022 Chronic, with worsening symptoms.  Still concerned that it is potentially coming from the radicular symptoms of the back.  Discussed icing regimen and home exercises, discussed which activities to do and which ones to avoid.  Increase activity slowly over the course of next several weeks.  Follow-up again in 6 to 8 weeks.  Follow-up with me again in 112 weeks.  Work with event organiser to learn hip abductor strengthening exercises.      Update 02/07/2023 Renee Bauer is a 73 y.o. female coming in with complaint of B hip pain. Patient states same old same old ready for injections. Patient states that her shoulders have started to bother her for about a few months worse in the morning just wanting to know if there are some exercises she can do.        Past Medical History:  Diagnosis Date   Anxiety    Arthritis    Benign colon polyp 2018   Bursitis    Depression    Diabetes mellitus without complication (HCC)    GERD (gastroesophageal reflux disease)    Hyperlipidemia    Rosacea    Past Surgical History:  Procedure Laterality Date   ABDOMINAL HYSTERECTOMY     menorrhagia   BLADDER REPAIR     BREAST BIOPSY Left    BUNIONECTOMY Right    COLON RESECTION  2007   COLONOSCOPY     DILATION AND CURETTAGE OF UTERUS     TOTAL KNEE ARTHROPLASTY Left 09/29/2020   Social History   Socioeconomic History   Marital status: Married    Spouse name: Not on file   Number of children: 2   Years of education: Not on file   Highest education level: Associate degree: occupational, scientist, product/process development, or vocational program  Occupational History   Occupation: Retired  Tobacco Use   Smoking status:  Never   Smokeless tobacco: Never  Vaping Use   Vaping status: Never Used  Substance and Sexual Activity   Alcohol use: Yes    Comment: occasional wine   Drug use: Never   Sexual activity: Not Currently  Other Topics Concern   Not on file  Social History Narrative   Not on file   Social Drivers of Health   Financial Resource Strain: Low Risk  (01/09/2022)   Overall Financial Resource Strain (CARDIA)    Difficulty of Paying Living Expenses: Not hard at all  Food Insecurity: No Food Insecurity (01/09/2022)   Hunger Vital Sign    Worried About Running Out of Food in the Last Year: Never true    Ran Out of Food in the Last Year: Never true  Transportation Needs: No Transportation Needs (01/09/2022)   PRAPARE - Administrator, Civil Service (Medical): No    Lack of Transportation (Non-Medical): No  Physical Activity: Sufficiently Active (01/09/2022)   Exercise Vital Sign    Days of Exercise per Week: 4 days    Minutes of Exercise per Session: 60 min  Stress: Stress Concern Present (01/09/2022)   [employer] of Occupational Health - Occupational Stress Questionnaire    Feeling of Stress : Rather much  Social Connections: Socially Integrated (01/09/2022)  Social Advertising Account Executive [NHANES]    Frequency of Communication with Friends and Family: Once a week    Frequency of Social Gatherings with Friends and Family: Twice a week    Attends Religious Services: More than 4 times per year    Active Member of [employer] or Organizations: Yes    Attends Engineer, Structural: Not on file    Marital Status: Married   Allergies  Allergen Reactions   Tramadol     Felt sick with this   Wellbutrin  [Bupropion ]     Muscle twitches   Clindamycin Hcl Rash   Family History  Problem Relation Age of Onset   Diabetes Mother    Arrhythmia Mother    High blood pressure Mother    Mesothelioma Father        asbestos exposure   Allergic rhinitis Father     Sinusitis Father    Healthy Sister    Post-traumatic stress disorder Son    Scoliosis Son    Brain cancer Maternal Aunt    Asthma Neg Hx    Eczema Neg Hx    Urticaria Neg Hx    Angioedema Neg Hx    Immunodeficiency Neg Hx    Atopy Neg Hx    Colon cancer Neg Hx    Esophageal cancer Neg Hx    Stomach cancer Neg Hx    Colon polyps Neg Hx      Current Outpatient Medications (Cardiovascular):    rosuvastatin  (CRESTOR ) 10 MG tablet, TAKE 1 TABLET AT BEDTIME  Current Outpatient Medications (Respiratory):    fluticasone  (FLONASE ) 50 MCG/ACT nasal spray, Place 2 sprays into both nostrils daily.   montelukast  (SINGULAIR ) 10 MG tablet, Take 1 tablet (10 mg total) by mouth at bedtime.  Current Outpatient Medications (Analgesics):    meloxicam  (MOBIC ) 7.5 MG tablet, Take 1 tablet (7.5 mg total) by mouth daily.  Current Outpatient Medications (Hematological):    vitamin B-12 (CYANOCOBALAMIN) 1000 MCG tablet, Take 1,000 mcg by mouth daily.  Current Outpatient Medications (Other):    CALCIUM  CITRATE PO, Take 600 mg by mouth daily.   esomeprazole  (NEXIUM ) 40 MG capsule, TAKE 1 CAPSULE DAILY   gabapentin  (NEURONTIN ) 100 MG capsule, TAKE 1 CAPSULE THREE TIMES A DAY   gabapentin  (NEURONTIN ) 100 MG capsule, TAKE 1 CAPSULE THREE TIMES A DAY   lidocaine -prilocaine  (EMLA ) cream, APPLY 1 APPLICATION TOPICALLY AS NEEDED   methocarbamol  (ROBAXIN ) 500 MG tablet, Take 1 tablet (500 mg total) by mouth every 8 (eight) hours as needed for muscle spasms.   OVER THE COUNTER MEDICATION, Zeasorb powder   zolpidem  (AMBIEN  CR) 6.25 MG CR tablet, Take 1 tablet (6.25 mg total) by mouth at bedtime as needed for sleep.   Reviewed prior external information including notes and imaging from  primary care provider As well as notes that were available from care everywhere and other healthcare systems.  Past medical history, social, surgical and family history all reviewed in electronic medical record.  No pertanent  information unless stated regarding to the chief complaint.   Review of Systems:  No headache, visual changes, nausea, vomiting, diarrhea, constipation, dizziness, abdominal pain, skin rash, fevers, chills, night sweats, weight loss, swollen lymph nodes, body aches, joint swelling, chest pain, shortness of breath, mood changes. POSITIVE muscle aches  Objective  Blood pressure 120/72, pulse 71, height 5' 1 (1.549 m), SpO2 97%.   General: No apparent distress alert and oriented x3 mood and affect normal, dressed appropriately.  HEENT: Pupils equal,  extraocular movements intact  Respiratory: Patient's speak in full sentences and does not appear short of breath  Cardiovascular: No lower extremity edema, non tender, no erythema  Bilateral hip exam shows tender to palpation over the greater trochanteric area.  Tenderness with certain range of motion.  Negative straight leg test.  Does have some mild loss of lordosis of the lumbar spine. Bilateral shoulders do have positive impingement noted with Neer and Hawkins.  Rotator cuff strength does appear to be intact but does have some very mild weakness noted on the side comparatively.  Lacks the last 5 degrees of forward flexion.  After verbal consent patient was prepped with alcohol swab and with a 21-gauge 2 inch needle injected into the right greater trochanteric area with 2 cc of 0.5% Marcaine  and 1 cc of Kenalog  40 mg/mL.  No blood loss.  Band-Aid placed.  Postinjection instructions given   After verbal consent patient was prepped with alcohol swab and with a 21-gauge 2 inch needle injected into the left greater trochanteric area with 2 cc of 0.5% Marcaine  and 1 cc of Kenalog  40 mg/mL.  No blood loss.  Band-Aid placed.  Postinjection instructions given   Impression and Recommendations:     The above documentation has been reviewed and is accurate and complete Tlaloc Taddei M Jatniel Verastegui, DO

## 2023-02-07 ENCOUNTER — Ambulatory Visit: Payer: Medicare Other | Admitting: Podiatry

## 2023-02-07 ENCOUNTER — Encounter: Payer: Self-pay | Admitting: Family Medicine

## 2023-02-07 ENCOUNTER — Ambulatory Visit: Payer: Medicare Other | Admitting: Family Medicine

## 2023-02-07 VITALS — BP 120/72 | HR 71 | Ht 61.0 in

## 2023-02-07 DIAGNOSIS — M7062 Trochanteric bursitis, left hip: Secondary | ICD-10-CM

## 2023-02-07 DIAGNOSIS — M7061 Trochanteric bursitis, right hip: Secondary | ICD-10-CM

## 2023-02-07 DIAGNOSIS — M7551 Bursitis of right shoulder: Secondary | ICD-10-CM

## 2023-02-07 DIAGNOSIS — M7552 Bursitis of left shoulder: Secondary | ICD-10-CM

## 2023-02-07 NOTE — Assessment & Plan Note (Signed)
 Chronic problem with exacerbation.  Discussed icing regimen and home exercises, discussed which activities to do and which ones to avoid.  Increase activity slowly otherwise.  Do believe the patient should continue to make some improvement.  Follow-up with me again 3 months otherwise.  Patient still does not feel that anything else is giving her significant description to me and injections to get her nearly 100% better for months.

## 2023-02-07 NOTE — Assessment & Plan Note (Signed)
 Bursitis with likely underlying arthritic changes.  Patient does have some weakness, does seem to be bilateral.  Discussed with patient that icing regimen and home exercises, which activities to do and which ones to avoid.  Increase activity slowly otherwise.  Follow-up again in 6 to 8 weeks

## 2023-02-07 NOTE — Patient Instructions (Addendum)
 Good to see you  Injections in both hips today  Exercises given for the shoulders try to do 3 times a week  Keep hands within Periferal view  Follow up in 3 months

## 2023-02-21 ENCOUNTER — Encounter: Payer: Self-pay | Admitting: Podiatry

## 2023-02-21 ENCOUNTER — Ambulatory Visit: Payer: Medicare Other | Admitting: Podiatry

## 2023-02-21 DIAGNOSIS — E1142 Type 2 diabetes mellitus with diabetic polyneuropathy: Secondary | ICD-10-CM

## 2023-02-21 DIAGNOSIS — M79674 Pain in right toe(s): Secondary | ICD-10-CM

## 2023-02-21 DIAGNOSIS — B351 Tinea unguium: Secondary | ICD-10-CM

## 2023-02-21 DIAGNOSIS — M79675 Pain in left toe(s): Secondary | ICD-10-CM

## 2023-02-21 NOTE — Progress Notes (Signed)
  Subjective:  Patient ID: Renee Bauer, female    DOB: 01-01-1951,  MRN: [id number]  Chief Complaint  Patient presents with   [facility]    RM#[id number] patient has no concerns today.   73 y.o. female returns for the above complaint.  Patient presents with thickened elongated dystrophic toenails x10.  Pain full to touch.  She is a diabetic with last A1c of 5.7.  She would like to have her nails debrided down.  She denies any other acute complaints.  Objective:  There were no vitals filed for this visit. Podiatric Exam: Vascular: dorsalis pedis and posterior tibial pulses are palpable bilateral. Capillary return is immediate. Temperature gradient is WNL. Skin turgor WNL  Sensorium: Normal Semmes Weinstein monofilament test. Normal tactile sensation bilaterally. Nail Exam: Pt has thick disfigured discolored nails with subungual debris noted bilateral entire nail hallux through fifth toenails.  Except left second digit due to previous total nail avulsion.  Pain on palpation to the nails. Ulcer Exam: There is no evidence of ulcer or pre-ulcerative changes or infection. Orthopedic Exam: Muscle tone and strength are WNL. No limitations in general ROM. No crepitus or effusions noted. HAV  B/L.  Hammer toes 2-5  B/L. Skin: No Porokeratosis. No infection or ulcers    Assessment & Plan:   No diagnosis found.      Patient was evaluated and treated and all questions answered.  Onychomycosis with pain  -Nails palliatively debrided as below. -Educated on self-care  Procedure: Nail Debridement Rationale: pain  Type of Debridement: manual, sharp debridement. Instrumentation: Nail nipper, rotary burr. Number of Nails: 9  Procedures and Treatment: Consent by patient was obtained for treatment procedures. The patient understood the discussion of treatment and procedures well. All questions were answered thoroughly reviewed. Debridement of mycotic and hypertrophic toenails, 1 through 5 bilateral and  clearing of subungual debris. No ulceration, no infection noted.  Return Visit-Office Procedure: Patient instructed to return to the office for a follow up visit 3 months for continued evaluation and treatment.  Nicholes Rough, DPM    No follow-ups on file.

## 2023-03-05 ENCOUNTER — Other Ambulatory Visit: Payer: Self-pay | Admitting: Family Medicine

## 2023-03-05 DIAGNOSIS — K219 Gastro-esophageal reflux disease without esophagitis: Secondary | ICD-10-CM

## 2023-04-26 NOTE — Progress Notes (Signed)
 Office Visit Note  Patient: Renee Bauer             Date of Birth: [birth year]           MRN: [id number]             PCP: Koren Shiver, DO Referring: Karie Georges, MD Visit Date: 05/09/2023 Occupation: @GUAROCC @  Subjective:  Lower back pain  History of Present Illness: Renee Bauer is a 73 y.o. female with osteoarthritis, degenerative disc disease and osteopenia.  She returns today after her last visit in April 2024.  She continues to have pain and discomfort in her both hands, lower back, bilateral trochanteric bursa and occasionally in her knee joints.  She states she has been getting bilateral trochanteric bursa injections with Dr. Antoine Primas which has been helpful.  She has been taking calcium and vitamin D on a regular basis.  She walks 2 miles a day, 20 minutes of bike and 30 minutes of weight training daily.  Patient states she had over 30 pounds of intentional weight loss since the last visit.    Activities of Daily Living:  Patient reports morning stiffness for a few minutes.   Patient Reports nocturnal pain.  Difficulty dressing/grooming: Denies Difficulty climbing stairs: Denies Difficulty getting out of chair: Denies Difficulty using hands for taps, buttons, cutlery, and/or writing: Denies  Review of Systems  Constitutional:  Negative for fatigue.  HENT:  Negative for mouth sores and mouth dryness.   Eyes:  Negative for dryness.  Respiratory:  Negative for shortness of breath.   Cardiovascular:  Negative for chest pain and palpitations.  Gastrointestinal:  Positive for constipation. Negative for blood in stool and diarrhea.  Endocrine: Negative for increased urination.  Genitourinary:  Positive for nocturia. Negative for painful urination and involuntary urination.  Musculoskeletal:  Positive for joint pain, joint pain, myalgias, muscle weakness, morning stiffness, muscle tenderness and myalgias. Negative for gait problem and joint swelling.   Skin:  Positive for hair loss. Negative for color change, rash and sensitivity to sunlight.  Allergic/Immunologic: Negative for susceptible to infections.  Neurological:  Negative for dizziness and headaches.  Hematological:  Negative for swollen glands.  Psychiatric/Behavioral:  Positive for depressed mood and sleep disturbance. The patient is nervous/anxious.     PMFS History:  Patient Active Problem List   Diagnosis Date Noted   Bilateral shoulder bursitis 02/07/2023   GERD without esophagitis 09/11/2021   Chronic knee pain 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 of left knee joint 07/06/2021   Greater trochanteric bursitis of both hips 05/23/2021   Primary osteoarthritis of both hands 07/06/2019   Primary osteoarthritis of both knees 07/06/2019   Unilateral primary osteoarthritis, left knee 10/31/2018   Arthritis 10/30/2018   DM (diabetes mellitus) type II controlled, neurological manifestation (HCC) 10/30/2018   Neuropathy of both feet 10/30/2018   Anxiety 10/30/2018   Insomnia 10/30/2018   History of skin cancer 10/30/2018   Osteopenia 09/30/2018    Past Medical History:  Diagnosis Date   Anxiety    Arthritis    Benign colon polyp 2018   Bursitis    Depression    Diabetes mellitus without complication (HCC)    GERD (gastroesophageal reflux disease)    Hyperlipidemia    Rosacea     Family History  Problem Relation Age of Onset   Diabetes Mother    Arrhythmia Mother    High blood pressure Mother    Mesothelioma Father  asbestos exposure   Allergic rhinitis Father    Sinusitis Father    Healthy Sister    Post-traumatic stress disorder Son    Scoliosis Son    Brain cancer Maternal Aunt    Asthma Neg Hx    Eczema Neg Hx    Urticaria Neg Hx    Angioedema Neg Hx    Immunodeficiency Neg Hx    Atopy Neg Hx    Colon cancer Neg Hx    Esophageal cancer Neg Hx    Stomach cancer Neg Hx    Colon polyps Neg Hx    Past Surgical History:  Procedure Laterality Date    ABDOMINAL HYSTERECTOMY     menorrhagia   BLADDER REPAIR     BREAST BIOPSY Left    BUNIONECTOMY Right    COLON RESECTION  2007   COLONOSCOPY     DILATION AND CURETTAGE OF UTERUS     TOTAL KNEE ARTHROPLASTY Left 09/29/2020   Social History   Social History Narrative   Not on file   Immunization History  Administered Date(s) Administered   Fluad Quad(high Dose 65+) 11/04/2018   Influenza-Unspecified 09/30/2021   Moderna Sars-Covid-2 Vaccination 06/10/2019, 07/06/2019   Pneumococcal Conjugate-13 11/04/2018   Pneumococcal Polysaccharide-23 06/10/2020   Td 07/19/2016   Zoster Recombinant(Shingrix) 06/10/2020, 11/22/2020     Objective: Vital Signs: BP 126/84 (BP Location: Left Arm, Patient Position: Sitting, Cuff Size: Normal)   Pulse 70   Resp 14   Ht 5' 1.5" (1.562 m)   Wt 127 lb 12.8 oz (58 kg)   BMI 23.76 kg/m    Physical Exam Vitals and nursing note reviewed.  Constitutional:      Appearance: She is well-developed.  HENT:     Head: Normocephalic and atraumatic.  Eyes:     Conjunctiva/sclera: Conjunctivae normal.  Cardiovascular:     Rate and Rhythm: Normal rate and regular rhythm.     Heart sounds: Normal heart sounds.  Pulmonary:     Effort: Pulmonary effort is normal.     Breath sounds: Normal breath sounds.  Abdominal:     General: Bowel sounds are normal.     Palpations: Abdomen is soft.  Musculoskeletal:     Cervical back: Normal range of motion.  Lymphadenopathy:     Cervical: No cervical adenopathy.  Skin:    General: Skin is warm and dry.     Capillary Refill: Capillary refill takes less than 2 seconds.  Neurological:     Mental Status: She is alert and oriented to person, place, and time.  Psychiatric:        Behavior: Behavior normal.      Musculoskeletal Exam: Cervical point was in good range of motion.  She had discomfort range of motion of the lumbar spine.  She had tenderness over left SI joint.  Shoulders, elbows, wrists, MCPs, PIPs and  DIPs in good range of motion with no synovitis.  Hip joints in good range of motion.  She had mild tenderness over bilateral trochanteric bursa. Knee joints were in good range of motion.  There was no tenderness over ankles or MTPs.  CDAI Exam: CDAI Score: -- Patient Global: --; Provider Global: -- Swollen: --; Tender: -- Joint Exam 05/09/2023   No joint exam has been documented for this visit   There is currently no information documented on the homunculus. Go to the Rheumatology activity and complete the homunculus joint exam.  Investigation: No additional findings.  Imaging: No results found.  Recent Labs:  Lab Results  Component Value Date   WBC 7.0 05/18/2021   HGB 14.2 05/18/2021   PLT 216.0 05/18/2021   NA 140 05/18/2021   K 3.6 05/18/2021   CL 103 05/18/2021   CO2 28 05/18/2021   GLUCOSE 89 05/18/2021   BUN 15 05/18/2021   CREATININE 0.86 05/18/2021   BILITOT 0.5 05/18/2021   ALKPHOS 78 05/18/2021   AST 18 05/18/2021   ALT 12 05/18/2021   PROT 7.2 05/18/2021   ALBUMIN 4.4 05/18/2021   CALCIUM 10.2 07/06/2021    Speciality Comments: No specialty comments available.  Procedures:  Sacroiliac Joint Inj on 05/09/2023 8:11 AM Indications: pain Details: 27 G 1.5 in needle, posterior approach Medications: 1 mL lidocaine 1 %; 40 mg triamcinolone acetonide 40 MG/ML Aspirate: 0 mL Outcome: tolerated well, no immediate complications Procedure, treatment alternatives, risks and benefits explained, specific risks discussed. Consent was given by the patient. Immediately prior to procedure a time out was called to verify the correct patient, procedure, equipment, support staff and site/side marked as required. Patient was prepped and draped in the usual sterile fashion.     Allergies: Tramadol, Wellbutrin [bupropion], and Clindamycin hcl   Assessment / Plan:     Visit Diagnoses: Primary osteoarthritis of both hands -she continues to have stiffness in her hands.  No  synovitis was noted.  She has had [facility] injections in the past.  Trochanteric bursitis of both hips-she continues to have ongoing pain and discomfort in her trochanteric bursa.  She states she has been getting trochanteric bursa injections with Dr. Katrinka Blazing.  Chronic left SI joint pain -she had good response to left SI joint injection about a year ago.  She states the pain has recurred.  She has been having difficulty walking.  She requested SI joint injection.  Side effects including the increased risk of infection, tendon and nerve injury, dermal atrophy, discoloration of the skin were discussed.  After informed consent was obtained left SI joint was injected with lidocaine and Kenalog as described above.  Patient tolerated the procedure well.  Postprocedure instructions given.  Plan: Sacroiliac Joint Inj  Status post total knee replacement, left - September 29, 2020 by Dr. Valentina Gu.  Doing well.  Primary osteoarthritis of right knee - Right knee moderate osteoarthritis,  moderate chondromalacia patella.  She has intermittent discomfort.  No warmth swelling or effusion was noted.  She takes meloxicam as needed.  Primary osteoarthritis of both feet-proper fitting shoes were advised.  Neuropathy of both feet-she is on gabapentin.  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  She has chronic discomfort.  A handout on back exercises was given.  Core strengthening exercises were discussed.  Osteopenia of multiple sites - Treated with Reclast in the past.  April 21, 2021DXA T score -1.3. 11/08/21 DEXA T -1.4, BMD 0.846 right femoral neck.  Patient will need repeat DEXA scan.  Patient will schedule repeat DEXA scan through Dr. Michaelle [facility]as office [facility] the [facility].  She has been taking calcium and vitamin D.  She has been walking about 2 miles daily, she rides bike and also does weights almost daily.  Other medical problems are listed as follows:  Controlled type 2  diabetes mellitus with diabetic polyneuropathy, without long-term current use of insulin (HCC)-she was advised to monitor blood glucose level closely after the cortisone injection.  History of hyperlipidemia-she is on Crestor 10 mg p.o. daily.  History of gastroesophageal reflux (GERD)-she takes Nexium and Prilosec.  Anxiety and depression  History of skin cancer  Hx of colonic polyps  Other insomnia-on Ambien 6.25 mg CR p.o. nightly as needed  Orders: Orders Placed This Encounter  Procedures   Sacroiliac Joint Inj   No orders of the defined types were placed in this encounter.    Follow-Up Instructions: Return in about 1 year (around 05/08/2024) for Osteoarthritis.   Pollyann Savoy, MD  Note - This record has been created using Animal nutritionist.  Chart creation errors have been sought, but may not always  have been located. Such creation errors do not reflect on  the standard of medical care.

## 2023-05-01 NOTE — Progress Notes (Unsigned)
 Renee Bauer [address] [place] [address] Phone: [phone] Subjective:   Renee Bauer, am serving as a scribe for Renee Bauer.  I'm seeing this patient by the request  of:  Renee Shiver, DO  CC:   [id number]  02/07/2023 Bursitis with likely underlying arthritic changes.  Patient does have some weakness, does seem to be bilateral.  Discussed with patient that icing regimen and home exercises, which activities to do and which ones to avoid.  Increase activity slowly otherwise.  Follow-up again in 6 to 8 weeks     Chronic problem with exacerbation.  Discussed icing regimen and home exercises, discussed which activities to do and which ones to avoid.  Increase activity slowly otherwise.  Do believe the patient should continue to make some improvement.  Follow-up with me again 3 months otherwise.  Patient still does not feel that anything else is giving her significant description to me and injections to get her nearly 100% better for months.      Update 05/02/2023 Renee Bauer is a 73 y.o. female coming in with complaint of B hip pain. Patient states does well with injections for about 2 months , but now pain is coming back. No other problems to report.  Patient states that the pain is waking her up at night.  Both hips.  Only on the lateral aspect.  Mild radiation towards the knee but never passed it.  Mild back pain but does not seem to be as related.      Past Medical History:  Diagnosis Date   Anxiety    Arthritis    Benign colon polyp 2018   Bursitis    Depression    Diabetes mellitus without complication (HCC)    GERD (gastroesophageal reflux disease)    Hyperlipidemia    Rosacea    Past Surgical History:  Procedure Laterality Date   ABDOMINAL HYSTERECTOMY     menorrhagia   BLADDER REPAIR     BREAST BIOPSY Left    BUNIONECTOMY Right    COLON RESECTION  2007   COLONOSCOPY     DILATION AND CURETTAGE OF UTERUS      TOTAL KNEE ARTHROPLASTY Left 09/29/2020   Social History   Socioeconomic History   Marital status: Married    Spouse name: Not on file   Number of children: 2   Years of education: Not on file   Highest education level: Associate degree: occupational, Scientist, product/process development, or vocational program  Occupational History   Occupation: Retired  Tobacco Use   Smoking status: Never   Smokeless tobacco: Never  Vaping Use   Vaping status: Never Used  Substance and Sexual Activity   Alcohol use: Yes    Comment: occasional wine   Drug use: Never   Sexual activity: Not Currently  Other Topics Concern   Not on file  Social History Narrative   Not on file   Social Drivers of Health   Financial Resource Strain: Low Risk  (01/09/2022)   Overall Financial Resource Strain (CARDIA)    Difficulty of Paying Living Expenses: Not hard at all  Food Insecurity: No Food Insecurity (01/09/2022)   Hunger Vital Sign    Worried About Running Out of Food in the Last Year: Never true    Ran Out of Food in the Last Year: Never true  Transportation Needs: No Transportation Needs (01/09/2022)   PRAPARE - Administrator, Civil Service (Medical): No  Lack of Transportation (Non-Medical): No  Physical Activity: Sufficiently Active (01/09/2022)   Exercise Vital Sign    Days of Exercise per Week: 4 days    Minutes of Exercise per Session: 60 min  Stress: Stress Concern Present (01/09/2022)   [employer] of Occupational Health - Occupational Stress Questionnaire    Feeling of Stress : Rather much  Social Connections: Socially Integrated (01/09/2022)   Social Connection and Isolation Panel [NHANES]    Frequency of Communication with Friends and Family: Once a week    Frequency of Social Gatherings with Friends and Family: Twice a week    Attends Religious Services: More than 4 times per year    Active Member of [employer] or Organizations: Yes    Attends Engineer, structural: Not on file     Marital Status: Married   Allergies  Allergen Reactions   Tramadol     Felt sick with this   Wellbutrin [Bupropion]     Muscle twitches   Clindamycin Hcl Rash   Family History  Problem Relation Age of Onset   Diabetes Mother    Arrhythmia Mother    High blood pressure Mother    Mesothelioma Father        asbestos exposure   Allergic rhinitis Father    Sinusitis Father    Healthy Sister    Post-traumatic stress disorder Son    Scoliosis Son    Brain cancer Maternal Aunt    Asthma Neg Hx    Eczema Neg Hx    Urticaria Neg Hx    Angioedema Neg Hx    Immunodeficiency Neg Hx    Atopy Neg Hx    Colon cancer Neg Hx    Esophageal cancer Neg Hx    Stomach cancer Neg Hx    Colon polyps Neg Hx     Current Outpatient Medications (Endocrine & Metabolic):    predniSONE (DELTASONE) 20 MG tablet, Take 1 tablet (20 mg total) by mouth daily with breakfast.  Current Outpatient Medications (Cardiovascular):    rosuvastatin (CRESTOR) 10 MG tablet, TAKE 1 TABLET AT BEDTIME  Current Outpatient Medications (Respiratory):    fluticasone (FLONASE) 50 MCG/ACT nasal spray, Place 2 sprays into both nostrils daily.   montelukast (SINGULAIR) 10 MG tablet, Take 1 tablet (10 mg total) by mouth at bedtime.  Current Outpatient Medications (Analgesics):    meloxicam (MOBIC) 7.5 MG tablet, Take 1 tablet (7.5 mg total) by mouth daily.  Current Outpatient Medications (Hematological):    vitamin B-12 (CYANOCOBALAMIN) 1000 MCG tablet, Take 1,000 mcg by mouth daily.  Current Outpatient Medications (Other):    CALCIUM CITRATE PO, Take 600 mg by mouth daily.   esomeprazole (NEXIUM) 40 MG capsule, TAKE 1 CAPSULE DAILY   gabapentin (NEURONTIN) 100 MG capsule, TAKE 1 CAPSULE THREE TIMES A DAY   gabapentin (NEURONTIN) 100 MG capsule, TAKE 1 CAPSULE THREE TIMES A DAY   lidocaine-prilocaine (EMLA) cream, APPLY 1 APPLICATION TOPICALLY AS NEEDED   methocarbamol (ROBAXIN) 500 MG tablet, Take 1 tablet (500 mg  total) by mouth every 8 (eight) hours as needed for muscle spasms.   OVER THE COUNTER MEDICATION, Zeasorb powder   zolpidem (AMBIEN CR) 6.25 MG CR tablet, Take 1 tablet (6.25 mg total) by mouth at bedtime as needed for sleep.   Reviewed prior external information including notes and imaging from  primary care provider As well as notes that were available from care everywhere and other healthcare systems.  Past medical history, social, surgical and family  history all reviewed in electronic medical record.  No pertanent information unless stated regarding to the chief complaint.   Review of Systems:  No headache, visual changes, nausea, vomiting, diarrhea, constipation, dizziness, abdominal pain, skin rash, fevers, chills, night sweats, weight loss, swollen lymph nodes, body aches, joint swelling, chest pain, shortness of breath, mood changes. POSITIVE muscle aches  Objective  Blood pressure 102/66, pulse 77, height 5\' 1"  (1.549 m), weight 128 lb (58.1 kg), SpO2 96%.   General: No apparent distress alert and oriented x3 mood and affect normal, dressed appropriately.  HEENT: Pupils equal, extraocular movements intact  Respiratory: Patient's speak in full sentences and does not appear short of breath  Cardiovascular: No lower extremity edema, non tender, no erythema  Bilateral hips do have tenderness to palpation over the greater trochanteric area bilaterally.  Seems to be worse right greater than left.  Positive FABER test.  Negative straight leg test.   After verbal consent patient was prepped with alcohol swab and with a 21-gauge 2 inch needle injected into the right greater trochanteric area with 2 cc of 0.5% Marcaine and 1 cc of Kenalog 40 mg/mL.  No blood loss.  Band-Aid placed.  Postinjection instructions given   After verbal consent patient was prepped with alcohol swab and with a 21-gauge 2 inch needle injected into the left greater trochanteric area with 2 cc of 0.5% Marcaine and 1  cc of Kenalog 40 mg/mL.  No blood loss.  Band-Aid placed.  Postinjection instructions given    Impression and Recommendations:     The above documentation has been reviewed and is accurate and complete Judi Saa, DO

## 2023-05-02 ENCOUNTER — Ambulatory Visit: Payer: Medicare Other | Admitting: Family Medicine

## 2023-05-02 ENCOUNTER — Encounter: Payer: Self-pay | Admitting: Family Medicine

## 2023-05-02 VITALS — BP 102/66 | HR 77 | Ht 61.0 in | Wt 128.0 lb

## 2023-05-02 DIAGNOSIS — M7062 Trochanteric bursitis, left hip: Secondary | ICD-10-CM

## 2023-05-02 DIAGNOSIS — M7061 Trochanteric bursitis, right hip: Secondary | ICD-10-CM

## 2023-05-02 MED ORDER — PREDNISONE 20 MG PO TABS: 20.0000 mg | ORAL_TABLET | Freq: Every day | ORAL | 0 refills | Status: DC

## 2023-05-02 NOTE — Assessment & Plan Note (Addendum)
 Repeat injection given today, tolerated the procedure well, discussed icing regimen and home exercises, discussed with patient that there is still a chance that there is some radicular symptoms that could be potentially contributing.  Patient would like to hold on any further workup because she does not think it would change medical management.  Feels like as long as she gets injections every 10 to 12 weeks does very well.Patient will be traveling and given a short course also of prednisone 20 mg daily for 5 days

## 2023-05-02 NOTE — Patient Instructions (Addendum)
 Injections in hips today Good to see you! Prednisone for [place] trip See you again in 10-12 weeks

## 2023-05-04 ENCOUNTER — Ambulatory Visit: Payer: Medicare Other | Admitting: Podiatry

## 2023-05-04 ENCOUNTER — Encounter: Payer: Self-pay | Admitting: Podiatry

## 2023-05-04 DIAGNOSIS — M79675 Pain in left toe(s): Secondary | ICD-10-CM

## 2023-05-04 DIAGNOSIS — E1142 Type 2 diabetes mellitus with diabetic polyneuropathy: Secondary | ICD-10-CM

## 2023-05-04 DIAGNOSIS — B351 Tinea unguium: Secondary | ICD-10-CM

## 2023-05-04 DIAGNOSIS — M79674 Pain in right toe(s): Secondary | ICD-10-CM | POA: Diagnosis not present

## 2023-05-04 NOTE — Progress Notes (Signed)
  Subjective:  Patient ID: Renee Bauer, female    DOB: 12/27/1950,  MRN: [id number]  Chief Complaint  Patient presents with   Debridement    Trim toenails - diabetic - 5.7, still some tingling in her feet with taking gabapentin   73 y.o. female returns for the above complaint.  Patient presents with thickened elongated dystrophic toenails x10.  Pain full to touch.  She is a diabetic with last A1c of 5.7.  She would like to have her nails debrided down.  She denies any other acute complaints.  Objective:  There were no vitals filed for this visit. Podiatric Exam: Vascular: dorsalis pedis and posterior tibial pulses are palpable bilateral. Capillary return is immediate. Temperature gradient is WNL. Skin turgor WNL  Sensorium: Normal Semmes Weinstein monofilament test. Normal tactile sensation bilaterally. Nail Exam: Pt has thick disfigured discolored nails with subungual debris noted bilateral entire nail hallux through fifth toenails.  Except left second digit due to previous total nail avulsion.  Pain on palpation to the nails. Ulcer Exam: There is no evidence of ulcer or pre-ulcerative changes or infection. Orthopedic Exam: Muscle tone and strength are WNL. No limitations in general ROM. No crepitus or effusions noted. HAV  B/L.  Hammer toes 2-5  B/L. Skin: No Porokeratosis. No infection or ulcers    Assessment & Plan:   No diagnosis found.      Patient was evaluated and treated and all questions answered.  Onychomycosis with pain  -Nails palliatively debrided as below. -Educated on self-care  Procedure: Nail Debridement Rationale: pain  Type of Debridement: manual, sharp debridement. Instrumentation: Nail nipper, rotary burr. Number of Nails: 9  Procedures and Treatment: Consent by patient was obtained for treatment procedures. The patient understood the discussion of treatment and procedures well. All questions were answered thoroughly reviewed. Debridement of mycotic  and hypertrophic toenails, 1 through 5 bilateral and clearing of subungual debris. No ulceration, no infection noted.  Return Visit-Office Procedure: Patient instructed to return to the office for a follow up visit 3 months for continued evaluation and treatment.  Nicholes Rough, DPM    No follow-ups on file.

## 2023-05-09 ENCOUNTER — Encounter: Payer: Self-pay | Admitting: Rheumatology

## 2023-05-09 ENCOUNTER — Ambulatory Visit: Payer: Medicare Other | Attending: Rheumatology | Admitting: Rheumatology

## 2023-05-09 VITALS — BP 126/84 | HR 70 | Resp 14 | Ht 61.5 in | Wt 127.8 lb

## 2023-05-09 DIAGNOSIS — M19041 Primary osteoarthritis, right hand: Secondary | ICD-10-CM | POA: Diagnosis not present

## 2023-05-09 DIAGNOSIS — M1711 Unilateral primary osteoarthritis, right knee: Secondary | ICD-10-CM

## 2023-05-09 DIAGNOSIS — Z8719 Personal history of other diseases of the digestive system: Secondary | ICD-10-CM

## 2023-05-09 DIAGNOSIS — M7061 Trochanteric bursitis, right hip: Secondary | ICD-10-CM

## 2023-05-09 DIAGNOSIS — M8589 Other specified disorders of bone density and structure, multiple sites: Secondary | ICD-10-CM

## 2023-05-09 DIAGNOSIS — G5793 Unspecified mononeuropathy of bilateral lower limbs: Secondary | ICD-10-CM

## 2023-05-09 DIAGNOSIS — M533 Sacrococcygeal disorders, not elsewhere classified: Secondary | ICD-10-CM | POA: Diagnosis not present

## 2023-05-09 DIAGNOSIS — Z96652 Presence of left artificial knee joint: Secondary | ICD-10-CM

## 2023-05-09 DIAGNOSIS — M19042 Primary osteoarthritis, left hand: Secondary | ICD-10-CM | POA: Diagnosis not present

## 2023-05-09 DIAGNOSIS — F419 Anxiety disorder, unspecified: Secondary | ICD-10-CM

## 2023-05-09 DIAGNOSIS — M19072 Primary osteoarthritis, left ankle and foot: Secondary | ICD-10-CM

## 2023-05-09 DIAGNOSIS — G8929 Other chronic pain: Secondary | ICD-10-CM

## 2023-05-09 DIAGNOSIS — M7062 Trochanteric bursitis, left hip: Secondary | ICD-10-CM

## 2023-05-09 DIAGNOSIS — G4709 Other insomnia: Secondary | ICD-10-CM

## 2023-05-09 DIAGNOSIS — E1142 Type 2 diabetes mellitus with diabetic polyneuropathy: Secondary | ICD-10-CM

## 2023-05-09 DIAGNOSIS — Z860100 Personal history of colon polyps, unspecified: Secondary | ICD-10-CM

## 2023-05-09 DIAGNOSIS — Z85828 Personal history of other malignant neoplasm of skin: Secondary | ICD-10-CM

## 2023-05-09 DIAGNOSIS — Z8639 Personal history of other endocrine, nutritional and metabolic disease: Secondary | ICD-10-CM

## 2023-05-09 DIAGNOSIS — M19071 Primary osteoarthritis, right ankle and foot: Secondary | ICD-10-CM

## 2023-05-09 DIAGNOSIS — F32A Depression, unspecified: Secondary | ICD-10-CM

## 2023-05-09 DIAGNOSIS — M47816 Spondylosis without myelopathy or radiculopathy, lumbar region: Secondary | ICD-10-CM

## 2023-05-09 MED ORDER — TRIAMCINOLONE ACETONIDE 40 MG/ML IJ SUSP
40.0000 mg | INTRAMUSCULAR | Status: AC | PRN
  Administered 2023-05-09: 40 mg via INTRA_ARTICULAR

## 2023-05-09 MED ORDER — LIDOCAINE HCL 1 % IJ SOLN
1.0000 mL | INTRAMUSCULAR | Status: AC | PRN
  Administered 2023-05-09: 1 mL

## 2023-05-09 NOTE — Patient Instructions (Signed)
 Low Back Sprain or Strain Rehab Ask your health care provider which exercises are safe for you. Do exercises exactly as told by your health care provider and adjust them as directed. It is normal to feel mild stretching, pulling, tightness, or discomfort as you do these exercises. Stop right away if you feel sudden pain or your pain gets worse. Do not begin these exercises until told by your health care provider. Stretching and range-of-motion exercises These exercises warm up your muscles and joints and improve the movement and flexibility of your back. These exercises also help to relieve pain, numbness, and tingling. Lumbar rotation  Lie on your back on a firm bed or the floor with your knees bent. Straighten your arms out to your sides so each arm forms a 90-degree angle (right angle) with a side of your body. Slowly move (rotate) both of your knees to one side of your body until you feel a stretch in your lower back (lumbar). Try not to let your shoulders lift off the floor. Hold this position for __________ seconds. Tense your abdominal muscles and slowly move your knees back to the starting position. Repeat this exercise on the other side of your body. Repeat __________ times. Complete this exercise __________ times a day. Single knee to chest  Lie on your back on a firm bed or the floor with both legs straight. Bend one of your knees. Use your hands to move your knee up toward your chest until you feel a gentle stretch in your lower back and buttock. Hold your leg in this position by holding on to the front of your knee. Keep your other leg as straight as possible. Hold this position for __________ seconds. Slowly return to the starting position. Repeat with your other leg. Repeat __________ times. Complete this exercise __________ times a day. Prone extension on elbows  Lie on your abdomen on a firm bed or the floor (prone position). Prop yourself up on your elbows. Use your arms  to help lift your chest up until you feel a gentle stretch in your abdomen and your lower back. This will place some of your body weight on your elbows. If this is uncomfortable, try stacking pillows under your chest. Your hips should stay down, against the surface that you are lying on. Keep your hip and back muscles relaxed. Hold this position for __________ seconds. Slowly relax your upper body and return to the starting position. Repeat __________ times. Complete this exercise __________ times a day. Strengthening exercises These exercises build strength and endurance in your back. Endurance is the ability to use your muscles for a long time, even after they get tired. Pelvic tilt This exercise strengthens the muscles that lie deep in the abdomen. Lie on your back on a firm bed or the floor with your legs extended. Bend your knees so they are pointing toward the ceiling and your feet are flat on the floor. Tighten your lower abdominal muscles to press your lower back against the floor. This motion will tilt your pelvis so your tailbone points up toward the ceiling instead of pointing to your feet or the floor. To help with this exercise, you may place a small towel under your lower back and try to push your back into the towel. Hold this position for __________ seconds. Let your muscles relax completely before you repeat this exercise. Repeat __________ times. Complete this exercise __________ times a day. Alternating arm and leg raises  Get on your hands  and knees on a firm surface. If you are on a hard floor, you may want to use padding, such as an exercise mat, to cushion your knees. Line up your arms and legs. Your hands should be directly below your shoulders, and your knees should be directly below your hips. Lift your left leg behind you. At the same time, raise your right arm and straighten it in front of you. Do not lift your leg higher than your hip. Do not lift your arm higher  than your shoulder. Keep your abdominal and back muscles tight. Keep your hips facing the ground. Do not arch your back. Keep your balance carefully, and do not hold your breath. Hold this position for __________ seconds. Slowly return to the starting position. Repeat with your right leg and your left arm. Repeat __________ times. Complete this exercise __________ times a day. Abdominal set with straight leg raise  Lie on your back on a firm bed or the floor. Bend one of your knees and keep your other leg straight. Tense your abdominal muscles and lift your straight leg up, 4-6 inches (10-15 cm) off the ground. Keep your abdominal muscles tight and hold this position for __________ seconds. Do not hold your breath. Do not arch your back. Keep it flat against the ground. Keep your abdominal muscles tense as you slowly lower your leg back to the starting position. Repeat with your other leg. Repeat __________ times. Complete this exercise __________ times a day. Single leg lower with bent knees Lie on your back on a firm bed or the floor. Tense your abdominal muscles and lift your feet off the floor, one foot at a time, so your knees and hips are bent in 90-degree angles (right angles). Your knees should be over your hips and your lower legs should be parallel to the floor. Keeping your abdominal muscles tense and your knee bent, slowly lower one of your legs so your toe touches the ground. Lift your leg back up to return to the starting position. Do not hold your breath. Do not let your back arch. Keep your back flat against the ground. Repeat with your other leg. Repeat __________ times. Complete this exercise __________ times a day. Posture and body mechanics Good posture and healthy body mechanics can help to relieve stress in your body's tissues and joints. Body mechanics refers to the movements and positions of your body while you do your daily activities. Posture is part of body  mechanics. Good posture means: Your spine is in its natural S-curve position (neutral). Your shoulders are pulled back slightly. Your head is not tipped forward (neutral). Follow these guidelines to improve your posture and body mechanics in your everyday activities. Standing  When standing, keep your spine neutral and your feet about hip-width apart. Keep a slight bend in your knees. Your ears, shoulders, and hips should line up. When you do a task in which you stand in one place for a long time, place one foot up on a stable object that is 2-4 inches (5-10 cm) high, such as a footstool. This helps keep your spine neutral. Sitting  When sitting, keep your spine neutral and keep your feet flat on the floor. Use a footrest, if necessary, and keep your thighs parallel to the floor. Avoid rounding your shoulders, and avoid tilting your head forward. When working at a desk or a computer, keep your desk at a height where your hands are slightly lower than your elbows. Slide your  chair under your desk so you are close enough to maintain good posture. When working at a computer, place your monitor at a height where you are looking straight ahead and you do not have to tilt your head forward or downward to look at the screen. Resting When lying down and resting, avoid positions that are most painful for you. If you have pain with activities such as sitting, bending, stooping, or squatting, lie in a position in which your body does not bend very much. For example, avoid curling up on your side with your arms and knees near your chest (fetal position). If you have pain with activities such as standing for a long time or reaching with your arms, lie with your spine in a neutral position and bend your knees slightly. Try the following positions: Lying on your side with a pillow between your knees. Lying on your back with a pillow under your knees. Lifting  When lifting objects, keep your feet at least  shoulder-width apart and tighten your abdominal muscles. Bend your knees and hips and keep your spine neutral. It is important to lift using the strength of your legs, not your back. Do not lock your knees straight out. Always ask for help to lift heavy or awkward objects. This information is not intended to replace advice given to you by your health care provider. Make sure you discuss any questions you have with your health care provider. Document Revised: 05/22/2022 Document Reviewed: 04/05/2020 Elsevier Patient Education  2024 [employer].

## 2023-05-21 ENCOUNTER — Other Ambulatory Visit: Payer: Self-pay | Admitting: Family Medicine

## 2023-05-21 DIAGNOSIS — M2550 Pain in unspecified joint: Secondary | ICD-10-CM

## 2023-05-21 NOTE — Telephone Encounter (Signed)
>  1 year since last visit, please refuse

## 2023-05-23 ENCOUNTER — Other Ambulatory Visit: Payer: Self-pay | Admitting: Internal Medicine

## 2023-05-23 DIAGNOSIS — M8580 Other specified disorders of bone density and structure, unspecified site: Secondary | ICD-10-CM

## 2023-07-13 ENCOUNTER — Ambulatory Visit (INDEPENDENT_AMBULATORY_CARE_PROVIDER_SITE_OTHER): Admitting: Podiatry

## 2023-07-13 DIAGNOSIS — M79674 Pain in right toe(s): Secondary | ICD-10-CM | POA: Diagnosis not present

## 2023-07-13 DIAGNOSIS — M79675 Pain in left toe(s): Secondary | ICD-10-CM | POA: Diagnosis not present

## 2023-07-13 DIAGNOSIS — B351 Tinea unguium: Secondary | ICD-10-CM | POA: Diagnosis not present

## 2023-07-13 DIAGNOSIS — E1142 Type 2 diabetes mellitus with diabetic polyneuropathy: Secondary | ICD-10-CM | POA: Diagnosis not present

## 2023-07-13 NOTE — Progress Notes (Signed)
  Subjective:  Patient ID: Renee Bauer, female    DOB: 12/03/1950,  MRN: [id number]  Chief Complaint  Patient presents with   Nail Problem    Pt stated that things are the same    73 y.o. female returns for the above complaint.  Patient presents with thickened elongated dystrophic toenails x10.  Pain full to touch.  She is a diabetic with last A1c of 5.7.  She would like to have her nails debrided down.  She denies any other acute complaints.  Objective:  There were no vitals filed for this visit. Podiatric Exam: Vascular: dorsalis pedis and posterior tibial pulses are palpable bilateral. Capillary return is immediate. Temperature gradient is WNL. Skin turgor WNL  Sensorium: Normal Semmes Weinstein monofilament test. Normal tactile sensation bilaterally. Nail Exam: Pt has thick disfigured discolored nails with subungual debris noted bilateral entire nail hallux through fifth toenails.  Except left second digit due to previous total nail avulsion.  Pain on palpation to the nails. Ulcer Exam: There is no evidence of ulcer or pre-ulcerative changes or infection. Orthopedic Exam: Muscle tone and strength are WNL. No limitations in general ROM. No crepitus or effusions noted. HAV  B/L.  Hammer toes 2-5  B/L. Skin: No Porokeratosis. No infection or ulcers    Assessment & Plan:   1. Pain due to onychomycosis of toenails of both feet   2. Controlled type 2 diabetes mellitus with diabetic polyneuropathy, without long-term current use of insulin (HCC)         Patient was evaluated and treated and all questions answered.  Onychomycosis with pain  -Nails palliatively debrided as below. -Educated on self-care  Procedure: Nail Debridement Rationale: pain  Type of Debridement: manual, sharp debridement. Instrumentation: Nail nipper, rotary burr. Number of Nails: 9  Procedures and Treatment: Consent by patient was obtained for treatment procedures. The patient understood the discussion  of treatment and procedures well. All questions were answered thoroughly reviewed. Debridement of mycotic and hypertrophic toenails, 1 through 5 bilateral and clearing of subungual debris. No ulceration, no infection noted.  Return Visit-Office Procedure: Patient instructed to return to the office for a follow up visit 3 months for continued evaluation and treatment.  Tinnie Forehand, DPM    No follow-ups on file.

## 2023-07-13 NOTE — Progress Notes (Signed)
 [facility] [address] [place] [address] Phone: [phone] Subjective:   IBryan Caprio, am serving as a scribe for Dr. Ronnell Coins.  I'm seeing this patient by the request  of:  Jinger Mount, DO (Inactive)  CC: Bilateral hip pain  ONG:[id number]  05/02/2023 Repeat injection given today, tolerated the procedure well, discussed icing regimen and home exercises, discussed with patient that there is still a chance that there is some radicular symptoms that could be potentially contributing.  Patient would like to hold on any further workup because she does not think it would change medical management.  Feels like as long as she gets injections every 10 to 12 weeks does very well.Patient will be traveling and given a short course also of prednisone  20 mg daily for 5 days      Update 07/17/2023 SUEKO DIMICHELE is a 73 y.o. female coming in with complaint of B GT pain. Patient states doing well. Injections from April worked for a couple of months. Now wearing off. No new symptoms.       Past Medical History:  Diagnosis Date   Anxiety    Arthritis    Benign colon polyp 2018   Bursitis    Depression    Diabetes mellitus without complication (HCC)    GERD (gastroesophageal reflux disease)    Hyperlipidemia    Rosacea    Past Surgical History:  Procedure Laterality Date   ABDOMINAL HYSTERECTOMY     menorrhagia   BLADDER REPAIR     BREAST BIOPSY Left    BUNIONECTOMY Right    COLON RESECTION  2007   COLONOSCOPY     DILATION AND CURETTAGE OF UTERUS     TOTAL KNEE ARTHROPLASTY Left 09/29/2020   Social History   Socioeconomic History   Marital status: Married    Spouse name: Not on file   Number of children: 2   Years of education: Not on file   Highest education level: Associate degree: occupational, Scientist, product/process development, or vocational program  Occupational History   Occupation: Retired  Tobacco Use   Smoking status: Never    Passive  exposure: Never   Smokeless tobacco: Never  Vaping Use   Vaping status: Never Used  Substance and Sexual Activity   Alcohol use: Yes    Comment: occasional wine   Drug use: Never   Sexual activity: Not Currently  Other Topics Concern   Not on file  Social History Narrative   Not on file   Social Drivers of Health   Financial Resource Strain: Low Risk  (01/09/2022)   Overall Financial Resource Strain (CARDIA)    Difficulty of Paying Living Expenses: Not hard at all  Food Insecurity: No Food Insecurity (01/09/2022)   Hunger Vital Sign    Worried About Running Out of Food in the Last Year: Never true    Ran Out of Food in the Last Year: Never true  Transportation Needs: No Transportation Needs (01/09/2022)   PRAPARE - Administrator, Civil Service (Medical): No    Lack of Transportation (Non-Medical): No  Physical Activity: Sufficiently Active (01/09/2022)   Exercise Vital Sign    Days of Exercise per Week: 4 days    Minutes of Exercise per Session: 60 min  Stress: Stress Concern Present (01/09/2022)   [employer] of Occupational Health - Occupational Stress Questionnaire    Feeling of Stress : Rather much  Social Connections: Socially Integrated (01/09/2022)   Social  Connection and Isolation Panel    Frequency of Communication with Friends and Family: Once a week    Frequency of Social Gatherings with Friends and Family: Twice a week    Attends Religious Services: More than 4 times per year    Active Member of [employer] or Organizations: Yes    Attends Engineer, structural: Not on file    Marital Status: Married   Allergies  Allergen Reactions   Tramadol     Felt sick with this   Wellbutrin  [Bupropion ]     Muscle twitches   Clindamycin Hcl Rash   Family History  Problem Relation Age of Onset   Diabetes Mother    Arrhythmia Mother    High blood pressure Mother    Mesothelioma Father        asbestos exposure   Allergic rhinitis Father     Sinusitis Father    Healthy Sister    Post-traumatic stress disorder Son    Scoliosis Son    Brain cancer Maternal Aunt    Asthma Neg Hx    Eczema Neg Hx    Urticaria Neg Hx    Angioedema Neg Hx    Immunodeficiency Neg Hx    Atopy Neg Hx    Colon cancer Neg Hx    Esophageal cancer Neg Hx    Stomach cancer Neg Hx    Colon polyps Neg Hx      Current Outpatient Medications (Cardiovascular):    rosuvastatin  (CRESTOR ) 10 MG tablet, TAKE 1 TABLET AT BEDTIME  Current Outpatient Medications (Respiratory):    fluticasone  (FLONASE ) 50 MCG/ACT nasal spray, Place 2 sprays into both nostrils daily.   montelukast  (SINGULAIR ) 10 MG tablet, Take 1 tablet (10 mg total) by mouth at bedtime.  Current Outpatient Medications (Analgesics):    meloxicam  (MOBIC ) 7.5 MG tablet, Take 1 tablet (7.5 mg total) by mouth daily.  Current Outpatient Medications (Hematological):    vitamin B-12 (CYANOCOBALAMIN) 1000 MCG tablet, Take 1,000 mcg by mouth daily.  Current Outpatient Medications (Other):    CALCIUM  CITRATE PO, Take 600 mg by mouth daily.   esomeprazole  (NEXIUM ) 40 MG capsule, TAKE 1 CAPSULE DAILY   gabapentin  (NEURONTIN ) 100 MG capsule, TAKE 1 CAPSULE THREE TIMES A DAY   lidocaine -prilocaine  (EMLA ) cream, APPLY 1 APPLICATION TOPICALLY AS NEEDED   MAGNESIUM  PO, Take by mouth at bedtime.   methocarbamol  (ROBAXIN ) 500 MG tablet, Take 1 tablet (500 mg total) by mouth every 8 (eight) hours as needed for muscle spasms.   omeprazole (PRILOSEC) 40 MG capsule, 1 capsule 1/2 to 1 hour before morning meal Orally Once a day for 90 days   OVER THE COUNTER MEDICATION, Zeasorb powder   Psyllium (METAMUCIL PO), Take by mouth daily.   zolpidem  (AMBIEN  CR) 6.25 MG CR tablet, Take 1 tablet (6.25 mg total) by mouth at bedtime as needed for sleep.   Reviewed prior external information including notes and imaging from  primary care provider As well as notes that were available from care everywhere and other  healthcare systems.  Past medical history, social, surgical and family history all reviewed in electronic medical record.  No pertanent information unless stated regarding to the chief complaint.   Review of Systems:  No headache, visual changes, nausea, vomiting, diarrhea, constipation, dizziness, abdominal pain, skin rash, fevers, chills, night sweats, weight loss, swollen lymph nodes, body aches, joint swelling, chest pain, shortness of breath, mood changes. POSITIVE muscle aches  Objective  Blood pressure 110/72, pulse 75, height 5'  1 (1.549 m), weight 127 lb (57.6 kg), SpO2 97%.   General: No apparent distress alert and oriented x3 mood and affect normal, dressed appropriately.  HEENT: Pupils equal, extraocular movements intact  Respiratory: Patient's speak in full sentences and does not appear short of breath  Cardiovascular: No lower extremity edema, non tender, no erythema severe tenderness to palpation over the greater trochanteric area bilaterally.  After verbal consent patient was prepped with alcohol swab and with a 21-gauge 2 inch needle injected into the right greater trochanteric area with 2 cc of 0.5% Marcaine  and 1 cc of Kenalog  40 mg/mL.  No blood loss.  Band-Aid placed.  Postinjection instructions given   After verbal consent patient was prepped with alcohol swab and with a 21-gauge 2 inch needle injected into the left greater trochanteric area with 2 cc of 0.5% Marcaine  and 1 cc of Kenalog  40 mg/mL.  No blood loss.  Band-Aid placed.  Postinjection instructions given    Impression and Recommendations:     The above documentation has been reviewed and is accurate and complete Dorris Pierre M Kynzleigh Bandel, DO

## 2023-07-17 ENCOUNTER — Encounter: Payer: Self-pay | Admitting: Family Medicine

## 2023-07-17 ENCOUNTER — Ambulatory Visit: Admitting: Family Medicine

## 2023-07-17 VITALS — BP 110/72 | HR 75 | Ht 61.0 in | Wt 127.0 lb

## 2023-07-17 DIAGNOSIS — M7061 Trochanteric bursitis, right hip: Secondary | ICD-10-CM | POA: Diagnosis not present

## 2023-07-17 DIAGNOSIS — M7062 Trochanteric bursitis, left hip: Secondary | ICD-10-CM

## 2023-07-17 NOTE — Patient Instructions (Signed)
 Injections in hips today See you again in 2-3 months

## 2023-07-17 NOTE — Assessment & Plan Note (Signed)
 Chronic, with exacerbation.  Discussed with patient about icing regimen and home exercises.  Do still think that there is a chance for some radiation from the back.  Patient has lost weight and is at her goal weight at the moment.  Discussed with patient that I think she is doing well and hopefully this will be beneficial as well.  Discussed icing regimen and home exercises otherwise.  Follow-up again in 6 to 8 weeks.

## 2023-09-21 ENCOUNTER — Ambulatory Visit: Admitting: Podiatry

## 2023-09-26 ENCOUNTER — Ambulatory Visit (INDEPENDENT_AMBULATORY_CARE_PROVIDER_SITE_OTHER): Admitting: Podiatry

## 2023-09-26 DIAGNOSIS — M79674 Pain in right toe(s): Secondary | ICD-10-CM | POA: Diagnosis not present

## 2023-09-26 DIAGNOSIS — B351 Tinea unguium: Secondary | ICD-10-CM

## 2023-09-26 DIAGNOSIS — M79675 Pain in left toe(s): Secondary | ICD-10-CM | POA: Diagnosis not present

## 2023-09-26 DIAGNOSIS — E1142 Type 2 diabetes mellitus with diabetic polyneuropathy: Secondary | ICD-10-CM | POA: Diagnosis not present

## 2023-09-26 NOTE — Progress Notes (Signed)
  Subjective:  Patient ID: Renee Bauer, female    DOB: December 09, 1950,  MRN: [id number]  Chief Complaint  Patient presents with   Nail Problem    Pt stated that she is here to have her nails trimmed down    73 y.o. female returns for the above complaint.  Patient presents with thickened elongated dystrophic toenails x10.  Pain full to touch.  She is a diabetic with last A1c of 5.7.  She would like to have her nails debrided down.  She denies any other acute complaints.  Objective:  There were no vitals filed for this visit. Podiatric Exam: Vascular: dorsalis pedis and posterior tibial pulses are palpable bilateral. Capillary return is immediate. Temperature gradient is WNL. Skin turgor WNL  Sensorium: Normal Semmes Weinstein monofilament test. Normal tactile sensation bilaterally. Nail Exam: Pt has thick disfigured discolored nails with subungual debris noted bilateral entire nail hallux through fifth toenails.  Except left second digit due to previous total nail avulsion.  Pain on palpation to the nails. Ulcer Exam: There is no evidence of ulcer or pre-ulcerative changes or infection. Orthopedic Exam: Muscle tone and strength are WNL. No limitations in general ROM. No crepitus or effusions noted. HAV  B/L.  Hammer toes 2-5  B/L. Skin: No Porokeratosis. No infection or ulcers    Assessment & Plan:   No diagnosis found.       Patient was evaluated and treated and all questions answered.  Onychomycosis with pain  -Nails palliatively debrided as below. -Educated on self-care  Procedure: Nail Debridement Rationale: pain  Type of Debridement: manual, sharp debridement. Instrumentation: Nail nipper, rotary burr. Number of Nails: 9  Procedures and Treatment: Consent by patient was obtained for treatment procedures. The patient understood the discussion of treatment and procedures well. All questions were answered thoroughly reviewed. Debridement of mycotic and hypertrophic toenails,  1 through 5 bilateral and clearing of subungual debris. No ulceration, no infection noted.  Return Visit-Office Procedure: Patient instructed to return to the office for a follow up visit 3 months for continued evaluation and treatment.  Franky Blanch, DPM    No follow-ups on file.

## 2023-09-28 ENCOUNTER — Other Ambulatory Visit: Payer: Self-pay | Admitting: Family Medicine

## 2023-09-28 DIAGNOSIS — E1169 Type 2 diabetes mellitus with other specified complication: Secondary | ICD-10-CM

## 2023-10-17 NOTE — Progress Notes (Unsigned)
 [facility] [address] [place] [address] Phone: [phone] Subjective:   Renee Bauer, am serving as a scribe for Dr. Arthea Claudene.  I'm seeing this patient by the request  of:  Cindy Clotilda HERO, DO  CC: Bilateral hip pain  YEP:Dlagzrupcz  07/17/2023 Chronic, with exacerbation.  Discussed with patient about icing regimen and home exercises.  Do still think that there is a chance for some radiation from the back.  Patient has lost weight and is at her goal weight at the moment.  Discussed with patient that I think she is doing well and hopefully this will be beneficial as well.  Discussed icing regimen and home exercises otherwise.  Follow-up again in 6 to 8 weeks.      Update 10/18/2023 Renee Bauer is a 73 y.o. female coming in with complaint of B hip pain. Patient states that she is ready for injections today.  Patient states that the pain is severe enough starting to wake her up at night, starting to affect daily activities.      Past Medical History:  Diagnosis Date   Anxiety    Arthritis    Benign colon polyp 2018   Bursitis    Depression    Diabetes mellitus without complication (HCC)    GERD (gastroesophageal reflux disease)    Hyperlipidemia    Rosacea    Past Surgical History:  Procedure Laterality Date   ABDOMINAL HYSTERECTOMY     menorrhagia   BLADDER REPAIR     BREAST BIOPSY Left    BUNIONECTOMY Right    COLON RESECTION  2007   COLONOSCOPY     DILATION AND CURETTAGE OF UTERUS     TOTAL KNEE ARTHROPLASTY Left 09/29/2020   Social History   Socioeconomic History   Marital status: Married    Spouse name: Not on file   Number of children: 2   Years of education: Not on file   Highest education level: Associate degree: occupational, Scientist, product/process development, or vocational program  Occupational History   Occupation: Retired  Tobacco Use   Smoking status: Never    Passive exposure: Never   Smokeless tobacco: Never   Vaping Use   Vaping status: Never Used  Substance and Sexual Activity   Alcohol use: Yes    Comment: occasional wine   Drug use: Never   Sexual activity: Not Currently  Other Topics Concern   Not on file  Social History Narrative   Not on file   Social Drivers of Health   Financial Resource Strain: Low Risk  (01/09/2022)   Overall Financial Resource Strain (CARDIA)    Difficulty of Paying Living Expenses: Not hard at all  Food Insecurity: No Food Insecurity (01/09/2022)   Hunger Vital Sign    Worried About Running Out of Food in the Last Year: Never true    Ran Out of Food in the Last Year: Never true  Transportation Needs: No Transportation Needs (01/09/2022)   PRAPARE - Administrator, Civil Service (Medical): No    Lack of Transportation (Non-Medical): No  Physical Activity: Sufficiently Active (01/09/2022)   Exercise Vital Sign    Days of Exercise per Week: 4 days    Minutes of Exercise per Session: 60 min  Stress: Stress Concern Present (01/09/2022)   [employer] of Occupational Health - Occupational Stress Questionnaire    Feeling of Stress : Rather much  Social Connections: Socially Integrated (01/09/2022)   Social Connection and  Isolation Panel    Frequency of Communication with Friends and Family: Once a week    Frequency of Social Gatherings with Friends and Family: Twice a week    Attends Religious Services: More than 4 times per year    Active Member of [employer] or Organizations: Yes    Attends Engineer, structural: Not on file    Marital Status: Married   Allergies  Allergen Reactions   Tramadol     Felt sick with this   Wellbutrin  [Bupropion ]     Muscle twitches   Clindamycin Hcl Rash   Family History  Problem Relation Age of Onset   Diabetes Mother    Arrhythmia Mother    High blood pressure Mother    Mesothelioma Father        asbestos exposure   Allergic rhinitis Father    Sinusitis Father    Healthy Sister     Post-traumatic stress disorder Son    Scoliosis Son    Brain cancer Maternal Aunt    Asthma Neg Hx    Eczema Neg Hx    Urticaria Neg Hx    Angioedema Neg Hx    Immunodeficiency Neg Hx    Atopy Neg Hx    Colon cancer Neg Hx    Esophageal cancer Neg Hx    Stomach cancer Neg Hx    Colon polyps Neg Hx      Current Outpatient Medications (Cardiovascular):    rosuvastatin  (CRESTOR ) 10 MG tablet, TAKE 1 TABLET AT BEDTIME  Current Outpatient Medications (Respiratory):    fluticasone  (FLONASE ) 50 MCG/ACT nasal spray, Place 2 sprays into both nostrils daily.   montelukast  (SINGULAIR ) 10 MG tablet, Take 1 tablet (10 mg total) by mouth at bedtime.  Current Outpatient Medications (Analgesics):    meloxicam  (MOBIC ) 7.5 MG tablet, Take 1 tablet (7.5 mg total) by mouth daily.  Current Outpatient Medications (Hematological):    vitamin B-12 (CYANOCOBALAMIN) 1000 MCG tablet, Take 1,000 mcg by mouth daily.  Current Outpatient Medications (Other):    CALCIUM  CITRATE PO, Take 600 mg by mouth daily.   esomeprazole  (NEXIUM ) 40 MG capsule, TAKE 1 CAPSULE DAILY   gabapentin  (NEURONTIN ) 100 MG capsule, TAKE 1 CAPSULE THREE TIMES A DAY   lidocaine -prilocaine  (EMLA ) cream, APPLY 1 APPLICATION TOPICALLY AS NEEDED   MAGNESIUM  PO, Take by mouth at bedtime.   methocarbamol  (ROBAXIN ) 500 MG tablet, Take 1 tablet (500 mg total) by mouth every 8 (eight) hours as needed for muscle spasms.   omeprazole (PRILOSEC) 40 MG capsule, 1 capsule 1/2 to 1 hour before morning meal Orally Once a day for 90 days   OVER THE COUNTER MEDICATION, Zeasorb powder   Psyllium (METAMUCIL PO), Take by mouth daily.   zolpidem  (AMBIEN  CR) 6.25 MG CR tablet, Take 1 tablet (6.25 mg total) by mouth at bedtime as needed for sleep.   Reviewed prior external information including notes and imaging from  primary care provider As well as notes that were available from care everywhere and other healthcare systems.  Past medical history,  social, surgical and family history all reviewed in electronic medical record.  No pertanent information unless stated regarding to the chief complaint.   Review of Systems:  No headache, visual changes, nausea, vomiting, diarrhea, constipation, dizziness, abdominal pain, skin rash, fevers, chills, night sweats, weight loss, swollen lymph nodes, body aches, joint swelling, chest pain, shortness of breath, mood changes. POSITIVE muscle aches  Objective  Blood pressure 104/76, pulse 74, height 5' 1 (1.549  m), weight 129 lb (58.5 kg), SpO2 98%.   General: No apparent distress alert and oriented x3 mood and affect normal, dressed appropriately.  HEENT: Pupils equal, extraocular movements intact  Respiratory: Patient's speak in full sentences and does not appear short of breath  Cardiovascular: No lower extremity edema, non tender, no erythema  Bilateral hip severe tenderness to palpation bilaterally.  Seems to be over the greater trochanteric area.  Worsening pain with internal rotation.    After verbal consent patient was prepped with alcohol swab and with a 21-gauge 2 inch needle injected into the right greater trochanteric area with 2 cc of 0.5% Marcaine  and 1 cc of Kenalog  40 mg/mL.  No blood loss.  Band-Aid placed.  Postinjection instructions given   After verbal consent patient was prepped with alcohol swab and with a 21-gauge 2 inch needle injected into the left greater trochanteric area with 2 cc of 0.5% Marcaine  and 1 cc of Kenalog  40 mg/mL.  No blood loss.  Band-Aid placed.  Postinjection instructions given   Impression and Recommendations:    The above documentation has been reviewed and is accurate and complete Norvel Wenker M Valen Mascaro, DO

## 2023-10-18 ENCOUNTER — Encounter: Payer: Self-pay | Admitting: Family Medicine

## 2023-10-18 ENCOUNTER — Ambulatory Visit: Admitting: Family Medicine

## 2023-10-18 VITALS — BP 104/76 | HR 74 | Ht 61.0 in | Wt 129.0 lb

## 2023-10-18 DIAGNOSIS — M7061 Trochanteric bursitis, right hip: Secondary | ICD-10-CM | POA: Diagnosis not present

## 2023-10-18 DIAGNOSIS — M7062 Trochanteric bursitis, left hip: Secondary | ICD-10-CM | POA: Diagnosis not present

## 2023-10-18 NOTE — Patient Instructions (Addendum)
 B hip injections today See me again in 3-4 months

## 2023-10-18 NOTE — Assessment & Plan Note (Signed)
 After further evaluation the seem to have more of a bursitis again noted on the lateral aspect of the hip.  Discussed with patient about icing regimen, discussed with patient that there is a differential includes lumbar radiculopathy.  Patient still feels that this is more beneficial with the injections and given injections again.  Follow-up again in 10 to 12 weeks

## 2023-11-13 ENCOUNTER — Ambulatory Visit (INDEPENDENT_AMBULATORY_CARE_PROVIDER_SITE_OTHER)
Admission: RE | Admit: 2023-11-13 | Discharge: 2023-11-13 | Disposition: A | Discharge status: Home / Self Care | Source: Ambulatory Visit | Attending: Internal Medicine | Admitting: Internal Medicine

## 2023-11-13 ENCOUNTER — Telehealth: Payer: Self-pay | Admitting: Family Medicine

## 2023-11-13 DIAGNOSIS — M8580 Other specified disorders of bone density and structure, unspecified site: Secondary | ICD-10-CM

## 2023-11-13 NOTE — Telephone Encounter (Signed)
 Routing this information to provider office for further assistance for patient.   Copied from CRM #[id number]. Topic: Clinical - Medical Advice >> Nov 13, 2023 11:56 AM Wess RAMAN wrote: Reason for CRM: Patient took calcium  supplements and wasn't suppose to due to having bone density scan today. Patient would like to know if she should still come to her appt.  Callback #: [phone]

## 2023-11-19 ENCOUNTER — Other Ambulatory Visit: Payer: Self-pay | Admitting: Internal Medicine

## 2023-11-19 DIAGNOSIS — Z1231 Encounter for screening mammogram for malignant neoplasm of breast: Secondary | ICD-10-CM

## 2023-11-24 ENCOUNTER — Other Ambulatory Visit: Payer: Self-pay | Admitting: Podiatry

## 2023-12-05 ENCOUNTER — Ambulatory Visit (INDEPENDENT_AMBULATORY_CARE_PROVIDER_SITE_OTHER): Admitting: Podiatry

## 2023-12-05 DIAGNOSIS — M79675 Pain in left toe(s): Secondary | ICD-10-CM | POA: Diagnosis not present

## 2023-12-05 DIAGNOSIS — M79674 Pain in right toe(s): Secondary | ICD-10-CM

## 2023-12-05 DIAGNOSIS — B351 Tinea unguium: Secondary | ICD-10-CM

## 2023-12-05 NOTE — Progress Notes (Signed)
  Subjective:  Patient ID: Renee Bauer, female    DOB: 1950/03/31,  MRN: [id number]  Chief Complaint  Patient presents with   Nail Problem   73 y.o. female returns for the above complaint.  Patient presents with thickened elongated dystrophic toenails x10.  Pain full to touch.  She is a diabetic with last A1c of 5.7.  She would like to have her nails debrided down.  She denies any other acute complaints.  Objective:  There were no vitals filed for this visit. Podiatric Exam: Vascular: dorsalis pedis and posterior tibial pulses are palpable bilateral. Capillary return is immediate. Temperature gradient is WNL. Skin turgor WNL  Sensorium: Normal Semmes Weinstein monofilament test. Normal tactile sensation bilaterally. Nail Exam: Pt has thick disfigured discolored nails with subungual debris noted bilateral entire nail hallux through fifth toenails.  Except left second digit due to previous total nail avulsion.  Pain on palpation to the nails. Ulcer Exam: There is no evidence of ulcer or pre-ulcerative changes or infection. Orthopedic Exam: Muscle tone and strength are WNL. No limitations in general ROM. No crepitus or effusions noted. HAV  B/L.  Hammer toes 2-5  B/L. Skin: No Porokeratosis. No infection or ulcers    Assessment & Plan:   1. Pain due to onychomycosis of toenails of both feet          Patient was evaluated and treated and all questions answered.  Onychomycosis with pain  -Nails palliatively debrided as below. -Educated on self-care  Procedure: Nail Debridement Rationale: pain  Type of Debridement: manual, sharp debridement. Instrumentation: Nail nipper, rotary burr. Number of Nails: 9  Procedures and Treatment: Consent by patient was obtained for treatment procedures. The patient understood the discussion of treatment and procedures well. All questions were answered thoroughly reviewed. Debridement of mycotic and hypertrophic toenails, 1 through 5 bilateral  and clearing of subungual debris. No ulceration, no infection noted.  Return Visit-Office Procedure: Patient instructed to return to the office for a follow up visit 3 months for continued evaluation and treatment.  Franky Blanch, DPM    No follow-ups on file.

## 2023-12-30 ENCOUNTER — Other Ambulatory Visit: Payer: Self-pay | Admitting: Medical Genetics

## 2024-01-03 ENCOUNTER — Ambulatory Visit
Admission: RE | Admit: 2024-01-03 | Discharge: 2024-01-03 | Disposition: A | Source: Ambulatory Visit | Attending: Internal Medicine | Admitting: Internal Medicine

## 2024-01-03 DIAGNOSIS — Z1231 Encounter for screening mammogram for malignant neoplasm of breast: Secondary | ICD-10-CM

## 2024-01-16 NOTE — Progress Notes (Unsigned)
 [facility] [address] [place] [address] Phone: [phone] Subjective:   Renee Bauer, am serving as a scribe for Dr. Arthea Claudene.  I'm seeing this patient by the request  of:  Cindy Clotilda HERO, DO  CC: Bilateral hip pain  YEP:Dlagzrupcz  10/18/2023 After further evaluation the seem to have more of a bursitis again noted on the lateral aspect of the hip. Discussed with patient about icing regimen, discussed with patient that there is a differential includes lumbar radiculopathy. Patient still feels that this is more beneficial with the injections and given injections again. Follow-up again in 10 to 12 weeks   Update 01/17/2024 Renee Bauer is a 73 y.o. female coming in with complaint of B hip pain. Patient states that she has had some discomfort and noted.  Waking her up at night.  Affecting daily activities.      Past Medical History:  Diagnosis Date   Anxiety    Arthritis    Benign colon polyp 2018   Bursitis    Depression    Diabetes mellitus without complication (HCC)    GERD (gastroesophageal reflux disease)    Hyperlipidemia    Rosacea    Past Surgical History:  Procedure Laterality Date   ABDOMINAL HYSTERECTOMY     menorrhagia   BLADDER REPAIR     BREAST BIOPSY Left    BUNIONECTOMY Right    COLON RESECTION  2007   COLONOSCOPY     DILATION AND CURETTAGE OF UTERUS     TOTAL KNEE ARTHROPLASTY Left 09/29/2020   Social History   Socioeconomic History   Marital status: Married    Spouse name: Not on file   Number of children: 2   Years of education: Not on file   Highest education level: Associate degree: occupational, scientist, product/process development, or vocational program  Occupational History   Occupation: Retired  Tobacco Use   Smoking status: Never    Passive exposure: Never   Smokeless tobacco: Never  Vaping Use   Vaping status: Never Used  Substance and Sexual Activity   Alcohol use: Yes    Comment: occasional wine    Drug use: Never   Sexual activity: Not Currently  Other Topics Concern   Not on file  Social History Narrative   Not on file   Social Drivers of Health   Tobacco Use: Low Risk (10/24/2023)   Received from [facility]   Patient History    Smoking Tobacco Use: Never    Smokeless Tobacco Use: Never    Passive Exposure: Not on file  Financial Resource Strain: Low Risk (01/09/2022)   Overall Financial Resource Strain (CARDIA)    Difficulty of Paying Living Expenses: Not hard at all  Food Insecurity: Low Risk (10/24/2023)   Received from [facility]   Epic    Within the past 12 months, you worried that your food would run out before you got money to buy more: Never true    Within the past 12 months, the food you bought just didn't last and you didn't have money to get more. : Never true  Transportation Needs: No Transportation Needs (10/24/2023)   Received from [facility]    In the past 12 months, has lack of reliable transportation kept you from medical appointments, meetings, work or from getting things needed for daily living? : No  Physical Activity: Sufficiently Active (01/09/2022)   Exercise Vital Sign    Days of Exercise per  Week: 4 days    Minutes of Exercise per Session: 60 min  Stress: Stress Concern Present (01/09/2022)   [employer] of Occupational Health - Occupational Stress Questionnaire    Feeling of Stress : Rather much  Social Connections: Socially Integrated (01/09/2022)   Social Connection and Isolation Panel    Frequency of Communication with Friends and Family: Once a week    Frequency of Social Gatherings with Friends and Family: Twice a week    Attends Religious Services: More than 4 times per year    Active Member of [employer] or Organizations: Yes    Attends Banker Meetings: Not on file    Marital Status: Married  Depression (PHQ2-9): Low Risk (06/08/2022)   Depression (PHQ2-9)    PHQ-2 Score: 0  Alcohol Screen: Low  Risk (06/06/2021)   Alcohol Screen    Last Alcohol Screening Score (AUDIT): 1  Housing: Low Risk (10/24/2023)   Received from [facility]   Epic    What is your living situation today?: I have a steady place to live    Think about the place you live. Do you have problems with any of the following? Choose all that apply:: None/None on this list  Utilities: Low Risk (10/24/2023)   Received from [facility]   Utilities    In the past 12 months has the electric, gas, oil, or water company threatened to shut off services in your home? : No  Health Literacy: Not on file   Allergies[1] Family History  Problem Relation Age of Onset   Diabetes Mother    Arrhythmia Mother    High blood pressure Mother    Mesothelioma Father        asbestos exposure   Allergic rhinitis Father    Sinusitis Father    Healthy Sister    Post-traumatic stress disorder Son    Scoliosis Son    Brain cancer Maternal Aunt    Asthma Neg Hx    Eczema Neg Hx    Urticaria Neg Hx    Angioedema Neg Hx    Immunodeficiency Neg Hx    Atopy Neg Hx    Colon cancer Neg Hx    Esophageal cancer Neg Hx    Stomach cancer Neg Hx    Colon polyps Neg Hx     Current Outpatient Medications (Cardiovascular):    rosuvastatin  (CRESTOR ) 10 MG tablet, TAKE 1 TABLET AT BEDTIME  Current Outpatient Medications (Respiratory):    fluticasone  (FLONASE ) 50 MCG/ACT nasal spray, Place 2 sprays into both nostrils daily.   montelukast  (SINGULAIR ) 10 MG tablet, Take 1 tablet (10 mg total) by mouth at bedtime.  Current Outpatient Medications (Analgesics):    meloxicam  (MOBIC ) 7.5 MG tablet, Take 1 tablet (7.5 mg total) by mouth daily.  Current Outpatient Medications (Hematological):    vitamin B-12 (CYANOCOBALAMIN ) 1000 MCG tablet, Take 1,000 mcg by mouth daily.  Current Outpatient Medications (Other):    CALCIUM  CITRATE PO, Take 600 mg by mouth daily.   esomeprazole  (NEXIUM ) 40 MG capsule, TAKE 1 CAPSULE DAILY   gabapentin  (NEURONTIN )  100 MG capsule, TAKE 1 CAPSULE THREE TIMES A DAY   lidocaine -prilocaine  (EMLA ) cream, APPLY 1 APPLICATION TOPICALLY AS NEEDED   MAGNESIUM  PO, Take by mouth at bedtime.   methocarbamol  (ROBAXIN ) 500 MG tablet, Take 1 tablet (500 mg total) by mouth every 8 (eight) hours as needed for muscle spasms.   omeprazole (PRILOSEC) 40 MG capsule, 1 capsule 1/2 to 1 hour before morning meal Orally  Once a day for 90 days   OVER THE COUNTER MEDICATION, Zeasorb powder   Psyllium (METAMUCIL PO), Take by mouth daily.   zolpidem  (AMBIEN  CR) 6.25 MG CR tablet, Take 1 tablet (6.25 mg total) by mouth at bedtime as needed for sleep.   Reviewed prior external information including notes and imaging from  primary care provider As well as notes that were available from care everywhere and other healthcare systems.  Past medical history, social, surgical and family history all reviewed in electronic medical record.  No pertanent information unless stated regarding to the chief complaint.   Review of Systems:  No headache, visual changes, nausea, vomiting, diarrhea, constipation, dizziness, abdominal pain, skin rash, fevers, chills, night sweats, weight loss, swollen lymph nodes, body aches, joint swelling, chest pain, shortness of breath, mood changes. POSITIVE muscle aches  Objective  Blood pressure 112/86, pulse 87, height 5' 1 (1.549 m), weight 128 lb (58.1 kg), SpO2 98%.   General: No apparent distress alert and oriented x3 mood and affect normal, dressed appropriately.  HEENT: Pupils equal, extraocular movements intact  Respiratory: Patient's speak in full sentences and does not appear short of breath  Cardiovascular: No lower extremity edema, non tender, no erythema  Bilateral hips tender to palpation noted.  Seems to be over the greater trochanteric areas.  Negative straight leg test noted.   After verbal consent patient was prepped with alcohol swab and with a 21-gauge 2 inch needle injected into the  right greater trochanteric area with 2 cc of 0.5% Marcaine  and 1 cc of Kenalog  40 mg/mL.  No blood loss.  Band-Aid placed.  Postinjection instructions given   After verbal consent patient was prepped with alcohol swab and with a 21-gauge 2 inch needle injected into the left greater trochanteric area with 2 cc of 0.5% Marcaine  and 1 cc of Kenalog  40 mg/mL.  No blood loss.  Band-Aid placed.  Postinjection instructions given    Impression and Recommendations:      The above documentation has been reviewed and is accurate and complete Arthea CHRISTELLA Sharps, DO      [1]  Allergies Allergen Reactions   Tramadol     Felt sick with this   Wellbutrin  [Bupropion ]     Muscle twitches   Clindamycin Hcl Rash

## 2024-01-17 ENCOUNTER — Ambulatory Visit: Admitting: Family Medicine

## 2024-01-17 ENCOUNTER — Encounter: Payer: Self-pay | Admitting: Family Medicine

## 2024-01-17 VITALS — BP 112/86 | HR 87 | Ht 61.0 in | Wt 128.0 lb

## 2024-01-17 DIAGNOSIS — M7062 Trochanteric bursitis, left hip: Secondary | ICD-10-CM | POA: Diagnosis not present

## 2024-01-17 DIAGNOSIS — M7061 Trochanteric bursitis, right hip: Secondary | ICD-10-CM

## 2024-01-17 NOTE — Assessment & Plan Note (Signed)
 Bilateral injections given.  Discussed icing regimen and home exercises, which activities to do and which ones to avoid.  Increase activity slowly.  Discussed icing regimen.  Follow-up again in 6 to 8 weeks

## 2024-01-17 NOTE — Patient Instructions (Signed)
See me again in 3 months.

## 2024-01-30 ENCOUNTER — Other Ambulatory Visit

## 2024-01-30 DIAGNOSIS — Z006 Encounter for examination for normal comparison and control in clinical research program: Secondary | ICD-10-CM

## 2024-02-08 ENCOUNTER — Other Ambulatory Visit

## 2024-02-10 LAB — GENECONNECT MOLECULAR SCREEN: Genetic Analysis Overall Interpretation: NEGATIVE

## 2024-02-13 ENCOUNTER — Ambulatory Visit: Admitting: Podiatry

## 2024-02-13 DIAGNOSIS — M205X1 Other deformities of toe(s) (acquired), right foot: Secondary | ICD-10-CM

## 2024-02-13 NOTE — Progress Notes (Signed)
"  °  Subjective:  Patient ID: Renee Bauer, female    DOB: 1950/12/29,  MRN: [id number]  Chief Complaint  Patient presents with   Nail Problem    Nail trim    74 y.o. female returns today for planned flexor tenotomy of the right fourth digit.  Objective:  There were no vitals filed for this visit.  General AA&O x3. Normal mood and affect.  Vascular Pedal pulses palpable.  Neurologic Epicritic sensation grossly intact.  Dermatologic Pre-ulcerative callus at the tip of the right, 4th toe  Orthopedic: Semi-reducible hammertoe deformity right, 4th toe    Assessment & Plan:  Patient was evaluated and treated and all questions answered.  Hammertoe right fourth with pre-ulcerative callus -Flexor tenotomy as below. -Advised to remove the dressing in 24 hours and apply a band-aid and triple abx ointment every day thereafter.  Procedure: Flexor Tenotomy Indication for Procedure: toe with semi-reducible hammertoe with distal tip ulceration. Flexor tenotomy indicated to alleviate contracture, reduce pressure, and enhance healing of the ulceration. Location: right, 4th toe Anesthesia: Lidocaine  1% plain; 1.5 mL and Marcaine  0.5% plain; 1.5 mL digital block Instrumentation: 18 g needle  Technique: The toe was anesthetized as above and prepped in the usual fashion. The toe was exsanquinated and a tourniquet was secured at the base of the toe. An 18g needle was then used to percutaneously release the flexor tendon at the plantar surface of the toe with noted release of the hammertoe deformity. The incision was then dressed with antibiotic ointment and band-aid. Compression splint dressing applied. Patient tolerated the procedure well. Dressing: Dry, sterile, compression dressing. Disposition: Patient tolerated procedure well. Patient to return in 1 week for follow-up.      No follow-ups on file.    "

## 2024-04-17 ENCOUNTER — Ambulatory Visit: Admitting: Family Medicine

## 2024-04-23 ENCOUNTER — Ambulatory Visit: Admitting: Podiatry

## 2024-05-07 ENCOUNTER — Ambulatory Visit: Admitting: Rheumatology
